# Patient Record
Sex: Male | Born: 1940 | Race: White | Hispanic: No | Marital: Married | Smoking: Never smoker
Health system: Southern US, Community
[De-identification: ages and names within clinical notes are randomized; demographics above are authoritative.]

## PROBLEM LIST (undated history)

## (undated) DIAGNOSIS — I2510 Atherosclerotic heart disease of native coronary artery without angina pectoris: Secondary | ICD-10-CM

## (undated) DIAGNOSIS — I447 Left bundle-branch block, unspecified: Secondary | ICD-10-CM

## (undated) DIAGNOSIS — I213 ST elevation (STEMI) myocardial infarction of unspecified site: Secondary | ICD-10-CM

## (undated) DIAGNOSIS — E785 Hyperlipidemia, unspecified: Secondary | ICD-10-CM

## (undated) DIAGNOSIS — E119 Type 2 diabetes mellitus without complications: Secondary | ICD-10-CM

## (undated) HISTORY — PX: NO PAST SURGERIES: SHX2092

## (undated) HISTORY — DX: Left bundle-branch block, unspecified: I44.7

---

## 1998-05-21 ENCOUNTER — Ambulatory Visit: Admission: RE | Admit: 1998-05-21 | Discharge: 1998-05-21 | Payer: Self-pay | Admitting: Internal Medicine

## 2011-12-29 DIAGNOSIS — L259 Unspecified contact dermatitis, unspecified cause: Secondary | ICD-10-CM | POA: Diagnosis not present

## 2012-01-14 DIAGNOSIS — J309 Allergic rhinitis, unspecified: Secondary | ICD-10-CM | POA: Diagnosis not present

## 2013-05-28 ENCOUNTER — Other Ambulatory Visit: Payer: Self-pay

## 2013-05-28 ENCOUNTER — Inpatient Hospital Stay (HOSPITAL_COMMUNITY)
Admission: EM | Admit: 2013-05-28 | Discharge: 2013-05-31 | DRG: 249 | Disposition: A | Discharge status: Home / Self Care | Payer: Medicare Other | Attending: Cardiovascular Disease | Admitting: Cardiovascular Disease

## 2013-05-28 ENCOUNTER — Encounter (HOSPITAL_COMMUNITY)
Admission: EM | Disposition: A | Discharge status: Home / Self Care | Payer: Self-pay | Source: Home / Self Care | Attending: Cardiovascular Disease

## 2013-05-28 ENCOUNTER — Encounter (HOSPITAL_COMMUNITY): Payer: Self-pay | Admitting: *Deleted

## 2013-05-28 ENCOUNTER — Ambulatory Visit (HOSPITAL_COMMUNITY): Admit: 2013-05-28 | Payer: Self-pay | Admitting: Cardiovascular Disease

## 2013-05-28 DIAGNOSIS — I739 Peripheral vascular disease, unspecified: Secondary | ICD-10-CM | POA: Diagnosis not present

## 2013-05-28 DIAGNOSIS — Z7982 Long term (current) use of aspirin: Secondary | ICD-10-CM | POA: Diagnosis not present

## 2013-05-28 DIAGNOSIS — Z23 Encounter for immunization: Secondary | ICD-10-CM

## 2013-05-28 DIAGNOSIS — E119 Type 2 diabetes mellitus without complications: Secondary | ICD-10-CM | POA: Diagnosis present

## 2013-05-28 DIAGNOSIS — Z7902 Long term (current) use of antithrombotics/antiplatelets: Secondary | ICD-10-CM | POA: Diagnosis not present

## 2013-05-28 DIAGNOSIS — I2510 Atherosclerotic heart disease of native coronary artery without angina pectoris: Secondary | ICD-10-CM | POA: Diagnosis not present

## 2013-05-28 DIAGNOSIS — I959 Hypotension, unspecified: Secondary | ICD-10-CM | POA: Diagnosis not present

## 2013-05-28 DIAGNOSIS — I447 Left bundle-branch block, unspecified: Secondary | ICD-10-CM | POA: Diagnosis not present

## 2013-05-28 DIAGNOSIS — I2119 ST elevation (STEMI) myocardial infarction involving other coronary artery of inferior wall: Principal | ICD-10-CM | POA: Diagnosis present

## 2013-05-28 DIAGNOSIS — I498 Other specified cardiac arrhythmias: Secondary | ICD-10-CM | POA: Diagnosis present

## 2013-05-28 DIAGNOSIS — Z79899 Other long term (current) drug therapy: Secondary | ICD-10-CM

## 2013-05-28 DIAGNOSIS — I219 Acute myocardial infarction, unspecified: Secondary | ICD-10-CM | POA: Diagnosis not present

## 2013-05-28 DIAGNOSIS — Z01810 Encounter for preprocedural cardiovascular examination: Secondary | ICD-10-CM | POA: Diagnosis not present

## 2013-05-28 DIAGNOSIS — R079 Chest pain, unspecified: Secondary | ICD-10-CM | POA: Diagnosis not present

## 2013-05-28 DIAGNOSIS — R739 Hyperglycemia, unspecified: Secondary | ICD-10-CM | POA: Diagnosis present

## 2013-05-28 DIAGNOSIS — E785 Hyperlipidemia, unspecified: Secondary | ICD-10-CM | POA: Diagnosis present

## 2013-05-28 DIAGNOSIS — I252 Old myocardial infarction: Secondary | ICD-10-CM | POA: Diagnosis present

## 2013-05-28 DIAGNOSIS — I213 ST elevation (STEMI) myocardial infarction of unspecified site: Secondary | ICD-10-CM

## 2013-05-28 DIAGNOSIS — Z951 Presence of aortocoronary bypass graft: Secondary | ICD-10-CM | POA: Diagnosis present

## 2013-05-28 HISTORY — DX: Type 2 diabetes mellitus without complications: E11.9

## 2013-05-28 HISTORY — PX: PERCUTANEOUS CORONARY STENT INTERVENTION (PCI-S): SHX5485

## 2013-05-28 HISTORY — DX: Hyperlipidemia, unspecified: E78.5

## 2013-05-28 HISTORY — PX: LEFT HEART CATH: SHX5478

## 2013-05-28 LAB — CBC WITH DIFFERENTIAL/PLATELET
Basophils Absolute: 0 10*3/uL (ref 0.0–0.1)
Eosinophils Absolute: 0 10*3/uL (ref 0.0–0.7)
Eosinophils Relative: 0 % (ref 0–5)
HCT: 42.2 % (ref 39.0–52.0)
Lymphocytes Relative: 11 % — ABNORMAL LOW (ref 12–46)
MCH: 31.6 pg (ref 26.0–34.0)
MCHC: 35.1 g/dL (ref 30.0–36.0)
MCV: 90.2 fL (ref 78.0–100.0)
Monocytes Absolute: 0.6 10*3/uL (ref 0.1–1.0)
RDW: 12.7 % (ref 11.5–15.5)
WBC: 15.8 10*3/uL — ABNORMAL HIGH (ref 4.0–10.5)

## 2013-05-28 LAB — BASIC METABOLIC PANEL
BUN: 11 mg/dL (ref 6–23)
CO2: 20 mEq/L (ref 19–32)
Glucose, Bld: 204 mg/dL — ABNORMAL HIGH (ref 70–99)
Potassium: 3.8 mEq/L (ref 3.5–5.1)
Sodium: 135 mEq/L (ref 135–145)

## 2013-05-28 LAB — POCT I-STAT, CHEM 8
BUN: 10 mg/dL (ref 6–23)
Calcium, Ion: 1.09 mmol/L — ABNORMAL LOW (ref 1.13–1.30)
Chloride: 101 mEq/L (ref 96–112)
Creatinine, Ser: 1.1 mg/dL (ref 0.50–1.35)
Glucose, Bld: 208 mg/dL — ABNORMAL HIGH (ref 70–99)
HCT: 51 % (ref 39.0–52.0)
Hemoglobin: 17.3 g/dL — ABNORMAL HIGH (ref 13.0–17.0)
Potassium: 3.7 mEq/L (ref 3.5–5.1)
Sodium: 140 mEq/L (ref 135–145)
TCO2: 21 mmol/L (ref 0–100)

## 2013-05-28 LAB — DIFFERENTIAL
Eosinophils Relative: 2 % (ref 0–5)
Lymphs Abs: 7.1 10*3/uL — ABNORMAL HIGH (ref 0.7–4.0)
Monocytes Absolute: 0.8 10*3/uL (ref 0.1–1.0)
Monocytes Relative: 6 % (ref 3–12)
Neutro Abs: 5.3 10*3/uL (ref 1.7–7.7)

## 2013-05-28 LAB — CBC
HCT: 48.1 % (ref 39.0–52.0)
MCH: 32.9 pg (ref 26.0–34.0)
MCV: 92 fL (ref 78.0–100.0)
RBC: 5.23 MIL/uL (ref 4.22–5.81)
RDW: 12.7 % (ref 11.5–15.5)
WBC: 13.5 10*3/uL — ABNORMAL HIGH (ref 4.0–10.5)

## 2013-05-28 LAB — APTT: aPTT: 27 seconds (ref 24–37)

## 2013-05-28 LAB — POCT I-STAT TROPONIN I: Troponin i, poc: 0 ng/mL (ref 0.00–0.08)

## 2013-05-28 SURGERY — LEFT HEART CATH
Anesthesia: LOCAL

## 2013-05-28 MED ORDER — TICAGRELOR 90 MG PO TABS: 90.0000 mg | ORAL_TABLET | Freq: Two times a day (BID) | ORAL | Status: DC

## 2013-05-28 MED ORDER — ONDANSETRON HCL 4 MG/2ML IJ SOLN: 4.0000 mg | Freq: Four times a day (QID) | INTRAMUSCULAR | Status: DC | PRN

## 2013-05-28 MED ORDER — TICAGRELOR 90 MG PO TABS
90.0000 mg | ORAL_TABLET | Freq: Two times a day (BID) | ORAL | Status: DC
  Filled 2013-05-28: qty 1

## 2013-05-28 MED ORDER — SODIUM CHLORIDE 0.9 % IV SOLN
INTRAVENOUS | Status: AC
  Administered 2013-05-28: 1000 mL via INTRAVENOUS

## 2013-05-28 MED ORDER — HEPARIN SODIUM (PORCINE) 5000 UNIT/ML IJ SOLN
5000.0000 [IU] | Freq: Three times a day (TID) | INTRAMUSCULAR | Status: DC
  Administered 2013-05-29 – 2013-05-31 (×8): 5000 [IU] via SUBCUTANEOUS
  Filled 2013-05-28 (×10): qty 1

## 2013-05-28 MED ORDER — METOPROLOL TARTRATE 12.5 MG HALF TABLET
12.5000 mg | ORAL_TABLET | Freq: Two times a day (BID) | ORAL | Status: DC
  Administered 2013-05-28 – 2013-05-31 (×6): 12.5 mg via ORAL
  Filled 2013-05-28 (×7): qty 1

## 2013-05-28 MED ORDER — ASPIRIN 81 MG PO CHEW: 81.0000 mg | CHEWABLE_TABLET | Freq: Every day | ORAL | Status: DC

## 2013-05-28 MED ORDER — ATORVASTATIN CALCIUM 80 MG PO TABS
80.0000 mg | ORAL_TABLET | Freq: Every day | ORAL | Status: DC
  Administered 2013-05-29 – 2013-05-30 (×2): 80 mg via ORAL
  Filled 2013-05-28 (×3): qty 1

## 2013-05-28 MED ORDER — NITROGLYCERIN IN D5W 200-5 MCG/ML-% IV SOLN: 2.0000 ug/min | INTRAVENOUS | Status: DC

## 2013-05-28 MED ORDER — HEPARIN SODIUM (PORCINE) 5000 UNIT/ML IJ SOLN
INTRAMUSCULAR | Status: AC
  Administered 2013-05-28: 4000 [IU]
  Filled 2013-05-28: qty 1

## 2013-05-28 MED ORDER — ATORVASTATIN CALCIUM 40 MG PO TABS: 40.0000 mg | ORAL_TABLET | Freq: Every day | ORAL | Status: DC

## 2013-05-28 MED ORDER — TICAGRELOR 90 MG PO TABS
ORAL_TABLET | ORAL | Status: AC
  Filled 2013-05-28: qty 2

## 2013-05-28 MED ORDER — ACETAMINOPHEN 325 MG PO TABS: 650.0000 mg | ORAL_TABLET | ORAL | Status: DC | PRN

## 2013-05-28 MED ORDER — ZOLPIDEM TARTRATE 5 MG PO TABS: 5.0000 mg | ORAL_TABLET | Freq: Every evening | ORAL | Status: DC | PRN

## 2013-05-28 MED ORDER — HEPARIN (PORCINE) IN NACL 2-0.9 UNIT/ML-% IJ SOLN
INTRAMUSCULAR | Status: AC
  Filled 2013-05-28: qty 1000

## 2013-05-28 MED ORDER — NITROGLYCERIN IN D5W 200-5 MCG/ML-% IV SOLN
INTRAVENOUS | Status: AC
  Filled 2013-05-28: qty 250

## 2013-05-28 MED ORDER — MORPHINE SULFATE 2 MG/ML IJ SOLN: 1.0000 mg | INTRAMUSCULAR | Status: DC | PRN

## 2013-05-28 MED ORDER — TICAGRELOR 90 MG PO TABS
90.0000 mg | ORAL_TABLET | Freq: Two times a day (BID) | ORAL | Status: DC
  Administered 2013-05-29 – 2013-05-31 (×5): 90 mg via ORAL
  Filled 2013-05-28 (×6): qty 1

## 2013-05-28 MED ORDER — SODIUM CHLORIDE 0.9 % IJ SOLN
3.0000 mL | Freq: Two times a day (BID) | INTRAMUSCULAR | Status: DC
  Administered 2013-05-28 – 2013-05-30 (×4): 3 mL via INTRAVENOUS
  Administered 2013-05-30: 09:00:00 via INTRAVENOUS

## 2013-05-28 MED ORDER — ZOLPIDEM TARTRATE 5 MG PO TABS
5.0000 mg | ORAL_TABLET | Freq: Every evening | ORAL | Status: DC | PRN
  Administered 2013-05-29 – 2013-05-30 (×2): 5 mg via ORAL
  Filled 2013-05-28 (×2): qty 1

## 2013-05-28 MED ORDER — LIDOCAINE HCL (PF) 1 % IJ SOLN
INTRAMUSCULAR | Status: AC
  Filled 2013-05-28: qty 30

## 2013-05-28 MED ORDER — NITROGLYCERIN 0.4 MG SL SUBL: 0.4000 mg | SUBLINGUAL_TABLET | SUBLINGUAL | Status: DC | PRN

## 2013-05-28 MED ORDER — SODIUM CHLORIDE 0.9 % IJ SOLN: 3.0000 mL | INTRAMUSCULAR | Status: DC | PRN

## 2013-05-28 MED ORDER — BIVALIRUDIN 250 MG IV SOLR
INTRAVENOUS | Status: AC
  Filled 2013-05-28: qty 250

## 2013-05-28 MED ORDER — ASPIRIN EC 81 MG PO TBEC
81.0000 mg | DELAYED_RELEASE_TABLET | Freq: Every day | ORAL | Status: DC
  Administered 2013-05-29 – 2013-05-31 (×3): 81 mg via ORAL
  Filled 2013-05-28 (×3): qty 1

## 2013-05-28 MED ORDER — SODIUM CHLORIDE 0.9 % IV SOLN: 250.0000 mL | INTRAVENOUS | Status: DC | PRN

## 2013-05-28 NOTE — CV Procedure (Addendum)
Steve Hickman is a 72 y.o. male    [id number] LOCATION:  FACILITY: [facility]  PHYSICIAN: Nanetta Batty, M.D. Jan 26, 1941   DATE OF PROCEDURE:  05/28/2013  DATE OF DISCHARGE:   CARDIAC CATHETERIZATION     History obtained from chart review. Steve Hickman is a 72 year old married Caucasian male with no prior cardiac history. He has no primary care physician nor is he on any medications. He developed substernal chest pain and hour prior to presentation to the hospital where he was found to have inferolateral ST segment elevation. A code STEMI was called and the patient was brought urgently to the cath lab for angiography and intervention.   PROCEDURE DESCRIPTION:    The patient was brought to the second floor [facility] lab in the postabsorptive state. He was not premedicated . His right groinwas prepped and shaved in usual sterile fashion. Xylocaine 1% was used for local anesthesia. A 6 French sheath was inserted into the right common femoral artery using standard Seldinger technique. 6 French right and left Judkins diagnostic catheters along with a 6 French pigtail catheter were used for selective coronary angiography, left ventriculography, subselective left internal mammary artery angiography and distal abdominal aortography. Visipaque dye was used for the entirety of the case. Retrograde aortic, left ventricular and pullback pressures were recorded.   HEMODYNAMICS:    AO SYSTOLIC/AO DIASTOLIC: 169/96   LV SYSTOLIC/LV DIASTOLIC: 166/24  ANGIOGRAPHIC RESULTS:   1. Left main; normal  2. LAD; tandem 95 stenoses in the proximal portion. This was a relatively small caliber vessel. 3. Left circumflex; codominant with a 99% thrombotic appearing stenosis in the proximal portion straddling the first moderate-sized obtuse marginal branch.  4. Right coronary artery; codominant with a 95% mid stenosis followed by a long 80% stenosis at the "crux". 5.LIMA TO LAD; subselectively visualized  and was widely patent. He was suitable for use during coronary artery bypass grafting if necessary 6. Left ventriculography; RAO left ventriculogram was performed using  25 mL of Visipaque dye at 12 mL/second. The overall LVEF estimated  60 %  Without wall motion abnormalities 7, distal abdominal aortography: Renal artery redrawing patent. 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 He did have TIMI 3 flow and was hemodynamically stable. He has high grade proximal segmental LAD disease as well as codominant RCA disease. I do not think his LAD extendable. I'm going to proceed with PCI and stenting of his proximal circumflex with bare-metal stent, aspirin, and Brilenta. Will perform aspiration thrombectomy first.  Procedure description: using a 6 French XB 3.5 cm guide catheter along with an 014/190 cm Asahi  pro-water guidewire and a 2.0 mm x 12 mm long balloon predilatation was performed. Following this a priority one aspiration thrombectomy device was used with multiple passes during which I aspirated the entirety of the thrombotic load. I then stented the lesion with a 3 mm x 15 mm long vision bare metal stent at 14 atmospheres (3.25 mm) resulting in reduction of a 95% stenosis to 0% residual with TIMI-3 flow. There was a "step up/step down" with preservation of the marginal branch which was encompassed within the stented segment. Because of this I decided not to post dilate.  Final impression: Successful PCI and stenting of proximal codominant circumflex with aspiration thrombectomy using bare-metal stent. The patient was treated with aspirin and Brilenta. Angiomax was reduced to 25 mg per kilogram  per 4 hours. The patient will ultimately need revascularization of the remaining 2 vessels optimally by bypass grafting.  Runell Gess MD,  [facility] 05/28/2013 9:48 PM

## 2013-05-28 NOTE — ED Notes (Signed)
Blood drawn and sent to lab.

## 2013-05-28 NOTE — ED Notes (Signed)
Wife at bedside. Pt ready for cath lab

## 2013-05-28 NOTE — ED Notes (Signed)
Procedure explained to pt by Dr. Allyson Sabal

## 2013-05-28 NOTE — ED Notes (Signed)
Vital signs stable. 

## 2013-05-28 NOTE — H&P (Addendum)
Admit date: 05/28/2013 Referring Physician EMS Primary Cardiologist NONE Chief complaint/reason for admission: Chest pain  HPI: This is a 72yo WM with no PMH, on no meds and has not seen any physician in years who presented to the ER with an inferior STEMI.  He started having chest pain after eating dinner and called EMS.  He was noted to have at least 4mm of ST elevation in the inferior leads.  In the ER he continued to complain of chest pain with persistent elevation in ST segments.  He has some transient bradycardia.    PMH:   No past medical history on file.  PSH:   No past surgical history on file.  ALLERGIES:   Review of patient's allergies indicates no known allergies.  Prior to Admit Meds:   No prescriptions prior to admission   Family HX:   No family history on file. Social HX:    History   Social History  . Marital Status: Single    Spouse Name: N/A    Number of Children: N/A  . Years of Education: N/A   Occupational History  . Not on file.   Social History Main Topics  . Smoking status: None  . Smokeless tobacco: Not on file  . Alcohol Use: Not on file  . Drug Use: Not on file  . Sexually Active: Not on file   Other Topics Concern  . Not on file   Social History Narrative  . No narrative on file     ROS:  All 11 ROS were addressed and are negative except what is stated in the HPI  PHYSICAL EXAM Filed Vitals:   05/28/13 2024  BP: 150/85  Pulse: 62  Temp:   Resp:    General: Well developed, well nourished, in no acute distress Head: Eyes PERRLA, No xanthomas.   Normal cephalic and atramatic  Lungs:   Clear bilaterally to auscultation and percussion. Heart:   HRRR S1 S2 Pulses are 2+ & equal.            No carotid bruit. No JVD.  No abdominal bruits. No femoral bruits. Abdomen: Bowel sounds are positive, abdomen soft and non-tender without masses Extremities:   No clubbing, cyanosis or edema.  DP +1 Neuro: Alert and oriented X 3. Psych:  Good affect,  responds appropriately   Labs:   Lab Results  Component Value Date   WBC 13.5* 05/28/2013   HGB 17.3* 05/28/2013   HCT 51.0 05/28/2013   MCV 92.0 05/28/2013   PLT 261 05/28/2013    Recent Labs Lab 05/28/13 2027  NA 140  K 3.7  CL 101  BUN 10  CREATININE 1.10  GLUCOSE 208*   No results found for this basename: CKTOTAL, CKMB, CKMBINDEX, TROPONINI   No results found for this basename: PTT   Lab Results  Component Value Date   INR 1.00 05/28/2013       Radiology: Pending  EKG:  NSR with 4 mm of ST elevation in the inferior leads  ASSESSMENT:  1.  Inferior STEMI  PLAN:   1.  Admit to CCU 2.  Emergent cath by Dr. Allyson Sabal 3.  ASA/ACE I/Statin 4.  Lopressor 12.5mg  BID 5.  2D echo in am 6.  Statin panel/HbgA1C  Quintella Reichert, MD  05/28/2013  9:04 PM

## 2013-05-28 NOTE — ED Notes (Signed)
Pt c/o worsening shob.  NBR in place

## 2013-05-28 NOTE — ED Notes (Signed)
Pt remains alert,  MD at bedside

## 2013-05-28 NOTE — ED Notes (Signed)
Pt states his pain is easing and that his shob is better at present time.  EKG repeated.

## 2013-05-28 NOTE — H&P (Signed)
    Pt was reexamined and existing H & P reviewed. No changes found.  Runell Gess, MD [facility] 05/28/2013 9:45 PM

## 2013-05-28 NOTE — ED Notes (Signed)
Pt alert and oriented on arrival to ED. Rates pain at 3/10. Has been given ASa 325mg  by EMS pta

## 2013-05-28 NOTE — ED Notes (Signed)
Pt to Cath lab via stretcher

## 2013-05-28 NOTE — ED Notes (Signed)
Wife to bedside. All pt belongings given to wife.

## 2013-05-28 NOTE — ED Notes (Signed)
Pt slight pale and diaphoretic. MD at bedside explaining Cardiac cath to pt. Pt in agreement for procedure

## 2013-05-28 NOTE — ED Notes (Signed)
Looking for pt family in waiting room

## 2013-05-28 NOTE — ED Notes (Signed)
Dr Berry to bedside.

## 2013-05-28 NOTE — ED Notes (Signed)
Belongings placed in bag Glasses and wallet in bag to give to pt

## 2013-05-28 NOTE — ED Notes (Signed)
Pt had synopal episode at home and c/o chest pressure.

## 2013-05-29 DIAGNOSIS — Z951 Presence of aortocoronary bypass graft: Secondary | ICD-10-CM

## 2013-05-29 DIAGNOSIS — I447 Left bundle-branch block, unspecified: Secondary | ICD-10-CM | POA: Diagnosis present

## 2013-05-29 DIAGNOSIS — R739 Hyperglycemia, unspecified: Secondary | ICD-10-CM | POA: Diagnosis present

## 2013-05-29 DIAGNOSIS — I2510 Atherosclerotic heart disease of native coronary artery without angina pectoris: Secondary | ICD-10-CM

## 2013-05-29 DIAGNOSIS — Z23 Encounter for immunization: Secondary | ICD-10-CM | POA: Diagnosis not present

## 2013-05-29 DIAGNOSIS — I2119 ST elevation (STEMI) myocardial infarction involving other coronary artery of inferior wall: Secondary | ICD-10-CM | POA: Diagnosis not present

## 2013-05-29 DIAGNOSIS — I252 Old myocardial infarction: Secondary | ICD-10-CM

## 2013-05-29 DIAGNOSIS — I959 Hypotension, unspecified: Secondary | ICD-10-CM | POA: Diagnosis not present

## 2013-05-29 DIAGNOSIS — I498 Other specified cardiac arrhythmias: Secondary | ICD-10-CM | POA: Diagnosis not present

## 2013-05-29 DIAGNOSIS — I219 Acute myocardial infarction, unspecified: Secondary | ICD-10-CM | POA: Diagnosis not present

## 2013-05-29 DIAGNOSIS — I213 ST elevation (STEMI) myocardial infarction of unspecified site: Secondary | ICD-10-CM

## 2013-05-29 HISTORY — DX: Old myocardial infarction: I25.2

## 2013-05-29 HISTORY — DX: Atherosclerotic heart disease of native coronary artery without angina pectoris: I25.10

## 2013-05-29 HISTORY — DX: ST elevation (STEMI) myocardial infarction of unspecified site: I21.3

## 2013-05-29 HISTORY — DX: Presence of aortocoronary bypass graft: Z95.1

## 2013-05-29 LAB — TROPONIN I
Troponin I: 2.56 ng/mL (ref <0.30)
Troponin I: 14.8 ng/mL (ref <0.30)
Troponin I: 9.94 ng/mL (ref <0.30)

## 2013-05-29 LAB — COMPREHENSIVE METABOLIC PANEL
Albumin: 3.7 g/dL (ref 3.5–5.2)
BUN: 11 mg/dL (ref 6–23)
CO2: 22 mEq/L (ref 19–32)
Calcium: 9.2 mg/dL (ref 8.4–10.5)
Chloride: 98 mEq/L (ref 96–112)
Creatinine, Ser: 0.82 mg/dL (ref 0.50–1.35)
GFR calc non Af Amer: 85 mL/min — ABNORMAL LOW (ref >90)
Total Bilirubin: 0.4 mg/dL (ref 0.3–1.2)

## 2013-05-29 LAB — BASIC METABOLIC PANEL
GFR calc Af Amer: >90 mL/min (ref >90)
GFR calc non Af Amer: 84 mL/min — ABNORMAL LOW (ref >90)
Potassium: 4.1 mEq/L (ref 3.5–5.1)
Sodium: 136 mEq/L (ref 135–145)

## 2013-05-29 LAB — HEMOGLOBIN A1C
Hgb A1c MFr Bld: 6.9 % — ABNORMAL HIGH (ref <5.7)
Mean Plasma Glucose: 151 mg/dL — ABNORMAL HIGH (ref <117)

## 2013-05-29 LAB — GLUCOSE, CAPILLARY
Glucose-Capillary: 146 mg/dL — ABNORMAL HIGH (ref 70–99)
Glucose-Capillary: 111 mg/dL — ABNORMAL HIGH (ref 70–99)
Glucose-Capillary: 157 mg/dL — ABNORMAL HIGH (ref 70–99)

## 2013-05-29 LAB — PROTIME-INR: Prothrombin Time: 20.8 seconds — ABNORMAL HIGH (ref 11.6–15.2)

## 2013-05-29 LAB — CBC
MCHC: 35 g/dL (ref 30.0–36.0)
RDW: 12.8 % (ref 11.5–15.5)

## 2013-05-29 LAB — MRSA PCR SCREENING: MRSA by PCR: NEGATIVE

## 2013-05-29 MED ORDER — TRAMADOL HCL 50 MG PO TABS: 50.0000 mg | ORAL_TABLET | Freq: Four times a day (QID) | ORAL | Status: DC | PRN

## 2013-05-29 MED ORDER — ISOSORBIDE MONONITRATE ER 30 MG PO TB24
30.0000 mg | ORAL_TABLET | Freq: Every day | ORAL | Status: DC
  Administered 2013-05-29 – 2013-05-31 (×3): 30 mg via ORAL
  Filled 2013-05-29 (×3): qty 1

## 2013-05-29 MED ORDER — INSULIN ASPART 100 UNIT/ML ~~LOC~~ SOLN
0.0000 [IU] | Freq: Three times a day (TID) | SUBCUTANEOUS | Status: DC
  Administered 2013-05-29: 1 [IU] via SUBCUTANEOUS
  Administered 2013-05-30: 2 [IU] via SUBCUTANEOUS
  Administered 2013-05-30 – 2013-05-31 (×4): 1 [IU] via SUBCUTANEOUS
  Administered 2013-05-31: 2 [IU] via SUBCUTANEOUS

## 2013-05-29 MED ORDER — ATROPINE SULFATE 1 MG/ML IJ SOLN
INTRAMUSCULAR | Status: AC
  Filled 2013-05-29: qty 1

## 2013-05-29 MED ORDER — INSULIN ASPART 100 UNIT/ML ~~LOC~~ SOLN: 0.0000 [IU] | Freq: Every day | SUBCUTANEOUS | Status: DC

## 2013-05-29 MED ORDER — PANTOPRAZOLE SODIUM 40 MG PO TBEC
40.0000 mg | DELAYED_RELEASE_TABLET | Freq: Every day | ORAL | Status: DC
  Administered 2013-05-29 – 2013-05-31 (×3): 40 mg via ORAL
  Filled 2013-05-29 (×3): qty 1

## 2013-05-29 NOTE — Progress Notes (Signed)
Pulled sheath from right groin at 0500. At 0508, patient moved causing groin to start bleeding heavily. BP dropped down to the 90's systolic. Hemostasis was achieved again and pressure was held for 30 more minutes. Right groin is soft and no hematoma is noted. Pressure dressing applied. No oozing noted at this time. Right groin is a level 0. Strict bedrest and post sheath removal instructions given to patient and he acknowledged understanding.

## 2013-05-29 NOTE — Progress Notes (Signed)
Subjective:  No chest pain  Objective:  Vital Signs in the last 24 hours: Temp:  [98 F (36.7 C)-98.8 F (37.1 C)] 98 F (36.7 C) (08/03 0730) Pulse Rate:  [62-88] 72 (08/03 0730) Resp:  [15-21] 18 (08/03 0730) BP: (113-162)/(67-98) 125/74 mmHg (08/03 0730) SpO2:  [96 %-100 %] 98 % (08/03 0730) Arterial Line BP: (124-178)/(68-95) 124/68 mmHg (08/03 0400) Weight:  [150 lb (68.04 kg)-154 lb 1.6 oz (69.9 kg)] 152 lb 8.9 oz (69.2 kg) (08/03 0600)  Intake/Output from previous day:  Intake/Output Summary (Last 24 hours) at 05/29/13 0921 Last data filed at 05/29/13 0700  Gross per 24 hour  Intake    652 ml  Output   1200 ml  Net   -548 ml    Physical Exam: General appearance: alert, cooperative and no distress Lungs: clear to auscultation bilaterally Heart: regular rate and rhythm Rt groin without hematoma   Rate: 72  Rhythm: normal sinus rhythm  Lab Results:  Recent Labs  05/28/13 2300 05/29/13 0400  WBC 15.8* 14.0*  HGB 14.8 14.1  PLT 248 251    Recent Labs  05/28/13 2300 05/29/13 0400  NA 132* 136  K 4.1 4.1  CL 98 103  CO2 22 23  GLUCOSE 223* 188*  BUN 11 11  CREATININE 0.82 0.85    Recent Labs  05/28/13 2300 05/29/13 0414  TROPONINI 2.56* 14.80*   Hepatic Function Panel  Recent Labs  05/28/13 2300  PROT 7.1  ALBUMIN 3.7  AST 33  ALT 30  ALKPHOS 77  BILITOT 0.4   No results found for this basename: CHOL,  in the last 72 hours  Recent Labs  05/28/13 2300  INR 1.85*    Imaging: No results found.  Cardiac Studies:  Assessment/Plan:   Principal Problem:   STEMI (ST elevation myocardial infarction)- CFX BMS Active Problems:   CAD (coronary artery disease)- residual CAD, needs CABG, (EF 60%)   Hyperglycemia   LBBB (left bundle branch block)    PLAN: Check Hgb A1C, sliding scale coverage, po Nitrates, check lipid panel in am. Check CXR tomorrow, notify CVTS tomorrow.   Corine Shelter PA-C [facility] [phone] 05/29/2013, 9:21  AM   Agree with note written by Corine Shelter [facility]  S/P STEMI, PCI/Stent LCX with BMS. Looks great!! No CP/SOB. Exam benign. Labs OK. Prob out to tele tomorrow. Will get TCTS to see in consult tomorrow. Staged elective CABG LAD/RCA after 4-6 weeks of DAPT.  Runell Gess 05/29/2013 3:44 PM

## 2013-05-30 ENCOUNTER — Inpatient Hospital Stay (HOSPITAL_COMMUNITY): Payer: Medicare Other

## 2013-05-30 ENCOUNTER — Other Ambulatory Visit: Payer: Self-pay | Admitting: *Deleted

## 2013-05-30 DIAGNOSIS — Z23 Encounter for immunization: Secondary | ICD-10-CM | POA: Diagnosis not present

## 2013-05-30 DIAGNOSIS — Z01810 Encounter for preprocedural cardiovascular examination: Secondary | ICD-10-CM

## 2013-05-30 DIAGNOSIS — I959 Hypotension, unspecified: Secondary | ICD-10-CM | POA: Diagnosis not present

## 2013-05-30 DIAGNOSIS — I2510 Atherosclerotic heart disease of native coronary artery without angina pectoris: Secondary | ICD-10-CM

## 2013-05-30 DIAGNOSIS — I498 Other specified cardiac arrhythmias: Secondary | ICD-10-CM | POA: Diagnosis not present

## 2013-05-30 DIAGNOSIS — I2119 ST elevation (STEMI) myocardial infarction involving other coronary artery of inferior wall: Secondary | ICD-10-CM | POA: Diagnosis not present

## 2013-05-30 DIAGNOSIS — I219 Acute myocardial infarction, unspecified: Secondary | ICD-10-CM | POA: Diagnosis not present

## 2013-05-30 DIAGNOSIS — I447 Left bundle-branch block, unspecified: Secondary | ICD-10-CM | POA: Diagnosis not present

## 2013-05-30 LAB — HEMOGLOBIN A1C
Hgb A1c MFr Bld: 7.1 % — ABNORMAL HIGH (ref <5.7)
Mean Plasma Glucose: 157 mg/dL — ABNORMAL HIGH (ref <117)

## 2013-05-30 LAB — GLUCOSE, CAPILLARY: Glucose-Capillary: 131 mg/dL — ABNORMAL HIGH (ref 70–99)

## 2013-05-30 LAB — BASIC METABOLIC PANEL
BUN: 12 mg/dL (ref 6–23)
CO2: 24 mEq/L (ref 19–32)
Calcium: 8.9 mg/dL (ref 8.4–10.5)
Chloride: 103 mEq/L (ref 96–112)
Creatinine, Ser: 0.98 mg/dL (ref 0.50–1.35)
GFR calc Af Amer: >90 mL/min (ref >90)
GFR calc non Af Amer: 79 mL/min — ABNORMAL LOW (ref >90)
Glucose, Bld: 139 mg/dL — ABNORMAL HIGH (ref 70–99)
Potassium: 3.8 mEq/L (ref 3.5–5.1)
Sodium: 137 mEq/L (ref 135–145)

## 2013-05-30 LAB — LIPID PANEL
Cholesterol: 138 mg/dL (ref 0–200)
HDL: 34 mg/dL — ABNORMAL LOW (ref >39)
LDL Cholesterol: 81 mg/dL (ref 0–99)
Total CHOL/HDL Ratio: 4.1 RATIO
Triglycerides: 116 mg/dL (ref <150)
VLDL: 23 mg/dL (ref 0–40)

## 2013-05-30 LAB — CBC
HCT: 37.6 % — ABNORMAL LOW (ref 39.0–52.0)
Hemoglobin: 13.1 g/dL (ref 13.0–17.0)
MCH: 32 pg (ref 26.0–34.0)
MCHC: 34.8 g/dL (ref 30.0–36.0)
MCV: 91.9 fL (ref 78.0–100.0)
Platelets: 224 10*3/uL (ref 150–400)
RBC: 4.09 MIL/uL — ABNORMAL LOW (ref 4.22–5.81)
RDW: 12.9 % (ref 11.5–15.5)
WBC: 11.6 10*3/uL — ABNORMAL HIGH (ref 4.0–10.5)

## 2013-05-30 LAB — PULMONARY FUNCTION TEST

## 2013-05-30 MED FILL — Sodium Chloride IV Soln 0.9%: INTRAVENOUS | Qty: 50 | Status: AC

## 2013-05-30 NOTE — Progress Notes (Signed)
CARDIAC REHAB PHASE I   PRE:  Rate/Rhythm: 81SR  BP:  Supine: 109/68  Sitting:   Standing:    SaO2:   MODE:  Ambulation: 350 ft   POST:  Rate/Rhythm: 91SR  BP:  Supine:   Sitting: 142/76  Standing:    SaO2:  1015-1115 Pt walked 350 ft on RA with steady gait. Denied CP. Tolerated well. To recliner after walk. Began MI education and gave pt diabetic and heart healthy diets. Pt stated he was told 30 years ago when he tried to get in the service that he was diabetic, but he never followed up.  Pt encouraged to watch videos about carb counting and I began ed. He could benefit from re enforcement. Gave brililnta and stent booklets. Will follow up tomorrow. Axle Parfait RNBSN   Luetta Nutting, RN BSN  05/30/2013 11:08 AM   101

## 2013-05-30 NOTE — Care Management Note (Addendum)
    Page 1 of 1   05/31/2013     10:07:07 AM   CARE MANAGEMENT NOTE 05/31/2013  Patient:  Steve Hickman, Steve Hickman   Account Number:  [id number]  Date Initiated:  05/30/2013  Documentation initiated by:  Junius Creamer  Subjective/Objective Assessment:   adm w mi     Action/Plan:   lives alone   Anticipated DC Date:     Anticipated DC Plan:        DC Planning Services  CM consult  Medication Assistance      Choice offered to / List presented to:             Status of service:   Medicare Important Message given?   (If response is "NO", the following Medicare IM given date fields will be blank) Date Medicare IM given:   Date Additional Medicare IM given:    Discharge Disposition:    Per UR Regulation:  Reviewed for med. necessity/level of care/duration of stay  If discussed at Long Length of Stay Meetings, dates discussed:    Comments:  8/5 1004 debbie Jazzelle Zhang rn,bsn spoke w pt. he [facility] [place]. he does not have ins for meds. he never signed up for medicare part d. left pt assist form for brilinta on chart to see if he can get thru drug company if needed.  8/4 1106a debbie Timber Marshman rn,bsn card rehab nse gave pt brilinta 30days free and copay assist card.

## 2013-05-30 NOTE — Consult Note (Signed)
Reason for Consult:3 vessel CAD, s/p STEMI, PTCA of circumflex Referring Physician: Dr. Nanetta Batty  Steve Hickman is an 72 y.o. male.  HPI: 72 yo presented with a cc/o "passing out."  72 yo male with no known significant PMH and no history of cardiac disease. He was in his usual state of health until Saturday. Shortly after eating dinner he developed a strange sensation in his chest and some discomfort in his arms. He says he felt dizzy and laid his head on the table then had a brief LOC. He was brought to the ED and found to have an inferior STEMI. 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 He had thrombus in the circumflex. Dr. Allyson Sabal performed angioplasty and placed a bare metal stent. He is currently on Brillinta.   He has not seen a doctor in many years. He says he was told in his 30s that his sugars were a little high but hasn't had any issues with until this admission. After promting from his wife, he says he has had a couple of episodes of chest discomfort while walking his dog. These resolved quickly with rest. He denies fatigue or decreased energy, but his wife says he has had both for about 9 months.  He has been pain free since cath/ PCI.  Past Medical History  Diagnosis Date  . Medical history non-contributory   Possible glucose intolerance  Past Surgical History  Procedure Laterality Date  . No past surgeries      History reviewed. No pertinent family history.  Social History:  reports that he has never smoked. He does not have any smokeless tobacco history on file. He reports that he drinks about 0.6 ounces of alcohol per week. His drug history is not on file.  Allergies: No Known Allergies  Medications:  Scheduled: . aspirin EC  81 mg Oral Daily  . atorvastatin  80 mg Oral q1800  . heparin  5,000 Units Subcutaneous Q8H  . insulin aspart  0-5 Units Subcutaneous QHS  . insulin aspart  0-9 Units  Subcutaneous TID WC  . isosorbide mononitrate  30 mg Oral Daily  . metoprolol tartrate  12.5 mg Oral BID  . pantoprazole  40 mg Oral Q0600  . sodium chloride  3 mL Intravenous Q12H  . Ticagrelor  90 mg Oral BID    Results for orders placed during the hospital encounter of 05/28/13 (from the past 48 hour(s))  CBC     Status: Abnormal   Collection Time    05/28/13  8:21 PM      Result Value Range   WBC 13.5 (*) 4.0 - 10.5 K/uL   RBC 5.23  4.22 - 5.81 MIL/uL   Hemoglobin 17.2 (*) 13.0 - 17.0 g/dL   HCT 16.1  09.6 - 04.5 %   MCV 92.0  78.0 - 100.0 fL   MCH 32.9  26.0 - 34.0 pg   MCHC 35.8  30.0 - 36.0 g/dL   RDW 40.9  81.1 - 91.4 %   Platelets 261  150 - 400 K/uL  DIFFERENTIAL     Status: Abnormal   Collection Time    05/28/13  8:21 PM      Result Value Range   Neutrophils Relative % 39 (*) 43 - 77 %   Lymphocytes Relative 53 (*) 12 - 46 %   Monocytes Relative 6  3 - 12 %  Eosinophils Relative 2  0 - 5 %   Basophils Relative 0  0 - 1 %   Neutro Abs 5.3  1.7 - 7.7 K/uL   Lymphs Abs 7.1 (*) 0.7 - 4.0 K/uL   Monocytes Absolute 0.8  0.1 - 1.0 K/uL   Eosinophils Absolute 0.3  0.0 - 0.7 K/uL   Basophils Absolute 0.0  0.0 - 0.1 K/uL  PROTIME-INR     Status: None   Collection Time    05/28/13  8:21 PM      Result Value Range   Prothrombin Time 13.0  11.6 - 15.2 seconds   INR 1.00  0.00 - 1.49  APTT     Status: None   Collection Time    05/28/13  8:21 PM      Result Value Range   aPTT 27  24 - 37 seconds  BASIC METABOLIC PANEL     Status: Abnormal   Collection Time    05/28/13  8:21 PM      Result Value Range   Sodium 135  135 - 145 mEq/L   Potassium 3.8  3.5 - 5.1 mEq/L   Chloride 96  96 - 112 mEq/L   CO2 20  19 - 32 mEq/L   Glucose, Bld 204 (*) 70 - 99 mg/dL   BUN 11  6 - 23 mg/dL   Creatinine, Ser 1.19  0.50 - 1.35 mg/dL   Calcium 14.7  8.4 - 82.9 mg/dL   GFR calc non Af Amer 69 (*) >90 mL/min   GFR calc Af Amer 81 (*) >90 mL/min   Comment:            The eGFR has  been calculated     using the CKD EPI equation.     This calculation has not been     validated in all clinical     situations.     eGFR's persistently     <90 mL/min signify     possible Chronic Kidney Disease.  POCT I-STAT TROPONIN I     Status: None   Collection Time    05/28/13  8:24 PM      Result Value Range   Troponin i, poc 0.00  0.00 - 0.08 ng/mL   Comment 3            Comment: Due to the release kinetics of cTnI,     a negative result within the first hours     of the onset of symptoms does not rule out     myocardial infarction with certainty.     If myocardial infarction is still suspected,     repeat the test at appropriate intervals.  POCT I-STAT, CHEM 8     Status: Abnormal   Collection Time    05/28/13  8:27 PM      Result Value Range   Sodium 140  135 - 145 mEq/L   Potassium 3.7  3.5 - 5.1 mEq/L   Chloride 101  96 - 112 mEq/L   BUN 10  6 - 23 mg/dL   Creatinine, Ser 5.62  0.50 - 1.35 mg/dL   Glucose, Bld 130 (*) 70 - 99 mg/dL   Calcium, Ion 8.65 (*) 1.13 - 1.30 mmol/L   TCO2 21  0 - 100 mmol/L   Hemoglobin 17.3 (*) 13.0 - 17.0 g/dL   HCT 78.4  69.6 - 29.5 %  MRSA PCR SCREENING     Status: None   Collection Time  05/28/13 10:30 PM      Result Value Range   MRSA by PCR NEGATIVE  NEGATIVE   Comment:            The GeneXpert MRSA Assay (FDA     approved for NASAL specimens     only), is one component of a     comprehensive MRSA colonization     surveillance program. It is not     intended to diagnose MRSA     infection nor to guide or     monitor treatment for     MRSA infections.  TROPONIN I     Status: Abnormal   Collection Time    05/28/13 11:00 PM      Result Value Range   Troponin I 2.56 (*) <0.30 ng/mL   Comment:            Due to the release kinetics of cTnI,     a negative result within the first hours     of the onset of symptoms does not rule out     myocardial infarction with certainty.     If myocardial infarction is still  suspected,     repeat the test at appropriate intervals.     CRITICAL RESULT CALLED TO, READ BACK BY AND VERIFIED WITH:     Berdine Addison ON [id number] AT [phone]  BY [facility]  PROTIME-INR     Status: Abnormal   Collection Time    05/28/13 11:00 PM      Result Value Range   Prothrombin Time 20.8 (*) 11.6 - 15.2 seconds   INR 1.85 (*) 0.00 - 1.49  APTT     Status: Abnormal   Collection Time    05/28/13 11:00 PM      Result Value Range   aPTT 100 (*) 24 - 37 seconds   Comment:            IF BASELINE aPTT IS ELEVATED,     SUGGEST PATIENT RISK ASSESSMENT     BE USED TO DETERMINE APPROPRIATE     ANTICOAGULANT THERAPY.  CBC WITH DIFFERENTIAL     Status: Abnormal   Collection Time    05/28/13 11:00 PM      Result Value Range   WBC 15.8 (*) 4.0 - 10.5 K/uL   RBC 4.68  4.22 - 5.81 MIL/uL   Hemoglobin 14.8  13.0 - 17.0 g/dL   HCT 91.4  78.2 - 95.6 %   MCV 90.2  78.0 - 100.0 fL   MCH 31.6  26.0 - 34.0 pg   MCHC 35.1  30.0 - 36.0 g/dL   RDW 21.3  08.6 - 57.8 %   Platelets 248  150 - 400 K/uL   Neutrophils Relative % 85 (*) 43 - 77 %   Neutro Abs 13.5 (*) 1.7 - 7.7 K/uL   Lymphocytes Relative 11 (*) 12 - 46 %   Lymphs Abs 1.7  0.7 - 4.0 K/uL   Monocytes Relative 4  3 - 12 %   Monocytes Absolute 0.6  0.1 - 1.0 K/uL   Eosinophils Relative 0  0 - 5 %   Eosinophils Absolute 0.0  0.0 - 0.7 K/uL   Basophils Relative 0  0 - 1 %   Basophils Absolute 0.0  0.0 - 0.1 K/uL  TSH     Status: None   Collection Time    05/28/13 11:00 PM      Result Value Range   TSH 1.900  0.350 - 4.500 uIU/mL  COMPREHENSIVE METABOLIC PANEL     Status: Abnormal   Collection Time    05/28/13 11:00 PM      Result Value Range   Sodium 132 (*) 135 - 145 mEq/L   Potassium 4.1  3.5 - 5.1 mEq/L   Chloride 98  96 - 112 mEq/L   CO2 22  19 - 32 mEq/L   Glucose, Bld 223 (*) 70 - 99 mg/dL   BUN 11  6 - 23 mg/dL   Creatinine, Ser 1.61  0.50 - 1.35 mg/dL   Calcium 9.2  8.4 - 09.6 mg/dL   Total Protein 7.1  6.0 - 8.3 g/dL    Albumin 3.7  3.5 - 5.2 g/dL   AST 33  0 - 37 U/L   ALT 30  0 - 53 U/L   Alkaline Phosphatase 77  39 - 117 U/L   Total Bilirubin 0.4  0.3 - 1.2 mg/dL   GFR calc non Af Amer 85 (*) >90 mL/min   GFR calc Af Amer >90  >90 mL/min   Comment:            The eGFR has been calculated     using the CKD EPI equation.     This calculation has not been     validated in all clinical     situations.     eGFR's persistently     <90 mL/min signify     possible Chronic Kidney Disease.  HEMOGLOBIN A1C     Status: Abnormal   Collection Time    05/28/13 11:00 PM      Result Value Range   Hemoglobin A1C 6.9 (*) <5.7 %   Comment: (NOTE)                                                                               According to the ADA Clinical Practice Recommendations for 2011, when     HbA1c is used as a screening test:      >=6.5%   Diagnostic of Diabetes Mellitus               (if abnormal result is confirmed)     5.7-6.4%   Increased risk of developing Diabetes Mellitus     References:Diagnosis and Classification of Diabetes Mellitus,Diabetes     Care,2011,34(Suppl 1):S62-S69 and Standards of Medical Care in             Diabetes - 2011,Diabetes Care,2011,34 (Suppl 1):S11-S61.   Mean Plasma Glucose 151 (*) <117 mg/dL  CBC     Status: Abnormal   Collection Time    05/29/13  4:00 AM      Result Value Range   WBC 14.0 (*) 4.0 - 10.5 K/uL   RBC 4.41  4.22 - 5.81 MIL/uL   Hemoglobin 14.1  13.0 - 17.0 g/dL   HCT 04.5  40.9 - 81.1 %   MCV 91.4  78.0 - 100.0 fL   MCH 32.0  26.0 - 34.0 pg   MCHC 35.0  30.0 - 36.0 g/dL   RDW 91.4  78.2 - 95.6 %   Platelets 251  150 - 400 K/uL  BASIC METABOLIC PANEL  Status: Abnormal   Collection Time    05/29/13  4:00 AM      Result Value Range   Sodium 136  135 - 145 mEq/L   Potassium 4.1  3.5 - 5.1 mEq/L   Chloride 103  96 - 112 mEq/L   CO2 23  19 - 32 mEq/L   Glucose, Bld 188 (*) 70 - 99 mg/dL   BUN 11  6 - 23 mg/dL   Creatinine, Ser 1.61  0.50 -  1.35 mg/dL   Calcium 9.0  8.4 - 09.6 mg/dL   GFR calc non Af Amer 84 (*) >90 mL/min   GFR calc Af Amer >90  >90 mL/min   Comment:            The eGFR has been calculated     using the CKD EPI equation.     This calculation has not been     validated in all clinical     situations.     eGFR's persistently     <90 mL/min signify     possible Chronic Kidney Disease.  TROPONIN I     Status: Abnormal   Collection Time    05/29/13  4:14 AM      Result Value Range   Troponin I 14.80 (*) <0.30 ng/mL   Comment:            Due to the release kinetics of cTnI,     a negative result within the first hours     of the onset of symptoms does not rule out     myocardial infarction with certainty.     If myocardial infarction is still suspected,     repeat the test at appropriate intervals.     CRITICAL VALUE NOTED.  VALUE IS CONSISTENT WITH PREVIOUSLY REPORTED AND CALLED VALUE.  TROPONIN I     Status: Abnormal   Collection Time    05/29/13 11:55 AM      Result Value Range   Troponin I 9.94 (*) <0.30 ng/mL   Comment:            Due to the release kinetics of cTnI,     a negative result within the first hours     of the onset of symptoms does not rule out     myocardial infarction with certainty.     If myocardial infarction is still suspected,     repeat the test at appropriate intervals.     CRITICAL VALUE NOTED.  VALUE IS CONSISTENT WITH PREVIOUSLY REPORTED AND CALLED VALUE.  GLUCOSE, CAPILLARY     Status: Abnormal   Collection Time    05/29/13 12:33 PM      Result Value Range   Glucose-Capillary 146 (*) 70 - 99 mg/dL  GLUCOSE, CAPILLARY     Status: Abnormal   Collection Time    05/29/13  4:59 PM      Result Value Range   Glucose-Capillary 111 (*) 70 - 99 mg/dL  GLUCOSE, CAPILLARY     Status: Abnormal   Collection Time    05/29/13  9:46 PM      Result Value Range   Glucose-Capillary 157 (*) 70 - 99 mg/dL  HEMOGLOBIN E4V     Status: Abnormal   Collection Time    05/30/13  4:50  AM      Result Value Range   Hemoglobin A1C 7.1 (*) <5.7 %   Comment: (NOTE)  According to the ADA Clinical Practice Recommendations for 2011, when     HbA1c is used as a screening test:      >=6.5%   Diagnostic of Diabetes Mellitus               (if abnormal result is confirmed)     5.7-6.4%   Increased risk of developing Diabetes Mellitus     References:Diagnosis and Classification of Diabetes Mellitus,Diabetes     Care,2011,34(Suppl 1):S62-S69 and Standards of Medical Care in             Diabetes - 2011,Diabetes Care,2011,34 (Suppl 1):S11-S61.   Mean Plasma Glucose 157 (*) <117 mg/dL  BASIC METABOLIC PANEL     Status: Abnormal   Collection Time    05/30/13  4:50 AM      Result Value Range   Sodium 137  135 - 145 mEq/L   Potassium 3.8  3.5 - 5.1 mEq/L   Chloride 103  96 - 112 mEq/L   CO2 24  19 - 32 mEq/L   Glucose, Bld 139 (*) 70 - 99 mg/dL   BUN 12  6 - 23 mg/dL   Creatinine, Ser 1.61  0.50 - 1.35 mg/dL   Calcium 8.9  8.4 - 09.6 mg/dL   GFR calc non Af Amer 79 (*) >90 mL/min   GFR calc Af Amer >90  >90 mL/min   Comment:            The eGFR has been calculated     using the CKD EPI equation.     This calculation has not been     validated in all clinical     situations.     eGFR's persistently     <90 mL/min signify     possible Chronic Kidney Disease.  CBC     Status: Abnormal   Collection Time    05/30/13  4:50 AM      Result Value Range   WBC 11.6 (*) 4.0 - 10.5 K/uL   RBC 4.09 (*) 4.22 - 5.81 MIL/uL   Hemoglobin 13.1  13.0 - 17.0 g/dL   HCT 04.5 (*) 40.9 - 81.1 %   MCV 91.9  78.0 - 100.0 fL   MCH 32.0  26.0 - 34.0 pg   MCHC 34.8  30.0 - 36.0 g/dL   RDW 91.4  78.2 - 95.6 %   Platelets 224  150 - 400 K/uL  LIPID PANEL     Status: Abnormal   Collection Time    05/30/13  4:50 AM      Result Value Range   Cholesterol 138  0 - 200 mg/dL   Triglycerides 213  <086 mg/dL   HDL 34 (*) >57  mg/dL   Total CHOL/HDL Ratio 4.1     VLDL 23  0 - 40 mg/dL   LDL Cholesterol 81  0 - 99 mg/dL   Comment:            Total Cholesterol/HDL:CHD Risk     Coronary Heart Disease Risk Table                         Men   Women      1/2 Average Risk   3.4   3.3      Average Risk       5.0   4.4      2 X Average Risk   9.6   7.1  3 X Average Risk  23.4   11.0                Use the calculated Patient Ratio     above and the CHD Risk Table     to determine the patient's CHD Risk.                ATP III CLASSIFICATION (LDL):      <100     mg/dL   Optimal      960-454  mg/dL   Near or Above                        Optimal      130-159  mg/dL   Borderline      098-119  mg/dL   High      >147     mg/dL   Very High  GLUCOSE, CAPILLARY     Status: Abnormal   Collection Time    05/30/13  7:41 AM      Result Value Range   Glucose-Capillary 131 (*) 70 - 99 mg/dL   Comment 1 Notify RN    GLUCOSE, CAPILLARY     Status: Abnormal   Collection Time    05/30/13 11:49 AM      Result Value Range   Glucose-Capillary 170 (*) 70 - 99 mg/dL   Comment 1 Notify RN      Dg Chest 2 View  05/30/2013   *RADIOLOGY REPORT*  Clinical Data: Myocardial infarction  CHEST - 2 VIEW  Comparison:  None.  Findings:  The heart size and mediastinal contours are within normal limits.  Both lungs are clear.  The visualized skeletal structures are unremarkable.  IMPRESSION: No active cardiopulmonary disease.   Original Report Authenticated By: Christiana Pellant, M.D.    Review of Systems  Constitutional: Positive for malaise/fatigue. Negative for fever and chills.  Respiratory: Positive for shortness of breath. Negative for cough.   Cardiovascular: Positive for chest pain. Negative for orthopnea and claudication.  Gastrointestinal: Negative for heartburn, nausea and vomiting.  Neurological: Negative.  Negative for weakness.  Endo/Heme/Allergies: Negative.   All other systems reviewed and are negative.   Blood  pressure 102/57, pulse 89, temperature 97.9 F (36.6 C), temperature source Oral, resp. rate 18, height 5\' 7"  (1.702 m), weight 149 lb 14.6 oz (68 kg), SpO2 94.00%. Physical Exam  Vitals reviewed. Constitutional: He appears well-developed and well-nourished. No distress.  HENT:  Head: Normocephalic and atraumatic.  Poor dentition  Eyes: EOM are normal. Pupils are equal, round, and reactive to light.  Neck: Neck supple. No thyromegaly present.  Cardiovascular: Normal rate, regular rhythm, normal heart sounds and intact distal pulses.  Exam reveals no gallop and no friction rub.   No murmur heard. Respiratory: Effort normal and breath sounds normal. He has no wheezes. He has no rales.  GI: Soft. There is no tenderness.  Musculoskeletal: He exhibits no edema.  Neurological: No cranial nerve deficit.  Skin: Skin is warm and dry.    CARDIAC CATHETERIZATION HEMODYNAMICS:  AO SYSTOLIC/AO DIASTOLIC: 169/96  LV SYSTOLIC/LV DIASTOLIC: 166/24  ANGIOGRAPHIC RESULTS:  1. Left main; normal  2. LAD; tandem 95 stenoses in the proximal portion. This was a relatively small caliber vessel.  3. Left circumflex; codominant with a 99% thrombotic appearing stenosis in the proximal portion straddling the first moderate-sized obtuse marginal branch.  4. Right coronary artery; codominant with a 95% mid stenosis followed by a long 80%  stenosis at the "crux".  5.LIMA TO LAD; subselectively visualized and was widely patent. He was suitable for use during coronary artery bypass grafting if necessary  6. Left ventriculography; RAO left ventriculogram was performed using  25 mL of Visipaque dye at 12 mL/second. The overall LVEF estimated  60 % Without wall motion abnormalities  7, distal abdominal aortography: Renal artery redrawing patent. The infrarenal abdominal aorta and iliac bifurcation appear free of significant atherosclerotic changes  IMPRESSION:Mr. Diaz has a high-grade subtotal thrombotic proximal  codominant circumflex. He did have TIMI 3 flow and was hemodynamically stable. He has high grade proximal segmental LAD disease as well as codominant RCA disease. I do not think his LAD extendable. I'm going to proceed with PCI and stenting of his proximal circumflex with bare-metal stent, aspirin, and Brilenta. Will perform aspiration thrombectomy first.     Assessment/Plan: 72 yo WM with no prior cardiac history presented on 8/2 with a STEMI. He had a thrombus in the circumflex which was treated with a bare metal stent. He still has preserved LV function. He has residual hemodynamically significant disease in the LAD and the co-dominate RCA. I agree with Dr. Allyson Sabal that these vessels would best be treated with CABG once it is safe to temporarily hold the Brillinta.  I have discussed with the patient and his wife the general nature of the procedure, the need for general anesthesia, and the incisions to be used. We discussed the expected hospital stay, overall recovery and short and long term outcomes. They understand the risks include, but are not limited to, death, stroke, MI, DVT/PE, bleeding, possible need for transfusion, infections, cardiac arrhythmias, and other organ system dysfunction including respiratory, renal, or GI complications. He accepts these risks and agrees to proceed with CABG after interval recovery from his MI.  I will arrange followup in my office in 3-4 weeks to meet with them again in case they have any questions.  Amera Banos C 05/30/2013, 2:39 PM

## 2013-05-30 NOTE — Progress Notes (Signed)
Subjective: No further CP.  No SOB.  Objective: Vital signs in last 24 hours: Temp:  [97.3 F (36.3 C)-98.8 F (37.1 C)] 97.3 F (36.3 C) (08/04 0738) Pulse Rate:  [69-79] 74 (08/04 0410) Resp:  [18-19] 18 (08/04 0738) BP: (97-141)/(57-82) 102/57 mmHg (08/04 0410) SpO2:  [92 %-98 %] 94 % (08/04 0738) Weight:  [149 lb 14.6 oz (68 kg)] 149 lb 14.6 oz (68 kg) (08/04 0500) Last BM Date: 05/29/13  Intake/Output from previous day: 08/03 0701 - 08/04 0700 In: 240 [P.O.:240] Out: -  Intake/Output this shift:    Medications Current Facility-Administered Medications  Medication Dose Route Frequency Provider Last Rate Last Dose  . 0.9 %  sodium chloride infusion  250 mL Intravenous PRN Quintella Reichert, MD      . acetaminophen (TYLENOL) tablet 650 mg  650 mg Oral Q4H PRN Quintella Reichert, MD      . acetaminophen (TYLENOL) tablet 650 mg  650 mg Oral Q4H PRN Runell Gess, MD      . aspirin EC tablet 81 mg  81 mg Oral Daily Quintella Reichert, MD   81 mg at 05/29/13 1001  . atorvastatin (LIPITOR) tablet 80 mg  80 mg Oral q1800 Runell Gess, MD   80 mg at 05/29/13 1722  . heparin injection 5,000 Units  5,000 Units Subcutaneous Q8H Quintella Reichert, MD   5,000 Units at 05/30/13 [phone]  . insulin aspart (novoLOG) injection 0-5 Units  0-5 Units Subcutaneous QHS Luke K Kilroy, PA-C      . insulin aspart (novoLOG) injection 0-9 Units  0-9 Units Subcutaneous TID WC Abelino Derrick, PA-C   1 Units at 05/29/13 1243  . isosorbide mononitrate (IMDUR) 24 hr tablet 30 mg  30 mg Oral Daily Abelino Derrick, PA-C   30 mg at 05/29/13 1243  . metoprolol tartrate (LOPRESSOR) tablet 12.5 mg  12.5 mg Oral BID Quintella Reichert, MD   12.5 mg at 05/29/13 2139  . morphine 2 MG/ML injection 1 mg  1 mg Intravenous Q1H PRN Runell Gess, MD      . nitroGLYCERIN (NITROSTAT) SL tablet 0.4 mg  0.4 mg Sublingual Q5 Min x 3 PRN Quintella Reichert, MD      . ondansetron (ZOFRAN) injection 4 mg  4 mg Intravenous Q6H PRN Quintella Reichert, MD      . ondansetron (ZOFRAN) injection 4 mg  4 mg Intravenous Q6H PRN Runell Gess, MD      . pantoprazole (PROTONIX) EC tablet 40 mg  40 mg Oral Q0600 Abelino Derrick, PA-C   40 mg at 05/30/13 9604  . sodium chloride 0.9 % injection 3 mL  3 mL Intravenous Q12H Quintella Reichert, MD   3 mL at 05/29/13 2140  . sodium chloride 0.9 % injection 3 mL  3 mL Intravenous PRN Quintella Reichert, MD      . Ticagrelor (BRILINTA) tablet 90 mg  90 mg Oral BID Runell Gess, MD   90 mg at 05/29/13 2136  . traMADol (ULTRAM) tablet 50 mg  50 mg Oral Q6H PRN Abelino Derrick, PA-C      . zolpidem (AMBIEN) tablet 5 mg  5 mg Oral QHS PRN Runell Gess, MD   5 mg at 05/29/13 2143    PE: General appearance: alert, cooperative and no distress Lungs: clear to auscultation bilaterally Heart: regular rate and rhythm, S1, S2 normal, no murmur, click, rub  or gallop Extremities: No LEE Pulses: 2+ and symmetric Skin: Right goir:  Nontender, small hematoma, small area of ecchymosis. Neurologic: Grossly normal  Lab Results:   Recent Labs  05/28/13 2300 05/29/13 0400 05/30/13 0450  WBC 15.8* 14.0* 11.6*  HGB 14.8 14.1 13.1  HCT 42.2 40.3 37.6*  PLT 248 251 224   BMET  Recent Labs  05/28/13 2300 05/29/13 0400 05/30/13 0450  NA 132* 136 137  K 4.1 4.1 3.8  CL 98 103 103  CO2 22 23 24   GLUCOSE 223* 188* 139*  BUN 11 11 12   CREATININE 0.82 0.85 0.98  CALCIUM 9.2 9.0 8.9   PT/INR  Recent Labs  05/28/13 2021 05/28/13 2300  LABPROT 13.0 20.8*  INR 1.00 1.85*   Cholesterol  Recent Labs  05/30/13 0450  CHOL 138   Lipid Panel     Component Value Date/Time   CHOL 138 05/30/2013 0450   TRIG 116 05/30/2013 0450   HDL 34* 05/30/2013 0450   CHOLHDL 4.1 05/30/2013 0450   VLDL 23 05/30/2013 0450   LDLCALC 81 05/30/2013 0450    Cardiac Panel (last 3 results)  Recent Labs  05/28/13 2300 05/29/13 0414 05/29/13 1155  TROPONINI 2.56* 14.80* 9.94*     Assessment/Plan  Principal Problem:    STEMI (ST elevation myocardial infarction)- CFX BMS Active Problems:   CAD (coronary artery disease)- residual CAD, needs CABG, (EF 60%)   LBBB (left bundle branch block)   Hyperglycemia  Plan:  SP STEMI with BMS to the CFX.  TCTS to consult for staged elective CABG LAD/RCA after 4-6 weeks of DAPT.  BP and HR stable.  Troponin trending down.  SCr WNL.  He appears to have an paroxysmal LBBB.     LOS: 2 days    HAGER, BRYAN 05/30/2013 8:10 AM  I have seen and examined the patient along with Wilburt Finlay, PA.  I have reviewed the chart, notes and new data.  I agree with PA's note.  Key new complaints: no angina or dyspnea Key examination changes: no clinical signs of CHF, no arrhythmia   PLAN: He is discussing CABG option with Dr. Dorris Fetch right now. This will be delayed for 30 days while bare metal stent endothelializes. May be ready for discharge tomorrow. Carotid Dopplers have been ordered. If not ready by AM, will reschedule as an outpatient.  Thurmon Fair, MD, [facility] [phone] 05/30/2013, 2:02 PM

## 2013-05-30 NOTE — Progress Notes (Addendum)
VASCULAR LAB PRELIMINARY  PRELIMINARY  PRELIMINARY  PRELIMINARY  Pre-op Cardiac Surgery  Carotid Findings:  Bilateral:  1-39% ICA stenosis.  Vertebral artery flow is antegrade.    Thereasa Parkin, RVT 05/30/2013 4:52 PM      Upper Extremity Right Left  Brachial Pressures 148  Triphasic  130  Triphasic   Radial Waveforms Triphasic  Triphasic   Ulnar Waveforms Triphasic  Triphasic   Palmar Arch (Allen's Test) Within normal limits  Within normal limits     Lower  Extremity Right Left  Dorsalis Pedis Triphasic  Monophasic   Anterior Tibial    Posterior Tibial Triphasic  Triphasic   Ankle/Brachial Indices 1.19 1.2     Thereasa Parkin, RVT 05/31/2013 9:17 AM

## 2013-05-31 ENCOUNTER — Encounter (HOSPITAL_COMMUNITY): Payer: Self-pay | Admitting: Cardiology

## 2013-05-31 DIAGNOSIS — I2510 Atherosclerotic heart disease of native coronary artery without angina pectoris: Secondary | ICD-10-CM

## 2013-05-31 DIAGNOSIS — I447 Left bundle-branch block, unspecified: Secondary | ICD-10-CM

## 2013-05-31 DIAGNOSIS — E119 Type 2 diabetes mellitus without complications: Secondary | ICD-10-CM

## 2013-05-31 DIAGNOSIS — E785 Hyperlipidemia, unspecified: Secondary | ICD-10-CM

## 2013-05-31 DIAGNOSIS — I219 Acute myocardial infarction, unspecified: Secondary | ICD-10-CM | POA: Diagnosis not present

## 2013-05-31 DIAGNOSIS — Z01810 Encounter for preprocedural cardiovascular examination: Secondary | ICD-10-CM | POA: Diagnosis not present

## 2013-05-31 HISTORY — DX: Type 2 diabetes mellitus without complications: E11.9

## 2013-05-31 HISTORY — DX: Hyperlipidemia, unspecified: E78.5

## 2013-05-31 LAB — GLUCOSE, CAPILLARY: Glucose-Capillary: 195 mg/dL — ABNORMAL HIGH (ref 70–99)

## 2013-05-31 MED ORDER — LIVING WELL WITH DIABETES BOOK
Freq: Once | Status: AC
  Administered 2013-05-31: 10:00:00
  Filled 2013-05-31: qty 1

## 2013-05-31 MED ORDER — ACETAMINOPHEN 325 MG PO TABS: 650.0000 mg | ORAL_TABLET | ORAL | Status: DC | PRN

## 2013-05-31 MED ORDER — PANTOPRAZOLE SODIUM 40 MG PO TBEC: 40.0000 mg | DELAYED_RELEASE_TABLET | Freq: Every day | ORAL | Status: DC

## 2013-05-31 MED ORDER — ISOSORBIDE MONONITRATE ER 30 MG PO TB24: 30.0000 mg | ORAL_TABLET | Freq: Every day | ORAL | Status: DC

## 2013-05-31 MED ORDER — NITROGLYCERIN 0.4 MG SL SUBL: 0.4000 mg | SUBLINGUAL_TABLET | SUBLINGUAL | Status: DC | PRN

## 2013-05-31 MED ORDER — TICAGRELOR 90 MG PO TABS: 90.0000 mg | ORAL_TABLET | Freq: Two times a day (BID) | ORAL | Status: DC

## 2013-05-31 MED ORDER — METOPROLOL TARTRATE 25 MG PO TABS: 12.5000 mg | ORAL_TABLET | Freq: Two times a day (BID) | ORAL | Status: DC

## 2013-05-31 MED ORDER — ATORVASTATIN CALCIUM 80 MG PO TABS: 80.0000 mg | ORAL_TABLET | Freq: Every day | ORAL | Status: DC

## 2013-05-31 MED ORDER — ASPIRIN 81 MG PO TBEC: 81.0000 mg | DELAYED_RELEASE_TABLET | Freq: Every day | ORAL | Status: DC

## 2013-05-31 NOTE — Progress Notes (Signed)
Feels well this AM  BP 108/60  Pulse 71  Temp(Src) 98.6 F (37 C) (Oral)  Resp 20  Ht 5\' 7"  (1.702 m)  Wt 149 lb 14.6 oz (68 kg)  BMI 23.47 kg/m2  SpO2 98%   Intake/Output Summary (Last 24 hours) at 05/31/13 1610 Last data filed at 05/30/13 2200  Gross per 24 hour  Intake    740 ml  Output      0 ml  Net    740 ml    Exam unchanged  Carotids OK  For 2D echo this AM  Diabetes coordinator to see  Will have my office arrange a follow up in 3-4 weeks to schedule surgery

## 2013-05-31 NOTE — Progress Notes (Signed)
Chaplain responded to a code stemi page and reported to cath lab. Pt was brought into Cath lab 3 and family at the waiting area. Chaplain provided ministry of presence, empathic listening, served as Print production planner between [employer] and family members, and provided ministry of presence to family until pt. was admitted at 2927 after procedure. Family thanked chaplain for his presence and support. Kelle Darting [phone]   05/28/13 2255  Clinical Encounter Type  Visited With Patient and family together  Visit Type Initial;Spiritual support;Pre-op;Post-op;Code;Critical Care  Referral From Nurse  Spiritual Encounters  Spiritual Needs Emotional  Stress Factors  Patient Stress Factors Health changes  Family Stress Factors Major life changes

## 2013-05-31 NOTE — Progress Notes (Signed)
Subjective: Occ brief episodes of SOB, CXR stable.  Objective: Vital signs in last 24 hours: Temp:  [97.9 F (36.6 C)-98.7 F (37.1 C)] 98.6 F (37 C) (08/05 0717) Pulse Rate:  [71-89] 71 (08/05 0717) Resp:  [18-20] 20 (08/05 0717) BP: (106-121)/(51-77) 108/60 mmHg (08/05 0717) SpO2:  [94 %-98 %] 98 % (08/05 0717) Weight change:  Last BM Date: 05/30/13 Intake/Output from previous day: +740 08/04 0701 - 08/05 0700 In: 740 [P.O.:740] Out: -  Intake/Output this shift:    PE: General:Pleasant affect, NAD Skin:Warm and dry, brisk capillary refill HEENT:normocephalic, sclera clear, mucus membranes moist Neck:supple, no JVD, no bruits  Heart:S1S2 RRR without murmur, gallup, rub or click Lungs:clear without rales, rhonchi, or wheezes, though somewhat diminished ZOX:WRUE, non tender, + BS, do not palpate liver spleen or masses Ext:no lower ext edema, 2+ pedal pulses, 2+ radial pulses Neuro:alert and oriented, MAE, follows commands, + facial symmetry   Lab Results:  Recent Labs  05/29/13 0400 05/30/13 0450  WBC 14.0* 11.6*  HGB 14.1 13.1  HCT 40.3 37.6*  PLT 251 224   BMET  Recent Labs  05/29/13 0400 05/30/13 0450  NA 136 137  K 4.1 3.8  CL 103 103  CO2 23 24  GLUCOSE 188* 139*  BUN 11 12  CREATININE 0.85 0.98  CALCIUM 9.0 8.9    Recent Labs  05/29/13 0414 05/29/13 1155  TROPONINI 14.80* 9.94*    Lab Results  Component Value Date   CHOL 138 05/30/2013   HDL 34* 05/30/2013   LDLCALC 81 05/30/2013   TRIG 116 05/30/2013   CHOLHDL 4.1 05/30/2013   Lab Results  Component Value Date   HGBA1C 7.1* 05/30/2013     Lab Results  Component Value Date   TSH 1.900 05/28/2013    Hepatic Function Panel  Recent Labs  05/28/13 2300  PROT 7.1  ALBUMIN 3.7  AST 33  ALT 30  ALKPHOS 77  BILITOT 0.4    Recent Labs  05/30/13 0450  CHOL 138   No results found for this basename: PROTIME,  in the last 72 hours     Studies/Results: Dg Chest 2  View  05/30/2013   *RADIOLOGY REPORT*  Clinical Data: Myocardial infarction  CHEST - 2 VIEW  Comparison:  None.  Findings:  The heart size and mediastinal contours are within normal limits.  Both lungs are clear.  The visualized skeletal structures are unremarkable.  IMPRESSION: No active cardiopulmonary disease.   Original Report Authenticated By: Christiana Pellant, M.D.    Medications: I have reviewed the patient's current medications. Scheduled Meds: . aspirin EC  81 mg Oral Daily  . atorvastatin  80 mg Oral q1800  . heparin  5,000 Units Subcutaneous Q8H  . insulin aspart  0-5 Units Subcutaneous QHS  . insulin aspart  0-9 Units Subcutaneous TID WC  . isosorbide mononitrate  30 mg Oral Daily  . metoprolol tartrate  12.5 mg Oral BID  . pantoprazole  40 mg Oral Q0600  . sodium chloride  3 mL Intravenous Q12H  . Ticagrelor  90 mg Oral BID   Continuous Infusions:  PRN Meds:.sodium chloride, acetaminophen, acetaminophen, morphine injection, nitroGLYCERIN, ondansetron (ZOFRAN) IV, ondansetron (ZOFRAN) IV, sodium chloride, traMADol, zolpidem  Assessment/Plan: Principal Problem:   STEMI (ST elevation myocardial infarction)- CFX BMS Active Problems:   CAD (coronary artery disease)- residual CAD, needs CABG, (EF 60%)   LBBB (left bundle branch block)   Hyperglycemia  PLAN: 72 yo presented with  STEMI,  thrombus in the circumflex which was treated with a bare metal stent. He still has preserved LV function. He has residual hemodynamically significant disease in the LAD and the co-dominate RCA. I agree with Dr. Allyson Sabal that these vessels would best be treated with CABG once it is safe to temporarily hold the Brillinta.  Dr. Dorris Fetch evaluated yesterday and plan for CABG.  He will see pt in the office in 3-4 weeks.    He has had paroxysmal LBBB.  Pk troponin 14.8 hgb A1c is 6.8 new diagnosis. Glucose in the 111 to 170 currently on SSI ? Diet alone will ask diabetes coordinator to see.   LOS: 3 days    Time spent with pt. :20 minutes. [facility] R  Nurse Practitioner Certified Pager [phone] 05/31/2013, 7:54 AM   Agree with note written by Nada Boozer RNP  Day # 3 inferolateral STEMI Rx with BMS with residual LAD/RCA disease that will require staged CABG after 4-6 weeks of DAPT. No CP/SOB. Exam benign. Labs OK. Can be D/Cd home today. ROV with me 2-3 weeks.  Runell Gess 05/31/2013 11:54 AM

## 2013-05-31 NOTE — Discharge Summary (Signed)
Physician Discharge Summary      Patient ID: Steve Hickman MRN: [id number] DOB/AGE: 1941-06-19 72 y.o.  Admit date: 05/28/2013 Discharge date: 05/31/2013  Discharge Diagnoses:  Principal Problem:   STEMI (ST elevation myocardial infarction)- CFX BMS Active Problems:   CAD (coronary artery disease)- residual CAD, needs CABG, (EF 60%)   LBBB (left bundle branch block)   Diabetes mellitus   Dyslipidemia, goal LDL below 70   Discharged Condition: good  Procedures: Emergent cardiac cath 05/28/13 by Dr. Allyson Sabal  PTCA and BMS to proximal codominant LCX. By Dr. Allyson Sabal.  Hospital Course: 72yo WM with no PMH, on no meds and has not seen any physician in years who presented to the ER with an inferior STEMI 05/28/13. He started having chest pain after eating dinner and called EMS. He was noted to have at least 4mm of ST elevation in the inferior leads. In the ER he continued to complain of chest pain with persistent elevation in ST segments. He had some transient bradycardia. He was seen and evaluated and taken emergently to the cath lab.  He underwent PCI with BMS to proximal codominant LCX.  He continues with residual disease:   1. Left main; normal  2. LAD; tandem 95 stenoses in the proximal portion. This was a relatively small caliber vessel.  3. Left circumflex; codominant with a 99% thrombotic appearing stenosis in the proximal portion straddling the first moderate-sized obtuse marginal branch.  4. Right coronary artery; codominant with a 95% mid stenosis followed by a long 80% stenosis at the "crux".  5.LIMA TO LAD; subselectively visualized and was widely patent. He was suitable for use during coronary artery bypass grafting if necessary  6. Left ventriculography; RAO left ventriculogram was performed using  25 mL of Visipaque dye at 12 mL/second. The overall LVEF estimated  60 % Without wall motion abnormalities  7, distal abdominal aortography: Renal artery redrawing patent. The infrarenal  abdominal aorta and iliac bifurcation appear free of significant atherosclerotic changes    Plan is to recover from MI and then proceed with CABG in 6 weeks or so.  He has a follow up with Dr. Dorris Fetch.  He has been diagnosed with diabetes.  Diet controlled with HgBA1C of 6.9.  He has been seen by dietician and diabetic coordinator.  He will follow up with a primary care MD as outpt.    He did well post procedure with pk of troponin of 14.80, trending down at discharge.  Unable to begin ACE secondary to hypotension.  He is tolerating low dose BB.    He was seen by Dr. Allyson Sabal day of discharge and found to be stable.  He will follow up as instructed and call if any problems before the appt.   ECHO Done results pending   Consults: vascular surgery Dr. Dorris Fetch  Significant Diagnostic Studies: Pk troponin 14 BMET    Component Value Date/Time   NA 137 05/30/2013 0450   K 3.8 05/30/2013 0450   CL 103 05/30/2013 0450   CO2 24 05/30/2013 0450   GLUCOSE 139* 05/30/2013 0450   BUN 12 05/30/2013 0450   CREATININE 0.98 05/30/2013 0450   CALCIUM 8.9 05/30/2013 0450   GFRNONAA 79* 05/30/2013 0450   GFRAA >90 05/30/2013 0450    CBC    Component Value Date/Time   WBC 11.6* 05/30/2013 0450   RBC 4.09* 05/30/2013 0450   HGB 13.1 05/30/2013 0450   HCT 37.6* 05/30/2013 0450   PLT 224 05/30/2013 0450  MCV 91.9 05/30/2013 0450   MCH 32.0 05/30/2013 0450   MCHC 34.8 05/30/2013 0450   RDW 12.9 05/30/2013 0450   LYMPHSABS 1.7 05/28/2013 2300   MONOABS 0.6 05/28/2013 2300   EOSABS 0.0 05/28/2013 2300   BASOSABS 0.0 05/28/2013 2300    Tchol 138, TG 116, HDL 34, LDL 81.   TSH 1.9  Discharge Exam: Blood pressure 109/66, pulse 72, temperature 97.9 F (36.6 C), temperature source Oral, resp. rate 20, height 5\' 7"  (1.702 m), weight 149 lb 14.6 oz (68 kg), SpO2 95.00%.   AM exam:  PE: General:Pleasant affect, NAD  Skin:Warm and dry, brisk capillary refill  HEENT:normocephalic, sclera clear, mucus membranes moist  Neck:supple, no  JVD, no bruits  Heart:S1S2 RRR without murmur, gallup, rub or click  Lungs:clear without rales, rhonchi, or wheezes, though somewhat diminished  ZOX:WRUE, non tender, + BS, do not palpate liver spleen or masses  Ext:no lower ext edema, 2+ pedal pulses, 2+ radial pulses  Neuro:alert and oriented, MAE, follows commands, + facial symmetry  Disposition: Final discharge disposition not confirmed  Discharge Orders   Future Appointments Provider Department Dept Phone   06/21/2013 11:45 AM Loreli Slot, MD [facility] [place] [phone]   06/22/2013 10:00 AM Runell Gess, MD [facility] [place] [phone]   Future Orders Complete By Expires     Ambulatory referral to Nutrition and Diabetic Education  As directed     Comments:      New onset diabetes.  Has not seen physician in years prior to admission.  To have CABG in approx. 4 weeks.        Medication List         acetaminophen 325 MG tablet  Commonly known as:  TYLENOL  Take 2 tablets (650 mg total) by mouth every 4 (four) hours as needed.     aspirin 81 MG EC tablet  Take 1 tablet (81 mg total) by mouth daily.     atorvastatin 80 MG tablet  Commonly known as:  LIPITOR  Take 1 tablet (80 mg total) by mouth daily at 6 PM.     isosorbide mononitrate 30 MG 24 hr tablet  Commonly known as:  IMDUR  Take 1 tablet (30 mg total) by mouth daily.     metoprolol tartrate 25 MG tablet  Commonly known as:  LOPRESSOR  Take 0.5 tablets (12.5 mg total) by mouth 2 (two) times daily.     nitroGLYCERIN 0.4 MG SL tablet  Commonly known as:  NITROSTAT  Place 1 tablet (0.4 mg total) under the tongue every 5 (five) minutes x 3 doses as needed for chest pain.     pantoprazole 40 MG tablet  Commonly known as:  PROTONIX  Take 1 tablet (40 mg total) by mouth daily at 6 (six) AM.     Ticagrelor 90 MG Tabs tablet  Commonly known as:  BRILINTA  Take 1 tablet (90 mg total)  by mouth 2 (two) times daily.           Follow-up Information   Follow up with Runell Gess, MD On 06/22/2013. (at 10:00 am)    Contact information:   [address] [place] [address] [phone]       Follow up with Loreli Slot, MD. (his office will call with date and time)    Contact information:   [address] [place] [address] [phone]     Discharge Instructions:  No strenuous activity.  No driving for 2 weeks.  Heart Healthy Diabetic diet.   Call [facility] if any bleeding, swelling or drainage at cath site.  May shower, no tub baths for 48 hours for groin sticks.   Take 1 NTG, under your tongue, while sitting.  If no relief of pain may repeat NTG, one tab every 5 minutes up to 3 tablets total over 15 minutes.  If no relief CALL 911.  If you have dizziness/lightheadness  while taking NTG, stop taking and call 911.        Dr. Sunday Corn office will call you for appt.   Do not stop Brilinta.  Check your glucose before meals and at bedtime and keep a log.  You will need a primary care physician to follow your diabetes.   Signed: Leone Brand Nurse Practitioner-Certified [facility] 05/31/2013, 2:00 PM  Time spent on discharge :45 minutes.

## 2013-05-31 NOTE — Progress Notes (Signed)
CARDIAC REHAB PHASE I   PRE:  Rate/Rhythm: 75 SR    BP: sitting 138/65    SaO2:   MODE:  Ambulation: 700 ft   POST:  Rate/Rhythm: 102 ST    BP: sitting 164/85     SaO2:   Tolerated well, denied angina or problems. Gave light ex gl and reminders for NTG. Set up pre-OHS video. Sts he has book. Reminders for IS pre-op.  1020-1040   Elissa Lovett [place] CES, ACSM 05/31/2013 10:46 AM

## 2013-05-31 NOTE — Progress Notes (Signed)
Inpatient Diabetes Program Recommendations  AACE/ADA: New Consensus Statement on Inpatient Glycemic Control (2013)  Target Ranges:  Prepandial:   less than 140 mg/dL      Peak postprandial:   less than 180 mg/dL (1-2 hours)      Critically ill patients:  140 - 180 mg/dL   Reason for Visit: Consult for new onset diabetes diagnosis  Note:  Patient has not seen a physician in many years.  States he was rejected to re-sign for [employer] reserves due to sugar in his urine-- but never followed-up about it or paid attention to it.  Has plans to return for CABG in about 4 weeks.  Encourage patient to seek a PCP soon to follow-up regarding diabetes.  Cardiology service may follow DM for a while, but really needs a PCP.  Glycemic control seems adequate for now with controlled environment.  Needs continued assessment regarding need for possibly medication such as metformin-- or MD may choose to start metformin now since it takes 6 to 8 weeks for full effect to be seen.  (Does not have Part D Medicare, but metformin available as $4 Rx)  Patient agreeable to Outpatient Diabetes Education follow-up with [facility].  Placed order.  Hopefully he can receive most of instruction prior to CABG.  Also gave patient a DVD regarding basic diabetes to view on his computer.  Instructed patient in how to access the Patient Education Network to view DM education videos.   Will need a meter at discharge.  Gave him info regarding a generic meter from [employer] as he will have a $100 deductible from Medicare.  Advised him to check CBG's twice daily- every morning before breakfast and rotate second check of day-- and contact MD prn highs.  Dietitian to see.  Thank you for the consult.  Zanyah Lentsch S. Elsie Lincoln, RN, CNS, CDE Inpatient Diabetes Program, team pager [phone]

## 2013-05-31 NOTE — Plan of Care (Signed)
Problem: Food- and Nutrition-Related Knowledge Deficit (NB-1.1) Goal: Nutrition education Formal process to instruct or train a patient/client in a skill or to impart knowledge to help patients/clients voluntarily manage or modify food choices and eating behavior to maintain or improve health.  Outcome: Completed/Met Date Met:  05/31/13  RD consulted for nutrition education regarding diabetes.     Lab Results  Component Value Date    HGBA1C 7.1* 05/30/2013    RD provided "Carbohydrate Counting for People with Diabetes" handout from the Academy of Nutrition and Dietetics. Discussed different food groups and their effects on blood sugar, emphasizing carbohydrate-containing foods. Provided list of carbohydrates and recommended serving sizes of common foods.  Discussed importance of controlled and consistent carbohydrate intake throughout the day. Provided examples of ways to balance meals/snacks and encouraged intake of high-fiber, whole grain complex carbohydrates. Teach back method used.  Pt states his wife also has diabetes.  He suspects he may have had diabetes for some time with good management at home due to wife's needs.  Discussed continued healthy habits and noted areas of improvement.   Expect good compliance.  Body mass index is 23.47 kg/(m^2). Pt meets criteria for wnl based on current BMI.  Current diet order is CHO Mod Medium, patient is consuming approximately 50% of meals at this time. Pt states he has a good appetite and would likely be eating more at home. Labs and medications reviewed. No further nutrition interventions warranted at this time. RD contact information provided. If additional nutrition issues arise, please re-consult RD.  Loyce Dys, MS RD LDN Clinical Inpatient Dietitian Pager: [phone] Weekend/After hours pager: [phone]

## 2013-06-01 LAB — GLUCOSE, CAPILLARY: Glucose-Capillary: 140 mg/dL — ABNORMAL HIGH (ref 70–99)

## 2013-06-02 NOTE — ED Provider Notes (Signed)
CSN: [id number]     Arrival date & time 05/28/13  2005 History     First MD Initiated Contact with Patient 05/28/13 2015     Chief Complaint  Patient presents with  . Code STEMI   (Consider location/radiation/quality/duration/timing/severity/associated sxs/prior Treatment) HPI  This is a 72yo WM with no PMH, on no meds and has not seen any physician in years who presented to the ER with an inferior STEMI. He started having chest pain after eating dinner and called EMS. He was noted to have at least 4mm of ST elevation in the inferior leads. In the ER he continued to complain of chest pain with persistent elevation in ST segments. He has some transient bradycardia.   Past Medical History  Diagnosis Date  . Medical history non-contributory   . Diabetes mellitus 05/31/2013  . Dyslipidemia, goal LDL below 70 05/31/2013   Past Surgical History  Procedure Laterality Date  . No past surgeries     History reviewed. No pertinent family history. History  Substance Use Topics  . Smoking status: Never Smoker   . Smokeless tobacco: Not on file  . Alcohol Use: 0.6 oz/week    1 Shots of liquor per week    Review of Systems  All systems reviewed and negative, other than as noted in HPI.   Allergies  Review of patient's allergies indicates no known allergies.  Home Medications   Current Outpatient Rx  Name  Route  Sig  Dispense  Refill  . acetaminophen (TYLENOL) 325 MG tablet   Oral   Take 2 tablets (650 mg total) by mouth every 4 (four) hours as needed.         Marland Kitchen aspirin EC 81 MG EC tablet   Oral   Take 1 tablet (81 mg total) by mouth daily.         Marland Kitchen atorvastatin (LIPITOR) 80 MG tablet   Oral   Take 1 tablet (80 mg total) by mouth daily at 6 PM.   30 tablet   6   . isosorbide mononitrate (IMDUR) 30 MG 24 hr tablet   Oral   Take 1 tablet (30 mg total) by mouth daily.   30 tablet   1   . metoprolol tartrate (LOPRESSOR) 25 MG tablet   Oral   Take 0.5 tablets (12.5 mg  total) by mouth 2 (two) times daily.   30 tablet   6   . nitroGLYCERIN (NITROSTAT) 0.4 MG SL tablet   Sublingual   Place 1 tablet (0.4 mg total) under the tongue every 5 (five) minutes x 3 doses as needed for chest pain.   25 tablet   3   . pantoprazole (PROTONIX) 40 MG tablet   Oral   Take 1 tablet (40 mg total) by mouth daily at 6 (six) AM.   30 tablet   2   . Ticagrelor (BRILINTA) 90 MG TABS tablet   Oral   Take 1 tablet (90 mg total) by mouth 2 (two) times daily.   60 tablet   1    BP 109/66  Pulse 72  Temp(Src) 98.1 F (36.7 C) (Oral)  Resp 20  Ht 5\' 7"  (1.702 m)  Wt 149 lb 14.6 oz (68 kg)  BMI 23.47 kg/m2  SpO2 95% Physical Exam  Nursing note and vitals reviewed. Constitutional: No distress.  Laying in bed. Mildly uncomfortable appearing, but not toxic.   HENT:  Head: Normocephalic and atraumatic.  Eyes: Conjunctivae are normal. Right eye exhibits no  discharge. Left eye exhibits no discharge.  Neck: Neck supple.  Cardiovascular: Normal rate, regular rhythm and normal heart sounds.  Exam reveals no gallop and no friction rub.   No murmur heard. Pulmonary/Chest: Effort normal and breath sounds normal. No respiratory distress.  Abdominal: Soft. He exhibits no distension. There is no tenderness.  Musculoskeletal: He exhibits no edema and no tenderness.  Neurological: He is alert.  Skin: Skin is warm and dry.  Psychiatric: He has a normal mood and affect. His behavior is normal. Thought content normal.    ED Course   Procedures (including critical care time)  Labs Reviewed  CBC - Abnormal; Notable for the following:    WBC 13.5 (*)    Hemoglobin 17.2 (*)    All other components within normal limits  DIFFERENTIAL - Abnormal; Notable for the following:    Neutrophils Relative % 39 (*)    Lymphocytes Relative 53 (*)    Lymphs Abs 7.1 (*)    All other components within normal limits  BASIC METABOLIC PANEL - Abnormal; Notable for the following:    Glucose,  Bld 204 (*)    GFR calc non Af Amer 69 (*)    GFR calc Af Amer 81 (*)    All other components within normal limits  TROPONIN I - Abnormal; Notable for the following:    Troponin I 2.56 (*)    All other components within normal limits  TROPONIN I - Abnormal; Notable for the following:    Troponin I 14.80 (*)    All other components within normal limits  TROPONIN I - Abnormal; Notable for the following:    Troponin I 9.94 (*)    All other components within normal limits  PROTIME-INR - Abnormal; Notable for the following:    Prothrombin Time 20.8 (*)    INR 1.85 (*)    All other components within normal limits  APTT - Abnormal; Notable for the following:    aPTT 100 (*)    All other components within normal limits  CBC WITH DIFFERENTIAL - Abnormal; Notable for the following:    WBC 15.8 (*)    Neutrophils Relative % 85 (*)    Neutro Abs 13.5 (*)    Lymphocytes Relative 11 (*)    All other components within normal limits  COMPREHENSIVE METABOLIC PANEL - Abnormal; Notable for the following:    Sodium 132 (*)    Glucose, Bld 223 (*)    GFR calc non Af Amer 85 (*)    All other components within normal limits  HEMOGLOBIN A1C - Abnormal; Notable for the following:    Hemoglobin A1C 6.9 (*)    Mean Plasma Glucose 151 (*)    All other components within normal limits  CBC - Abnormal; Notable for the following:    WBC 14.0 (*)    All other components within normal limits  BASIC METABOLIC PANEL - Abnormal; Notable for the following:    Glucose, Bld 188 (*)    GFR calc non Af Amer 84 (*)    All other components within normal limits  GLUCOSE, CAPILLARY - Abnormal; Notable for the following:    Glucose-Capillary 146 (*)    All other components within normal limits  HEMOGLOBIN A1C - Abnormal; Notable for the following:    Hemoglobin A1C 7.1 (*)    Mean Plasma Glucose 157 (*)    All other components within normal limits  BASIC METABOLIC PANEL - Abnormal; Notable for the following:  Glucose, Bld 139 (*)    GFR calc non Af Amer 79 (*)    All other components within normal limits  CBC - Abnormal; Notable for the following:    WBC 11.6 (*)    RBC 4.09 (*)    HCT 37.6 (*)    All other components within normal limits  GLUCOSE, CAPILLARY - Abnormal; Notable for the following:    Glucose-Capillary 111 (*)    All other components within normal limits  GLUCOSE, CAPILLARY - Abnormal; Notable for the following:    Glucose-Capillary 157 (*)    All other components within normal limits  LIPID PANEL - Abnormal; Notable for the following:    HDL 34 (*)    All other components within normal limits  GLUCOSE, CAPILLARY - Abnormal; Notable for the following:    Glucose-Capillary 131 (*)    All other components within normal limits  GLUCOSE, CAPILLARY - Abnormal; Notable for the following:    Glucose-Capillary 170 (*)    All other components within normal limits  GLUCOSE, CAPILLARY - Abnormal; Notable for the following:    Glucose-Capillary 142 (*)    All other components within normal limits  GLUCOSE, CAPILLARY - Abnormal; Notable for the following:    Glucose-Capillary 111 (*)    All other components within normal limits  GLUCOSE, CAPILLARY - Abnormal; Notable for the following:    Glucose-Capillary 132 (*)    All other components within normal limits  GLUCOSE, CAPILLARY - Abnormal; Notable for the following:    Glucose-Capillary 195 (*)    All other components within normal limits  GLUCOSE, CAPILLARY - Abnormal; Notable for the following:    Glucose-Capillary 140 (*)    All other components within normal limits  POCT I-STAT, CHEM 8 - Abnormal; Notable for the following:    Glucose, Bld 208 (*)    Calcium, Ion 1.09 (*)    Hemoglobin 17.3 (*)    All other components within normal limits  MRSA PCR SCREENING  PROTIME-INR  APTT  TSH  POCT I-STAT TROPONIN I  POCT ACTIVATED CLOTTING TIME   EKG:  Rhythm: normal sinus Vent. rate 70 BPM PR interval 164 ms QRS duration  86 ms QT/QTc 383/414 ms ST segments: ST elevation consistent with acute MI w/reciporical changes   No results found. 1. STEMI (ST elevation myocardial infarction)   2. CAD (coronary artery disease)   3. Coronary atherosclerosis of native coronary artery   4. LBBB (left bundle branch block)   5. STEMI (ST elevation myocardial infarction)- CFX BMS   6. CAD (coronary artery disease)- residual CAD, needs CABG, (EF 60%)   7. Hyperglycemia     MDM  Code STEMI. Likely multi vessel disease with diffuse ST changes. ASA prehospital. Nitro deferred with BP being soft. Heparin. Taken to cath lab.   Raeford Razor, MD 06/02/13 1200

## 2013-06-21 ENCOUNTER — Other Ambulatory Visit: Payer: Self-pay | Admitting: *Deleted

## 2013-06-21 ENCOUNTER — Ambulatory Visit (INDEPENDENT_AMBULATORY_CARE_PROVIDER_SITE_OTHER): Payer: Medicare Other | Admitting: Thoracic Surgery (Cardiothoracic Vascular Surgery)

## 2013-06-21 ENCOUNTER — Encounter: Payer: Self-pay | Admitting: Thoracic Surgery (Cardiothoracic Vascular Surgery)

## 2013-06-21 VITALS — BP 125/82 | HR 64 | Resp 20 | Ht 67.0 in | Wt 150.0 lb

## 2013-06-21 DIAGNOSIS — I2510 Atherosclerotic heart disease of native coronary artery without angina pectoris: Secondary | ICD-10-CM

## 2013-06-21 NOTE — Progress Notes (Signed)
HPI:  Steve Hickman is a 72 year old gentleman who returns for further discussion of coronary bypass grafting.  72 yo male with no known significant PMH and no history of cardiac disease. He was in his usual state of health until Saturday. Shortly after eating dinner he developed a strange sensation in his chest and some discomfort in his arms. He says he felt dizzy and laid his head on the table then had a brief LOC. He was brought to the ED and found to have an inferior STEMI. 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 He had thrombus in the circumflex. Dr. Allyson Sabal performed angioplasty and placed a bare metal stent in the circumflex. He was started on Brilinta with a plan for interval CABG to revascularize the LAD and right coronary.  Since discharge he's been doing well. He's not had any chest pain, pressure or tightness. He's not had any shortness of breath or dizziness. He has an appointment see Dr. Allyson Sabal tomorrow.   Past Medical History  Diagnosis Date  . Medical history non-contributory   . Diabetes mellitus 05/31/2013  . Dyslipidemia, goal LDL below 70 05/31/2013   Past Surgical History  Procedure Laterality Date  . No past surgeries      No family history on file.  History   Social History  . Marital Status: Single    Spouse Name: N/A    Number of Children: N/A  . Years of Education: N/A   Occupational History  . Not on file.   Social History Main Topics  . Smoking status: Never Smoker   . Smokeless tobacco: Not on file  . Alcohol Use: 0.6 oz/week    1 Shots of liquor per week  . Drug Use: Not on file  . Sexual Activity: Not on file   Other Topics Concern  . Not on file   Social History Narrative  . No narrative on file     Current Outpatient Prescriptions  Medication Sig Dispense Refill  . acetaminophen (TYLENOL) 325 MG tablet Take 2 tablets (650 mg total) by mouth every 4 (four) hours as needed.       Marland Kitchen aspirin EC 81 MG EC tablet Take 1 tablet (81 mg total) by mouth daily.      Marland Kitchen atorvastatin (LIPITOR) 80 MG tablet Take 1 tablet (80 mg total) by mouth daily at 6 PM.  30 tablet  6  . isosorbide mononitrate (IMDUR) 30 MG 24 hr tablet Take 1 tablet (30 mg total) by mouth daily.  30 tablet  1  . metoprolol tartrate (LOPRESSOR) 25 MG tablet Take 0.5 tablets (12.5 mg total) by mouth 2 (two) times daily.  30 tablet  6  . nitroGLYCERIN (NITROSTAT) 0.4 MG SL tablet Place 1 tablet (0.4 mg total) under the tongue every 5 (five) minutes x 3 doses as needed for chest pain.  25 tablet  3  . pantoprazole (PROTONIX) 40 MG tablet Take 1 tablet (40 mg total) by mouth daily at 6 (six) AM.  30 tablet  2  . Ticagrelor (BRILINTA) 90 MG TABS tablet Take 1 tablet (90 mg total) by mouth 2 (two) times daily.  60 tablet  1   No current facility-administered medications for this visit.    Physical Exam BP 125/82  Pulse 64  Resp 20  Ht 5\' 7"  (1.702 m)  Wt 150 lb (68.04 kg)  BMI 23.49 kg/m2  SpO11 56% 72 year old  gentleman in no acute distress Well-developed well-nourished No carotid bruits Cardiac regular rate and rhythm normal S1 and S2 no murmur Lungs clear with equal breath sounds bilaterally No peripheral edema   Diagnostic Tests: CARDIAC CATHETERIZATION  HEMODYNAMICS:  AO SYSTOLIC/AO DIASTOLIC: 169/96  LV SYSTOLIC/LV DIASTOLIC: 166/24  ANGIOGRAPHIC RESULTS:  1. Left main; normal  2. LAD; tandem 95 stenoses in the proximal portion. This was a relatively small caliber vessel.  3. Left circumflex; codominant with a 99% thrombotic appearing stenosis in the proximal portion straddling the first moderate-sized obtuse marginal branch.  4. Right coronary artery; codominant with a 95% mid stenosis followed by a long 80% stenosis at the "crux".  5.LIMA TO LAD; subselectively visualized and was widely patent. He was suitable for use during coronary artery bypass grafting if necessary  6. Left ventriculography;  RAO left ventriculogram was performed using  25 mL of Visipaque dye at 12 mL/second. The overall LVEF estimated  60 % Without wall motion abnormalities  7, distal abdominal aortography: Renal artery redrawing patent. 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 He did have TIMI 3 flow and was hemodynamically stable. He has high grade proximal segmental LAD disease as well as codominant RCA disease. I do not think his LAD extendable. I'm going to proceed with PCI and stenting of his proximal circumflex with bare-metal stent, aspirin, and Brilenta. Will perform aspiration thrombectomy first.   Impression: 72 year old male who presented in early August with a ST elevation MI. He had emergency angioplasty for a thrombotic lesion in the left circumflex. He has residual significant two-vessel disease involving the LAD and right coronary which is not amenable to percutaneous intervention. He needs coronary bypass grafting for revascularization.  I have discussed with the patient and his wife the general nature of the procedure, the need for general anesthesia, and the incisions to be used. We discussed the expected hospital stay, overall recovery and short and long term outcomes. They understand the risks include, but are not limited to, death, stroke, MI, DVT/PE, bleeding, possible need for transfusion, infections, cardiac arrhythmias, and other organ system dysfunction including respiratory, renal, or GI complications. He accepts these risks and agrees to proceed.  He will need to be off Brilinta for 5 days prior to surgery. We would like to wait until it has been a month from the time of his stent before stopping Brilinta. We have set a date for surgery for Thursday, 07/07/2013. He will stop Brilinta on Sunday, 07/03/2013.   Plan:  CABG x 2 on Thursday,  07/07/2013

## 2013-06-22 ENCOUNTER — Ambulatory Visit (INDEPENDENT_AMBULATORY_CARE_PROVIDER_SITE_OTHER): Payer: Medicare Other | Admitting: Cardiovascular Disease

## 2013-06-22 ENCOUNTER — Encounter: Payer: Self-pay | Admitting: Cardiovascular Disease

## 2013-06-22 ENCOUNTER — Ambulatory Visit: Payer: Medicare Other | Admitting: Cardiovascular Disease

## 2013-06-22 VITALS — BP 126/68 | HR 56 | Ht 67.0 in | Wt 149.5 lb

## 2013-06-22 DIAGNOSIS — I219 Acute myocardial infarction, unspecified: Secondary | ICD-10-CM | POA: Diagnosis not present

## 2013-06-22 DIAGNOSIS — I2510 Atherosclerotic heart disease of native coronary artery without angina pectoris: Secondary | ICD-10-CM

## 2013-06-22 DIAGNOSIS — Z01818 Encounter for other preprocedural examination: Secondary | ICD-10-CM

## 2013-06-22 DIAGNOSIS — I213 ST elevation (STEMI) myocardial infarction of unspecified site: Secondary | ICD-10-CM

## 2013-06-22 NOTE — Assessment & Plan Note (Signed)
Patient had a STEMI on 82 the Cath Lab revealing three-vessel disease with a normal EF. Culprit vessel was a circumflex which was subtotally occluded with thrombotic-appearing stenosis and underwent aspiration thrombectomy and stenting with a bare-metal stent. He did have high-grade disease in his LAD and RCA. He was discharged home with intent to bring him back electively for coronary artery bypass grafting. He had no recurrent chest pain. He is on dual antiplatelet therapy including Brilenta which he'll need to be on for a total of a one-month and discontinued 7 days prior to his anticipated revascularization procedure on 07/07/13 by Dr. Andrey Spearman.

## 2013-06-22 NOTE — Progress Notes (Signed)
06/22/2013 Edd [name]   06/13/1941  [id number]  Primary Physician No primary provider on file. Primary Cardiologist: Runell Gess MD Roseanne Reno   HPI:  Mr. Heidelberger is a 72 year old married Caucasian male father of one child was accompanied by his wife today. He has  no prior cardiac history. He has no primary care physician nor is he on any medications. He developed substernal chest pain and hour prior to presentation to the hospital where he was found to have inferolateral ST segment elevation. A code STEMI was called and the patient was brought urgently to the cath lab for angiography and intervention. This revealed three-vessel disease with normal LV function. His culprit vessel was the circumflex which underwent aspiration thrombectomy and stenting using a bare-metal stent. He is on dual antibiotic therapy including aspirin and brought to. Plan for him was to undergo staged elective coronary artery bypass grafting by Dr. Jamesetta Orleans after a month of dual antiplatelet therapy    Current Outpatient Prescriptions  Medication Sig Dispense Refill  . acetaminophen (TYLENOL) 325 MG tablet Take 2 tablets (650 mg total) by mouth every 4 (four) hours as needed.      Marland Kitchen aspirin EC 81 MG EC tablet Take 1 tablet (81 mg total) by mouth daily.      Marland Kitchen atorvastatin (LIPITOR) 80 MG tablet Take 1 tablet (80 mg total) by mouth daily at 6 PM.  30 tablet  6  . isosorbide mononitrate (IMDUR) 30 MG 24 hr tablet Take 1 tablet (30 mg total) by mouth daily.  30 tablet  1  . metoprolol tartrate (LOPRESSOR) 25 MG tablet Take 0.5 tablets (12.5 mg total) by mouth 2 (two) times daily.  30 tablet  6  . nitroGLYCERIN (NITROSTAT) 0.4 MG SL tablet Place 1 tablet (0.4 mg total) under the tongue every 5 (five) minutes x 3 doses as needed for chest pain.  25 tablet  3  . pantoprazole (PROTONIX) 40 MG tablet Take 1 tablet (40 mg total) by mouth daily at 6 (six) AM.  30 tablet  2  . Ticagrelor (BRILINTA) 90  MG TABS tablet Take 1 tablet (90 mg total) by mouth 2 (two) times daily.  60 tablet  1   No current facility-administered medications for this visit.    No Known Allergies  History   Social History  . Marital Status: Single    Spouse Name: N/A    Number of Children: N/A  . Years of Education: N/A   Occupational History  . Not on file.   Social History Main Topics  . Smoking status: Never Smoker   . Smokeless tobacco: Never Used  . Alcohol Use: 0.6 oz/week    1 Shots of liquor per week  . Drug Use: Not on file  . Sexual Activity: Not on file   Other Topics Concern  . Not on file   Social History Narrative  . No narrative on file     Review of Systems: General: negative for chills, fever, night sweats or weight changes.  Cardiovascular: negative for chest pain, dyspnea on exertion, edema, orthopnea, palpitations, paroxysmal nocturnal dyspnea or shortness of breath Dermatological: negative for rash Respiratory: negative for cough or wheezing Urologic: negative for hematuria Abdominal: negative for nausea, vomiting, diarrhea, bright red blood per rectum, melena, or hematemesis Neurologic: negative for visual changes, syncope, or dizziness All other systems reviewed and are otherwise negative except as noted above.    Blood pressure 126/68, pulse 56, height 5\' 7"  (  1.702 m), weight 149 lb 8 oz (67.813 kg).  General appearance: alert and no distress Neck: no adenopathy, no carotid bruit, no JVD, supple, symmetrical, trachea midline and thyroid not enlarged, symmetric, no tenderness/mass/nodules Lungs: clear to auscultation bilaterally Heart: regular rate and rhythm, S1, S2 normal, no murmur, click, rub or gallop Extremities: extremities normal, atraumatic, no cyanosis or edema  EKG sinus bradycardia at 56 without ST or T wave changes  ASSESSMENT AND PLAN:   STEMI (ST elevation myocardial infarction)- CFX BMS Patient had a STEMI on 82 the Cath Lab revealing  three-vessel disease with a normal EF. Culprit vessel was a circumflex which was subtotally occluded with thrombotic-appearing stenosis and underwent aspiration thrombectomy and stenting with a bare-metal stent. He did have high-grade disease in his LAD and RCA. He was discharged home with intent to bring him back electively for coronary artery bypass grafting. He had no recurrent chest pain. He is on dual antiplatelet therapy including Brilenta which he'll need to be on for a total of a one-month and discontinued 7 days prior to his anticipated revascularization procedure on 07/07/13 by Dr. Andrey Spearman.      Runell Gess MD FACP,FACC,FAHA, [facility] 06/22/2013 11:05 AM

## 2013-06-22 NOTE — Patient Instructions (Signed)
  We will see you back in follow up in October after your open heart surgery.  Dr Allyson Sabal has ordered a carotid doppler to be done prior to Sept 11, 2014

## 2013-06-29 ENCOUNTER — Ambulatory Visit (HOSPITAL_COMMUNITY)
Admission: RE | Admit: 2013-06-29 | Discharge: 2013-06-29 | Disposition: A | Discharge status: Home / Self Care | Payer: Medicare Other | Source: Ambulatory Visit | Attending: Cardiovascular Disease | Admitting: Cardiovascular Disease

## 2013-06-29 DIAGNOSIS — Z01818 Encounter for other preprocedural examination: Secondary | ICD-10-CM | POA: Insufficient documentation

## 2013-06-29 DIAGNOSIS — I2510 Atherosclerotic heart disease of native coronary artery without angina pectoris: Secondary | ICD-10-CM

## 2013-06-29 DIAGNOSIS — Z01810 Encounter for preprocedural cardiovascular examination: Secondary | ICD-10-CM

## 2013-06-29 NOTE — Progress Notes (Signed)
Carotid Duplex Completed. °Brianna L Mazza,RVT °

## 2013-07-04 ENCOUNTER — Encounter: Payer: Self-pay | Admitting: *Deleted

## 2013-07-04 ENCOUNTER — Encounter (HOSPITAL_COMMUNITY): Payer: Self-pay | Admitting: Pharmacist

## 2013-07-05 ENCOUNTER — Encounter (HOSPITAL_COMMUNITY): Payer: Self-pay

## 2013-07-05 ENCOUNTER — Encounter (HOSPITAL_COMMUNITY)
Admission: RE | Admit: 2013-07-05 | Discharge: 2013-07-05 | Disposition: A | Discharge status: Home / Self Care | Payer: Medicare Other | Source: Ambulatory Visit | Attending: Thoracic Surgery (Cardiothoracic Vascular Surgery) | Admitting: Thoracic Surgery (Cardiothoracic Vascular Surgery)

## 2013-07-05 ENCOUNTER — Ambulatory Visit (HOSPITAL_COMMUNITY)
Admission: RE | Admit: 2013-07-05 | Discharge: 2013-07-05 | Disposition: A | Discharge status: Home / Self Care | Payer: Medicare Other | Source: Ambulatory Visit | Attending: Thoracic Surgery (Cardiothoracic Vascular Surgery) | Admitting: Thoracic Surgery (Cardiothoracic Vascular Surgery)

## 2013-07-05 VITALS — BP 126/79 | HR 57 | Temp 97.3°F | Resp 18 | Ht 67.0 in | Wt 149.4 lb

## 2013-07-05 DIAGNOSIS — I2119 ST elevation (STEMI) myocardial infarction involving other coronary artery of inferior wall: Secondary | ICD-10-CM | POA: Diagnosis present

## 2013-07-05 DIAGNOSIS — J9819 Other pulmonary collapse: Secondary | ICD-10-CM | POA: Diagnosis not present

## 2013-07-05 DIAGNOSIS — E785 Hyperlipidemia, unspecified: Secondary | ICD-10-CM | POA: Diagnosis present

## 2013-07-05 DIAGNOSIS — D62 Acute posthemorrhagic anemia: Secondary | ICD-10-CM | POA: Diagnosis not present

## 2013-07-05 DIAGNOSIS — I959 Hypotension, unspecified: Secondary | ICD-10-CM | POA: Diagnosis not present

## 2013-07-05 DIAGNOSIS — Z9861 Coronary angioplasty status: Secondary | ICD-10-CM

## 2013-07-05 DIAGNOSIS — I517 Cardiomegaly: Secondary | ICD-10-CM | POA: Diagnosis not present

## 2013-07-05 DIAGNOSIS — R0789 Other chest pain: Secondary | ICD-10-CM | POA: Diagnosis not present

## 2013-07-05 DIAGNOSIS — R011 Cardiac murmur, unspecified: Secondary | ICD-10-CM | POA: Diagnosis not present

## 2013-07-05 DIAGNOSIS — I2510 Atherosclerotic heart disease of native coronary artery without angina pectoris: Secondary | ICD-10-CM | POA: Diagnosis not present

## 2013-07-05 DIAGNOSIS — R0609 Other forms of dyspnea: Secondary | ICD-10-CM | POA: Diagnosis not present

## 2013-07-05 DIAGNOSIS — I498 Other specified cardiac arrhythmias: Secondary | ICD-10-CM | POA: Insufficient documentation

## 2013-07-05 DIAGNOSIS — E119 Type 2 diabetes mellitus without complications: Secondary | ICD-10-CM | POA: Diagnosis not present

## 2013-07-05 DIAGNOSIS — E8779 Other fluid overload: Secondary | ICD-10-CM | POA: Diagnosis not present

## 2013-07-05 DIAGNOSIS — J90 Pleural effusion, not elsewhere classified: Secondary | ICD-10-CM | POA: Diagnosis not present

## 2013-07-05 DIAGNOSIS — Z01810 Encounter for preprocedural cardiovascular examination: Secondary | ICD-10-CM | POA: Insufficient documentation

## 2013-07-05 DIAGNOSIS — I447 Left bundle-branch block, unspecified: Secondary | ICD-10-CM | POA: Insufficient documentation

## 2013-07-05 DIAGNOSIS — I4891 Unspecified atrial fibrillation: Secondary | ICD-10-CM | POA: Diagnosis not present

## 2013-07-05 DIAGNOSIS — Z01812 Encounter for preprocedural laboratory examination: Secondary | ICD-10-CM | POA: Insufficient documentation

## 2013-07-05 DIAGNOSIS — I219 Acute myocardial infarction, unspecified: Secondary | ICD-10-CM | POA: Diagnosis not present

## 2013-07-05 DIAGNOSIS — R0602 Shortness of breath: Secondary | ICD-10-CM | POA: Diagnosis not present

## 2013-07-05 DIAGNOSIS — Z01818 Encounter for other preprocedural examination: Secondary | ICD-10-CM | POA: Diagnosis not present

## 2013-07-05 LAB — ABO/RH: ABO/RH(D): O POS

## 2013-07-05 LAB — CBC
HCT: 40.6 % (ref 39.0–52.0)
MCH: 32.1 pg (ref 26.0–34.0)
MCHC: 35.5 g/dL (ref 30.0–36.0)
MCV: 90.4 fL (ref 78.0–100.0)
Platelets: 242 10*3/uL (ref 150–400)
RDW: 13 % (ref 11.5–15.5)

## 2013-07-05 LAB — COMPREHENSIVE METABOLIC PANEL
AST: 31 U/L (ref 0–37)
Albumin: 4 g/dL (ref 3.5–5.2)
BUN: 9 mg/dL (ref 6–23)
Calcium: 9.4 mg/dL (ref 8.4–10.5)
Chloride: 104 mEq/L (ref 96–112)
Creatinine, Ser: 0.8 mg/dL (ref 0.50–1.35)
Total Bilirubin: 0.7 mg/dL (ref 0.3–1.2)

## 2013-07-05 LAB — HEMOGLOBIN A1C
Hgb A1c MFr Bld: 7.1 % — ABNORMAL HIGH (ref <5.7)
Mean Plasma Glucose: 157 mg/dL — ABNORMAL HIGH (ref <117)

## 2013-07-05 LAB — BLOOD GAS, ARTERIAL
Acid-base deficit: 1.4 mmol/L (ref 0.0–2.0)
O2 Saturation: 98.3 %
Patient temperature: 98.6
TCO2: 23.1 mmol/L (ref 0–100)
pCO2 arterial: 32.7 mmHg — ABNORMAL LOW (ref 35.0–45.0)

## 2013-07-05 LAB — URINALYSIS, ROUTINE W REFLEX MICROSCOPIC
Glucose, UA: 250 mg/dL — AB
Leukocytes, UA: NEGATIVE
pH: 5.5 (ref 5.0–8.0)

## 2013-07-05 LAB — URINE MICROSCOPIC-ADD ON

## 2013-07-05 LAB — PROTIME-INR: Prothrombin Time: 13.2 seconds (ref 11.6–15.2)

## 2013-07-05 LAB — APTT: aPTT: 29 seconds (ref 24–37)

## 2013-07-05 MED ORDER — CHLORHEXIDINE GLUCONATE 4 % EX LIQD: 30.0000 mL | CUTANEOUS | Status: DC

## 2013-07-05 NOTE — Pre-Procedure Instructions (Signed)
Claud Gowan  07/05/2013   Your procedure is scheduled on:  Thursday July 07, 2013  Report to [facility] at 0530 AM.  Call this number if you have problems the morning of surgery: [phone]   Remember:   Do not eat food or drink liquids after midnight.   Take these medicines the morning of surgery with A SIP OF WATER: Isosorbide Mononitrate( Imdur), Metoprolol, Protonix  And NTG if needed.  Brilinta to be stopped 07/03/2013 for surgery. Stop all NSAIDS and herbal medications.   Do not wear jewelry.  Do not wear lotions,or powders. You may wear deodorant.  Do not shave 48 hours prior to surgery. Men may shave face and neck.  Do not bring valuables to the hospital.  [facility] is not responsible  for any belongings or valuables.  Contacts, dentures or bridgework may not be worn into surgery.  Leave suitcase in the car. After surgery it may be brought to your room.  For patients admitted to the hospital, checkout time is 11:00 AM the day of discharge.   Patients discharged the day of surgery will not be allowed to drive home.    Special Instructions: Incentive Spirometry - Practice and bring it with you on the day of surgery. Shower using CHG 2 nights before surgery and the night before surgery.  If you shower the day of surgery use CHG.  Use special wash - you have one bottle of CHG for all showers.  You should use approximately 1/3 of the bottle for each shower.   Please read over the following fact sheets that you were given: Pain Booklet, Coughing and Deep Breathing, Blood Transfusion Information, MRSA Information and Surgical Site Infection Prevention

## 2013-07-06 MED ORDER — DOPAMINE-DEXTROSE 3.2-5 MG/ML-% IV SOLN
2.0000 ug/kg/min | INTRAVENOUS | Status: DC
  Filled 2013-07-06: qty 250

## 2013-07-06 MED ORDER — SODIUM CHLORIDE 0.9 % IV SOLN
INTRAVENOUS | Status: AC
  Administered 2013-07-07: 140 mL/h via INTRAVENOUS
  Filled 2013-07-06: qty 40

## 2013-07-06 MED ORDER — DEXTROSE 5 % IV SOLN
1.5000 g | INTRAVENOUS | Status: AC
  Administered 2013-07-07: 1.5 g via INTRAVENOUS
  Administered 2013-07-07: .75 g via INTRAVENOUS
  Filled 2013-07-06 (×2): qty 1.5

## 2013-07-06 MED ORDER — VANCOMYCIN HCL 10 G IV SOLR
1250.0000 mg | INTRAVENOUS | Status: AC
  Administered 2013-07-07: 1250 mg via INTRAVENOUS
  Filled 2013-07-06 (×2): qty 1250

## 2013-07-06 MED ORDER — POTASSIUM CHLORIDE 2 MEQ/ML IV SOLN
80.0000 meq | INTRAVENOUS | Status: DC
  Filled 2013-07-06: qty 40

## 2013-07-06 MED ORDER — DEXTROSE 5 % IV SOLN
750.0000 mg | INTRAVENOUS | Status: DC
  Filled 2013-07-06: qty 750

## 2013-07-06 MED ORDER — EPINEPHRINE HCL 1 MG/ML IJ SOLN
0.5000 ug/min | INTRAVENOUS | Status: DC
  Filled 2013-07-06: qty 4

## 2013-07-06 MED ORDER — DEXMEDETOMIDINE HCL IN NACL 400 MCG/100ML IV SOLN
0.1000 ug/kg/h | INTRAVENOUS | Status: AC
  Administered 2013-07-07: 0.2 ug/kg/h via INTRAVENOUS
  Filled 2013-07-06: qty 100

## 2013-07-06 MED ORDER — SODIUM CHLORIDE 0.9 % IV SOLN
INTRAVENOUS | Status: DC
  Filled 2013-07-06: qty 30

## 2013-07-06 MED ORDER — PLASMA-LYTE 148 IV SOLN
INTRAVENOUS | Status: AC
  Administered 2013-07-07: 08:00:00
  Filled 2013-07-06: qty 2.5

## 2013-07-06 MED ORDER — MAGNESIUM SULFATE 50 % IJ SOLN
40.0000 meq | INTRAMUSCULAR | Status: DC
  Filled 2013-07-06: qty 10

## 2013-07-06 MED ORDER — SODIUM CHLORIDE 0.9 % IV SOLN
INTRAVENOUS | Status: AC
  Administered 2013-07-07: 1 [IU]/h via INTRAVENOUS
  Filled 2013-07-06: qty 1

## 2013-07-06 MED ORDER — PHENYLEPHRINE HCL 10 MG/ML IJ SOLN
30.0000 ug/min | INTRAVENOUS | Status: AC
  Administered 2013-07-07: 25 ug/min via INTRAVENOUS
  Filled 2013-07-06: qty 2

## 2013-07-06 MED ORDER — NITROGLYCERIN IN D5W 200-5 MCG/ML-% IV SOLN
2.0000 ug/min | INTRAVENOUS | Status: AC
  Administered 2013-07-07: 3 ug/min via INTRAVENOUS
  Filled 2013-07-06: qty 250

## 2013-07-07 ENCOUNTER — Encounter (HOSPITAL_COMMUNITY): Payer: Self-pay | Admitting: *Deleted

## 2013-07-07 ENCOUNTER — Ambulatory Visit: Payer: Medicare Other | Admitting: Cardiovascular Disease

## 2013-07-07 ENCOUNTER — Inpatient Hospital Stay (HOSPITAL_COMMUNITY): Payer: Medicare Other

## 2013-07-07 ENCOUNTER — Encounter (HOSPITAL_COMMUNITY): Payer: Self-pay | Admitting: Anesthesiology

## 2013-07-07 ENCOUNTER — Inpatient Hospital Stay (HOSPITAL_COMMUNITY): Payer: Medicare Other | Admitting: Anesthesiology

## 2013-07-07 ENCOUNTER — Encounter (HOSPITAL_COMMUNITY)
Admission: RE | Disposition: A | Discharge status: Home / Self Care | Payer: Medicare Other | Source: Ambulatory Visit | Attending: Thoracic Surgery (Cardiothoracic Vascular Surgery)

## 2013-07-07 ENCOUNTER — Inpatient Hospital Stay (HOSPITAL_COMMUNITY)
Admission: RE | Admit: 2013-07-07 | Discharge: 2013-07-13 | DRG: 236 | Disposition: A | Discharge status: Home / Self Care | Payer: Medicare Other | Source: Ambulatory Visit | Attending: Thoracic Surgery (Cardiothoracic Vascular Surgery) | Admitting: Thoracic Surgery (Cardiothoracic Vascular Surgery)

## 2013-07-07 DIAGNOSIS — E119 Type 2 diabetes mellitus without complications: Secondary | ICD-10-CM

## 2013-07-07 DIAGNOSIS — I252 Old myocardial infarction: Secondary | ICD-10-CM | POA: Diagnosis present

## 2013-07-07 DIAGNOSIS — E785 Hyperlipidemia, unspecified: Secondary | ICD-10-CM | POA: Diagnosis present

## 2013-07-07 DIAGNOSIS — I447 Left bundle-branch block, unspecified: Secondary | ICD-10-CM

## 2013-07-07 DIAGNOSIS — I2510 Atherosclerotic heart disease of native coronary artery without angina pectoris: Principal | ICD-10-CM

## 2013-07-07 DIAGNOSIS — I213 ST elevation (STEMI) myocardial infarction of unspecified site: Secondary | ICD-10-CM

## 2013-07-07 DIAGNOSIS — R0600 Dyspnea, unspecified: Secondary | ICD-10-CM

## 2013-07-07 DIAGNOSIS — Z951 Presence of aortocoronary bypass graft: Secondary | ICD-10-CM | POA: Diagnosis present

## 2013-07-07 DIAGNOSIS — I480 Paroxysmal atrial fibrillation: Secondary | ICD-10-CM

## 2013-07-07 DIAGNOSIS — R011 Cardiac murmur, unspecified: Secondary | ICD-10-CM

## 2013-07-07 HISTORY — PX: CORONARY ARTERY BYPASS GRAFT: SHX141

## 2013-07-07 HISTORY — DX: Atherosclerotic heart disease of native coronary artery without angina pectoris: I25.10

## 2013-07-07 HISTORY — DX: ST elevation (STEMI) myocardial infarction of unspecified site: I21.3

## 2013-07-07 LAB — POCT I-STAT 3, ART BLOOD GAS (G3+)
Acid-base deficit: 4 mmol/L — ABNORMAL HIGH (ref 0.0–2.0)
Bicarbonate: 22.7 mEq/L (ref 20.0–24.0)
O2 Saturation: 100 %
O2 Saturation: 99 %
O2 Saturation: 96 %
O2 Saturation: 97 %
Patient temperature: 35.8
Patient temperature: 36.4
Patient temperature: 36.7
TCO2: 25 mmol/L (ref 0–100)
TCO2: 24 mmol/L (ref 0–100)
pCO2 arterial: 35.5 mmHg (ref 35.0–45.0)
pCO2 arterial: 36.9 mmHg (ref 35.0–45.0)
pH, Arterial: 7.419 (ref 7.350–7.450)
pH, Arterial: 7.336 — ABNORMAL LOW (ref 7.350–7.450)
pO2, Arterial: 319 mmHg — ABNORMAL HIGH (ref 80.0–100.0)

## 2013-07-07 LAB — CBC
HCT: 27.3 % — ABNORMAL LOW (ref 39.0–52.0)
Hemoglobin: 9.3 g/dL — ABNORMAL LOW (ref 13.0–17.0)
MCHC: 35.1 g/dL (ref 30.0–36.0)
MCV: 91 fL (ref 78.0–100.0)
RBC: 2.91 MIL/uL — ABNORMAL LOW (ref 4.22–5.81)
RBC: 3 MIL/uL — ABNORMAL LOW (ref 4.22–5.81)
RDW: 13 % (ref 11.5–15.5)
WBC: 14.5 10*3/uL — ABNORMAL HIGH (ref 4.0–10.5)
WBC: 15.3 10*3/uL — ABNORMAL HIGH (ref 4.0–10.5)

## 2013-07-07 LAB — POCT I-STAT 4, (NA,K, GLUC, HGB,HCT)
Glucose, Bld: 135 mg/dL — ABNORMAL HIGH (ref 70–99)
Glucose, Bld: 139 mg/dL — ABNORMAL HIGH (ref 70–99)
Glucose, Bld: 142 mg/dL — ABNORMAL HIGH (ref 70–99)
HCT: 33 % — ABNORMAL LOW (ref 39.0–52.0)
HCT: 23 % — ABNORMAL LOW (ref 39.0–52.0)
HCT: 26 % — ABNORMAL LOW (ref 39.0–52.0)
Hemoglobin: 10.9 g/dL — ABNORMAL LOW (ref 13.0–17.0)
Hemoglobin: 7.8 g/dL — ABNORMAL LOW (ref 13.0–17.0)
Potassium: 3.7 mEq/L (ref 3.5–5.1)
Potassium: 3.6 mEq/L (ref 3.5–5.1)
Potassium: 3.9 mEq/L (ref 3.5–5.1)
Potassium: 3.4 mEq/L — ABNORMAL LOW (ref 3.5–5.1)
Sodium: 135 mEq/L (ref 135–145)
Sodium: 141 mEq/L (ref 135–145)

## 2013-07-07 LAB — HEMOGLOBIN AND HEMATOCRIT, BLOOD: HCT: 24.2 % — ABNORMAL LOW (ref 39.0–52.0)

## 2013-07-07 LAB — PREPARE RBC (CROSSMATCH)

## 2013-07-07 LAB — POCT I-STAT, CHEM 8
Calcium, Ion: 1.14 mmol/L (ref 1.13–1.30)
Chloride: 106 mEq/L (ref 96–112)
HCT: 27 % — ABNORMAL LOW (ref 39.0–52.0)
Potassium: 4.5 mEq/L (ref 3.5–5.1)

## 2013-07-07 LAB — PROTIME-INR
INR: 1.4 (ref 0.00–1.49)
Prothrombin Time: 16.8 seconds — ABNORMAL HIGH (ref 11.6–15.2)

## 2013-07-07 LAB — GLUCOSE, CAPILLARY
Glucose-Capillary: 96 mg/dL (ref 70–99)
Glucose-Capillary: 117 mg/dL — ABNORMAL HIGH (ref 70–99)
Glucose-Capillary: 155 mg/dL — ABNORMAL HIGH (ref 70–99)
Glucose-Capillary: 128 mg/dL — ABNORMAL HIGH (ref 70–99)

## 2013-07-07 LAB — CREATININE, SERUM
GFR calc Af Amer: >90 mL/min (ref >90)
GFR calc non Af Amer: >90 mL/min (ref >90)

## 2013-07-07 LAB — APTT: aPTT: 37 seconds (ref 24–37)

## 2013-07-07 SURGERY — CORONARY ARTERY BYPASS GRAFTING (CABG)
Anesthesia: General | Site: Chest | Wound class: Clean

## 2013-07-07 MED ORDER — ASPIRIN EC 325 MG PO TBEC
325.0000 mg | DELAYED_RELEASE_TABLET | Freq: Every day | ORAL | Status: DC
  Administered 2013-07-08: 325 mg via ORAL
  Filled 2013-07-07: qty 1

## 2013-07-07 MED ORDER — MORPHINE SULFATE 2 MG/ML IJ SOLN
1.0000 mg | INTRAMUSCULAR | Status: AC | PRN
  Administered 2013-07-07: 2 mg via INTRAVENOUS
  Filled 2013-07-07: qty 1

## 2013-07-07 MED ORDER — POTASSIUM CHLORIDE 10 MEQ/50ML IV SOLN
10.0000 meq | INTRAVENOUS | Status: AC
  Administered 2013-07-07 (×3): 10 meq via INTRAVENOUS

## 2013-07-07 MED ORDER — FENTANYL CITRATE 0.05 MG/ML IJ SOLN
INTRAMUSCULAR | Status: DC | PRN
  Administered 2013-07-07 (×2): 250 ug via INTRAVENOUS
  Administered 2013-07-07: 100 ug via INTRAVENOUS
  Administered 2013-07-07 (×2): 250 ug via INTRAVENOUS

## 2013-07-07 MED ORDER — POTASSIUM CHLORIDE 10 MEQ/50ML IV SOLN
10.0000 meq | Freq: Once | INTRAVENOUS | Status: AC
  Administered 2013-07-07: 10 meq via INTRAVENOUS

## 2013-07-07 MED ORDER — MIDAZOLAM HCL 5 MG/5ML IJ SOLN
INTRAMUSCULAR | Status: DC | PRN
  Administered 2013-07-07: 1 mg via INTRAVENOUS
  Administered 2013-07-07 (×2): 2 mg via INTRAVENOUS
  Administered 2013-07-07: 1 mg via INTRAVENOUS

## 2013-07-07 MED ORDER — ACETAMINOPHEN 160 MG/5ML PO SOLN: 650.0000 mg | Freq: Once | ORAL | Status: AC

## 2013-07-07 MED ORDER — PHENYLEPHRINE HCL 10 MG/ML IJ SOLN
0.0000 ug/min | INTRAVENOUS | Status: DC
  Administered 2013-07-07: 5 ug/min via INTRAVENOUS
  Filled 2013-07-07 (×3): qty 2

## 2013-07-07 MED ORDER — MAGNESIUM SULFATE 40 MG/ML IJ SOLN
4.0000 g | Freq: Once | INTRAMUSCULAR | Status: AC
  Administered 2013-07-07: 4 g via INTRAVENOUS
  Filled 2013-07-07: qty 100

## 2013-07-07 MED ORDER — SODIUM CHLORIDE 0.9 % IV SOLN
INTRAVENOUS | Status: DC
  Administered 2013-07-07: 2 [IU]/h via INTRAVENOUS
  Filled 2013-07-07 (×2): qty 1

## 2013-07-07 MED ORDER — MIDAZOLAM HCL 2 MG/2ML IJ SOLN: 2.0000 mg | INTRAMUSCULAR | Status: DC | PRN

## 2013-07-07 MED ORDER — METOPROLOL TARTRATE 12.5 MG HALF TABLET
12.5000 mg | ORAL_TABLET | Freq: Two times a day (BID) | ORAL | Status: DC
  Filled 2013-07-07 (×5): qty 1

## 2013-07-07 MED ORDER — LACTATED RINGERS IV SOLN
INTRAVENOUS | Status: DC | PRN
  Administered 2013-07-07 (×2): via INTRAVENOUS

## 2013-07-07 MED ORDER — ACETAMINOPHEN 650 MG RE SUPP
650.0000 mg | Freq: Once | RECTAL | Status: AC
  Administered 2013-07-07: 650 mg via RECTAL

## 2013-07-07 MED ORDER — PANTOPRAZOLE SODIUM 40 MG PO TBEC: 40.0000 mg | DELAYED_RELEASE_TABLET | Freq: Every day | ORAL | Status: DC

## 2013-07-07 MED ORDER — SODIUM CHLORIDE 0.9 % IJ SOLN
OROMUCOSAL | Status: DC | PRN
  Administered 2013-07-07 (×3): via TOPICAL

## 2013-07-07 MED ORDER — FENTANYL CITRATE 0.05 MG/ML IJ SOLN
INTRAMUSCULAR | Status: AC
  Filled 2013-07-07: qty 2

## 2013-07-07 MED ORDER — PROPOFOL 10 MG/ML IV BOLUS
INTRAVENOUS | Status: DC | PRN
  Administered 2013-07-07: 70 mg via INTRAVENOUS

## 2013-07-07 MED ORDER — ASPIRIN 81 MG PO CHEW: 324.0000 mg | CHEWABLE_TABLET | Freq: Every day | ORAL | Status: DC

## 2013-07-07 MED ORDER — LACTATED RINGERS IV SOLN
INTRAVENOUS | Status: DC | PRN
  Administered 2013-07-07 (×3): via INTRAVENOUS

## 2013-07-07 MED ORDER — HEPARIN SODIUM (PORCINE) 1000 UNIT/ML IJ SOLN
INTRAMUSCULAR | Status: DC | PRN
  Administered 2013-07-07: 24000 [IU] via INTRAVENOUS
  Administered 2013-07-07: 2000 [IU] via INTRAVENOUS

## 2013-07-07 MED ORDER — ONDANSETRON HCL 4 MG/2ML IJ SOLN
4.0000 mg | Freq: Four times a day (QID) | INTRAMUSCULAR | Status: DC | PRN
  Administered 2013-07-08: 4 mg via INTRAVENOUS
  Filled 2013-07-07: qty 2

## 2013-07-07 MED ORDER — MIDAZOLAM HCL 2 MG/2ML IJ SOLN
INTRAMUSCULAR | Status: AC
  Filled 2013-07-07: qty 2

## 2013-07-07 MED ORDER — SODIUM CHLORIDE 0.9 % IJ SOLN
3.0000 mL | Freq: Two times a day (BID) | INTRAMUSCULAR | Status: DC
  Administered 2013-07-08 (×2): 3 mL via INTRAVENOUS

## 2013-07-07 MED ORDER — DEXMEDETOMIDINE HCL IN NACL 200 MCG/50ML IV SOLN: 0.1000 ug/kg/h | INTRAVENOUS | Status: DC

## 2013-07-07 MED ORDER — ACETAMINOPHEN 160 MG/5ML PO SOLN: 1000.0000 mg | Freq: Four times a day (QID) | ORAL | Status: DC

## 2013-07-07 MED ORDER — VECURONIUM BROMIDE 10 MG IV SOLR
INTRAVENOUS | Status: DC | PRN
  Administered 2013-07-07: 3 mg via INTRAVENOUS
  Administered 2013-07-07: 4 mg via INTRAVENOUS

## 2013-07-07 MED ORDER — BISACODYL 10 MG RE SUPP: 10.0000 mg | Freq: Every day | RECTAL | Status: DC

## 2013-07-07 MED ORDER — DEXTROSE 5 % IV SOLN
1.5000 g | Freq: Two times a day (BID) | INTRAVENOUS | Status: AC
  Administered 2013-07-07 – 2013-07-09 (×4): 1.5 g via INTRAVENOUS
  Filled 2013-07-07 (×4): qty 1.5

## 2013-07-07 MED ORDER — PROTAMINE SULFATE 10 MG/ML IV SOLN
INTRAVENOUS | Status: DC | PRN
  Administered 2013-07-07: 200 mg via INTRAVENOUS

## 2013-07-07 MED ORDER — ACETAMINOPHEN 500 MG PO TABS
1000.0000 mg | ORAL_TABLET | Freq: Four times a day (QID) | ORAL | Status: DC
  Administered 2013-07-07 – 2013-07-09 (×6): 1000 mg via ORAL
  Filled 2013-07-07 (×10): qty 2

## 2013-07-07 MED ORDER — FAMOTIDINE IN NACL 20-0.9 MG/50ML-% IV SOLN
20.0000 mg | Freq: Two times a day (BID) | INTRAVENOUS | Status: AC
  Administered 2013-07-07: 20 mg via INTRAVENOUS

## 2013-07-07 MED ORDER — ATORVASTATIN CALCIUM 80 MG PO TABS
80.0000 mg | ORAL_TABLET | Freq: Every day | ORAL | Status: DC
  Administered 2013-07-08 – 2013-07-12 (×5): 80 mg via ORAL
  Filled 2013-07-07 (×6): qty 1

## 2013-07-07 MED ORDER — METOPROLOL TARTRATE 1 MG/ML IV SOLN: 2.5000 mg | INTRAVENOUS | Status: DC | PRN

## 2013-07-07 MED ORDER — ALBUMIN HUMAN 5 % IV SOLN
250.0000 mL | INTRAVENOUS | Status: AC | PRN
  Administered 2013-07-07 – 2013-07-08 (×3): 250 mL via INTRAVENOUS
  Filled 2013-07-07: qty 250

## 2013-07-07 MED ORDER — SODIUM CHLORIDE 0.9 % IV SOLN
INTRAVENOUS | Status: DC
  Administered 2013-07-07: 14:00:00 via INTRAVENOUS

## 2013-07-07 MED ORDER — 0.9 % SODIUM CHLORIDE (POUR BTL) OPTIME
TOPICAL | Status: DC | PRN
  Administered 2013-07-07: 6000 mL

## 2013-07-07 MED ORDER — LACTATED RINGERS IV SOLN: 500.0000 mL | Freq: Once | INTRAVENOUS | Status: AC | PRN

## 2013-07-07 MED ORDER — METOPROLOL TARTRATE 25 MG/10 ML ORAL SUSPENSION
12.5000 mg | Freq: Two times a day (BID) | ORAL | Status: DC
  Filled 2013-07-07 (×5): qty 5

## 2013-07-07 MED ORDER — METOPROLOL TARTRATE 12.5 MG HALF TABLET: 12.5000 mg | ORAL_TABLET | Freq: Once | ORAL | Status: DC

## 2013-07-07 MED ORDER — MORPHINE SULFATE 2 MG/ML IJ SOLN
2.0000 mg | INTRAMUSCULAR | Status: DC | PRN
  Administered 2013-07-07 – 2013-07-08 (×2): 2 mg via INTRAVENOUS
  Filled 2013-07-07 (×3): qty 1

## 2013-07-07 MED ORDER — OXYCODONE HCL 5 MG PO TABS
5.0000 mg | ORAL_TABLET | ORAL | Status: DC | PRN
  Administered 2013-07-07: 5 mg via ORAL
  Administered 2013-07-08: 10 mg via ORAL
  Filled 2013-07-07 (×2): qty 2
  Filled 2013-07-07: qty 1

## 2013-07-07 MED ORDER — DOCUSATE SODIUM 100 MG PO CAPS
200.0000 mg | ORAL_CAPSULE | Freq: Every day | ORAL | Status: DC
  Administered 2013-07-08: 200 mg via ORAL
  Filled 2013-07-07: qty 2

## 2013-07-07 MED ORDER — HEMOSTATIC AGENTS (NO CHARGE) OPTIME
TOPICAL | Status: DC | PRN
  Administered 2013-07-07: 1 via TOPICAL

## 2013-07-07 MED ORDER — INSULIN REGULAR BOLUS VIA INFUSION
0.0000 [IU] | Freq: Three times a day (TID) | INTRAVENOUS | Status: DC
  Administered 2013-07-09: 2 [IU] via INTRAVENOUS
  Filled 2013-07-07: qty 10

## 2013-07-07 MED ORDER — SODIUM CHLORIDE 0.9 % IJ SOLN: 3.0000 mL | INTRAMUSCULAR | Status: DC | PRN

## 2013-07-07 MED ORDER — SODIUM CHLORIDE 0.45 % IV SOLN
INTRAVENOUS | Status: DC
  Administered 2013-07-07 – 2013-07-09 (×2): via INTRAVENOUS

## 2013-07-07 MED ORDER — BISACODYL 5 MG PO TBEC
10.0000 mg | DELAYED_RELEASE_TABLET | Freq: Every day | ORAL | Status: DC
  Administered 2013-07-08 – 2013-07-13 (×4): 10 mg via ORAL
  Filled 2013-07-07 (×4): qty 2

## 2013-07-07 MED ORDER — NITROGLYCERIN IN D5W 200-5 MCG/ML-% IV SOLN: 0.0000 ug/min | INTRAVENOUS | Status: DC

## 2013-07-07 MED ORDER — SODIUM CHLORIDE 0.9 % IV SOLN: 250.0000 mL | INTRAVENOUS | Status: DC

## 2013-07-07 MED ORDER — VANCOMYCIN HCL IN DEXTROSE 1-5 GM/200ML-% IV SOLN
1000.0000 mg | Freq: Once | INTRAVENOUS | Status: AC
  Administered 2013-07-07: 1000 mg via INTRAVENOUS
  Filled 2013-07-07: qty 200

## 2013-07-07 MED ORDER — ROCURONIUM BROMIDE 100 MG/10ML IV SOLN
INTRAVENOUS | Status: DC | PRN
  Administered 2013-07-07: 50 mg via INTRAVENOUS

## 2013-07-07 MED ORDER — ALBUMIN HUMAN 5 % IV SOLN
INTRAVENOUS | Status: DC | PRN
  Administered 2013-07-07 (×2): via INTRAVENOUS

## 2013-07-07 MED ORDER — LACTATED RINGERS IV SOLN
INTRAVENOUS | Status: DC
  Administered 2013-07-07 (×2): via INTRAVENOUS

## 2013-07-07 SURGICAL SUPPLY — 90 items
ATTRACTOMAT 16X20 MAGNETIC DRP (DRAPES) ×2 IMPLANT
BAG DECANTER FOR FLEXI CONT (MISCELLANEOUS) ×2 IMPLANT
BANDAGE ELASTIC 4 VELCRO ST LF (GAUZE/BANDAGES/DRESSINGS) ×2 IMPLANT
BANDAGE ELASTIC 6 VELCRO ST LF (GAUZE/BANDAGES/DRESSINGS) ×2 IMPLANT
BANDAGE GAUZE ELAST BULKY 4 IN (GAUZE/BANDAGES/DRESSINGS) ×2 IMPLANT
BASKET HEART (ORDER IN 25'S) (MISCELLANEOUS) ×1
BASKET HEART (ORDER IN 25S) (MISCELLANEOUS) ×1 IMPLANT
BLADE STERNUM SYSTEM 6 (BLADE) ×2 IMPLANT
CANISTER SUCTION 2500CC (MISCELLANEOUS) ×2 IMPLANT
CANNULA EZ GLIDE AORTIC 21FR (CANNULA) ×2 IMPLANT
CANNULA VENOUS LOW PROF 34X46 (CANNULA) IMPLANT
CATH CPB KIT HENDRICKSON (MISCELLANEOUS) ×2 IMPLANT
CATH ROBINSON RED A/P 18FR (CATHETERS) ×2 IMPLANT
CATH THORACIC 36FR (CATHETERS) ×2 IMPLANT
CATH THORACIC 36FR RT ANG (CATHETERS) ×2 IMPLANT
CLIP TI MEDIUM 24 (CLIP) IMPLANT
CLIP TI WIDE RED SMALL 24 (CLIP) ×4 IMPLANT
CLOTH BEACON ORANGE TIMEOUT ST (SAFETY) ×2 IMPLANT
COVER SURGICAL LIGHT HANDLE (MISCELLANEOUS) ×2 IMPLANT
CRADLE DONUT ADULT HEAD (MISCELLANEOUS) ×2 IMPLANT
DRAPE CARDIOVASCULAR INCISE (DRAPES) ×1
DRAPE SLUSH/WARMER DISC (DRAPES) ×2 IMPLANT
DRAPE SRG 135X102X78XABS (DRAPES) ×1 IMPLANT
DRSG AQUACEL AG ADV 3.5X14 (GAUZE/BANDAGES/DRESSINGS) ×2 IMPLANT
DRSG COVADERM 4X14 (GAUZE/BANDAGES/DRESSINGS) IMPLANT
ELECT REM PT RETURN 9FT ADLT (ELECTROSURGICAL) ×4
ELECTRODE REM PT RTRN 9FT ADLT (ELECTROSURGICAL) ×2 IMPLANT
GLOVE BIO SURGEON STRL SZ 6 (GLOVE) ×4 IMPLANT
GLOVE BIO SURGEON STRL SZ 6.5 (GLOVE) ×6 IMPLANT
GLOVE BIO SURGEON STRL SZ7.5 (GLOVE) ×10 IMPLANT
GLOVE BIO SURGEON STRL SZ8 (GLOVE) ×2 IMPLANT
GLOVE BIOGEL PI IND STRL 7.0 (GLOVE) ×1 IMPLANT
GLOVE BIOGEL PI INDICATOR 7.0 (GLOVE) ×1
GLOVE EUDERMIC 7 POWDERFREE (GLOVE) ×6 IMPLANT
GOWN PREVENTION PLUS XLARGE (GOWN DISPOSABLE) ×8 IMPLANT
GOWN STRL NON-REIN LRG LVL3 (GOWN DISPOSABLE) ×8 IMPLANT
HEMOSTAT POWDER SURGIFOAM 1G (HEMOSTASIS) ×6 IMPLANT
HEMOSTAT SURGICEL 2X14 (HEMOSTASIS) ×2 IMPLANT
INSERT FOGARTY XLG (MISCELLANEOUS) IMPLANT
KIT BASIN OR (CUSTOM PROCEDURE TRAY) ×2 IMPLANT
KIT ROOM TURNOVER OR (KITS) ×2 IMPLANT
KIT SUCTION CATH 14FR (SUCTIONS) ×4 IMPLANT
KIT VASOVIEW W/TROCAR VH 2000 (KITS) ×2 IMPLANT
MARKER GRAFT CORONARY BYPASS (MISCELLANEOUS) ×6 IMPLANT
NS IRRIG 1000ML POUR BTL (IV SOLUTION) ×10 IMPLANT
PACK OPEN HEART (CUSTOM PROCEDURE TRAY) ×2 IMPLANT
PAD ARMBOARD 7.5X6 YLW CONV (MISCELLANEOUS) ×4 IMPLANT
PAD ELECT DEFIB RADIOL ZOLL (MISCELLANEOUS) ×2 IMPLANT
PENCIL BUTTON HOLSTER BLD 10FT (ELECTRODE) ×2 IMPLANT
PUNCH AORTIC ROTATE 4.0MM (MISCELLANEOUS) IMPLANT
PUNCH AORTIC ROTATE 4.5MM 8IN (MISCELLANEOUS) ×2 IMPLANT
PUNCH AORTIC ROTATE 5MM 8IN (MISCELLANEOUS) IMPLANT
SET CARDIOPLEGIA MPS 5001102 (MISCELLANEOUS) ×2 IMPLANT
SPONGE GAUZE 4X4 12PLY (GAUZE/BANDAGES/DRESSINGS) ×8 IMPLANT
SUT BONE WAX W31G (SUTURE) ×2 IMPLANT
SUT ETHIBOND NAB MH 2-0 36IN (SUTURE) ×2 IMPLANT
SUT MNCRL AB 4-0 PS2 18 (SUTURE) IMPLANT
SUT PROLENE 3 0 SH DA (SUTURE) ×2 IMPLANT
SUT PROLENE 4 0 RB 1 (SUTURE)
SUT PROLENE 4 0 SH DA (SUTURE) ×2 IMPLANT
SUT PROLENE 4-0 RB1 .5 CRCL 36 (SUTURE) IMPLANT
SUT PROLENE 6 0 C 1 30 (SUTURE) ×4 IMPLANT
SUT PROLENE 7 0 BV 1 (SUTURE) ×2 IMPLANT
SUT PROLENE 7 0 BV1 MDA (SUTURE) ×2 IMPLANT
SUT PROLENE 8 0 BV175 6 (SUTURE) ×2 IMPLANT
SUT SILK  1 MH (SUTURE)
SUT SILK 1 MH (SUTURE) IMPLANT
SUT STEEL 6MS V (SUTURE) ×2 IMPLANT
SUT STEEL STERNAL CCS#1 18IN (SUTURE) IMPLANT
SUT STEEL SZ 6 DBL 3X14 BALL (SUTURE) ×2 IMPLANT
SUT VIC AB 1 CTX 36 (SUTURE) ×3
SUT VIC AB 1 CTX36XBRD ANBCTR (SUTURE) ×3 IMPLANT
SUT VIC AB 2-0 CT1 27 (SUTURE) ×1
SUT VIC AB 2-0 CT1 TAPERPNT 27 (SUTURE) ×1 IMPLANT
SUT VIC AB 2-0 CTX 27 (SUTURE) IMPLANT
SUT VIC AB 3-0 SH 27 (SUTURE)
SUT VIC AB 3-0 SH 27X BRD (SUTURE) IMPLANT
SUT VIC AB 3-0 X1 27 (SUTURE) ×2 IMPLANT
SUT VICRYL 4-0 PS2 18IN ABS (SUTURE) IMPLANT
SUTURE E-PAK OPEN HEART (SUTURE) ×2 IMPLANT
SYSTEM SAHARA CHEST DRAIN ATS (WOUND CARE) ×2 IMPLANT
TAPE CLOTH SURG 4X10 WHT LF (GAUZE/BANDAGES/DRESSINGS) ×6 IMPLANT
TOWEL OR 17X24 6PK STRL BLUE (TOWEL DISPOSABLE) ×4 IMPLANT
TOWEL OR 17X26 10 PK STRL BLUE (TOWEL DISPOSABLE) ×4 IMPLANT
TRAY FOLEY IC TEMP SENS 14FR (CATHETERS) ×2 IMPLANT
TUBE FEEDING 8FR 16IN STR KANG (MISCELLANEOUS) ×2 IMPLANT
TUBE SUCT INTRACARD DLP 20F (MISCELLANEOUS) ×2 IMPLANT
TUBING INSUFFLATION 10FT LAP (TUBING) ×2 IMPLANT
UNDERPAD 30X30 INCONTINENT (UNDERPADS AND DIAPERS) ×2 IMPLANT
WATER STERILE IRR 1000ML POUR (IV SOLUTION) ×4 IMPLANT

## 2013-07-07 NOTE — Anesthesia Procedure Notes (Signed)
Procedure Name: Intubation Date/Time: 07/07/2013 8:15 AM Performed by: Whitman Hero Pre-anesthesia Checklist: Patient identified, Emergency Drugs available, Suction available, Patient being monitored and Timeout performed Patient Re-evaluated:Patient Re-evaluated prior to inductionOxygen Delivery Method: Circle system utilized Preoxygenation: Pre-oxygenation with 100% oxygen Intubation Type: IV induction Ventilation: Mask ventilation without difficulty Laryngoscope Size: Mac and 3 Grade View: Grade I Tube type: Oral Tube size: 8.0 mm Number of attempts: 1 Airway Equipment and Method: Stylet Placement Confirmation: ETT inserted through vocal cords under direct vision,  positive ETCO2 and breath sounds checked- equal and bilateral Secured at: 22 cm Tube secured with: Tape Dental Injury: Teeth and Oropharynx as per pre-operative assessment

## 2013-07-07 NOTE — Preoperative (Signed)
Beta Blockers   Reason not to administer Beta Blockers:Not Applicable 

## 2013-07-07 NOTE — Brief Op Note (Addendum)
      [address]       [place] [address]             [phone]     07/07/2013  11:23 AM  PATIENT:  Steve Hickman  72 y.o. male  PRE-OPERATIVE DIAGNOSIS:  CAD  POST-OPERATIVE DIAGNOSIS:  CAD  PROCEDURE:  Procedure(s): CORONARY ARTERY BYPASS GRAFTING (CABG)X2 LIMA-LAD; SVG-PD EVH LEFT LEG  SURGEON:  Surgeon(s): Loreli Slot, MD  PHYSICIAN ASSISTANT: WAYNE GOLD PA-C  ANESTHESIA:   general  PATIENT CONDITION:  ICU - intubated and hemodynamically stable.  PRE-OPERATIVE WEIGHT: 67kg  COMPLICATIONS: NO KNOWN  XC= 50 min CPB= 77 min  Good conduits, Both LAD and PDA small diffusely diseased vessels

## 2013-07-07 NOTE — Anesthesia Postprocedure Evaluation (Signed)
  Anesthesia Post-op Note  Patient: Steve Hickman  Procedure(s) Performed: Procedure(s) with comments: CORONARY ARTERY BYPASS GRAFTING (CABG) (N/A) - CABG X 2  Patient Location: PACU  Anesthesia Type:General  Level of Consciousness: sedated and Patient remains intubated per anesthesia plan  Airway and Oxygen Therapy: Patient remains intubated per anesthesia plan  Post-op Pain: none  Post-op Assessment: Post-op Vital signs reviewed  Post-op Vital Signs: stable  Complications: No apparent anesthesia complications

## 2013-07-07 NOTE — Procedures (Signed)
Extubation Procedure Note  Patient Details:   Name: Steve Hickman DOB: [birth year] MRN: [id number]   Airway Documentation:    NIF -36, VC 1.0 L, + air leak around cuff noted.    Evaluation  O2 sats: stable throughout Complications: No apparent complications Patient did tolerate procedure well. Bilateral Breath Sounds: Clear Suctioning: Airway Yes, pt able to speak.  No stridor noted, [facility] clear.  No distress noted.  Pt denies SOB   Jennette Kettle 07/07/2013, 5:33 PM

## 2013-07-07 NOTE — Interval H&P Note (Signed)
History and Physical Interval Note:  07/07/2013 7:56 AM  Steve Hickman  has presented today for surgery, with the diagnosis of CAD  The various methods of treatment have been discussed with the patient and family. After consideration of risks, benefits and other options for treatment, the patient has consented to  Procedure(s) with comments: CORONARY ARTERY BYPASS GRAFTING (CABG) (N/A) - CABG X 2 as a surgical intervention .  The patient's history has been reviewed, patient examined, no change in status, stable for surgery.  I have reviewed the patient's chart and labs.  Questions were answered to the patient's satisfaction.     HENDRICKSON,STEVEN C

## 2013-07-07 NOTE — Progress Notes (Signed)
[facility] BRIEF SICU PROGRESS NOTE  Day of Surgery  S/P Procedure(s) (LRB): CORONARY ARTERY BYPASS GRAFTING (CABG) (N/A)   Extubated uneventfully AAI paced rhythm w/ stable hemodynamics Chest tube output low UOP adequate Labs okay  Plan: Continue routine early postop  Steve Hickman,Steve Hickman 07/07/2013 5:33 PM

## 2013-07-07 NOTE — Transfer of Care (Signed)
Immediate Anesthesia Transfer of Care Note  Patient: Steve Hickman  Procedure(s) Performed: Procedure(s) with comments: CORONARY ARTERY BYPASS GRAFTING (CABG) (N/A) - CABG X 2  Patient Location: SICU  Anesthesia Type:General  Level of Consciousness: sedated, unresponsive and Patient remains intubated per anesthesia plan  Airway & Oxygen Therapy: Patient remains intubated per anesthesia plan and Patient placed on Ventilator (see vital sign flow sheet for setting)  Post-op Assessment: Report given to PACU RN and Post -op Vital signs reviewed and stable  Post vital signs: Reviewed and stable  Complications: No apparent anesthesia complications

## 2013-07-07 NOTE — H&P (View-Only) (Signed)
HPI:  [name] is a 72-year-old gentleman who returns for further discussion of coronary bypass grafting.  72 yo male with no known significant PMH and no history of cardiac disease. He was in his usual state of health until Saturday. Shortly after eating dinner he developed a strange sensation in his chest and some discomfort in his arms. He says he felt dizzy and laid his head on the table then had a brief LOC. He was brought to the ED and found to have an inferior STEMI. 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 He had thrombus in the circumflex. Dr. Berry performed angioplasty and placed a bare metal stent in the circumflex. He was started on Brilinta with a plan for interval CABG to revascularize the LAD and right coronary.  Since discharge he's been doing well. He's not had any chest pain, pressure or tightness. He's not had any shortness of breath or dizziness. He has an appointment see Dr. Berry tomorrow.   Past Medical History  Diagnosis Date  . Medical history non-contributory   . Diabetes mellitus 05/31/2013  . Dyslipidemia, goal LDL below 70 05/31/2013   Past Surgical History  Procedure Laterality Date  . No past surgeries      No family history on file.  History   Social History  . Marital Status: Single    Spouse Name: N/A    Number of Children: N/A  . Years of Education: N/A   Occupational History  . Not on file.   Social History Main Topics  . Smoking status: Never Smoker   . Smokeless tobacco: Not on file  . Alcohol Use: 0.6 oz/week    1 Shots of liquor per week  . Drug Use: Not on file  . Sexual Activity: Not on file   Other Topics Concern  . Not on file   Social History Narrative  . No narrative on file     Current Outpatient Prescriptions  Medication Sig Dispense Refill  . acetaminophen (TYLENOL) 325 MG tablet Take 2 tablets (650 mg total) by mouth every 4 (four) hours as needed.       . aspirin EC 81 MG EC tablet Take 1 tablet (81 mg total) by mouth daily.      . atorvastatin (LIPITOR) 80 MG tablet Take 1 tablet (80 mg total) by mouth daily at 6 PM.  30 tablet  6  . isosorbide mononitrate (IMDUR) 30 MG 24 hr tablet Take 1 tablet (30 mg total) by mouth daily.  30 tablet  1  . metoprolol tartrate (LOPRESSOR) 25 MG tablet Take 0.5 tablets (12.5 mg total) by mouth 2 (two) times daily.  30 tablet  6  . nitroGLYCERIN (NITROSTAT) 0.4 MG SL tablet Place 1 tablet (0.4 mg total) under the tongue every 5 (five) minutes x 3 doses as needed for chest pain.  25 tablet  3  . pantoprazole (PROTONIX) 40 MG tablet Take 1 tablet (40 mg total) by mouth daily at 6 (six) AM.  30 tablet  2  . Ticagrelor (BRILINTA) 90 MG TABS tablet Take 1 tablet (90 mg total) by mouth 2 (two) times daily.  60 tablet  1   No current facility-administered medications for this visit.    Physical Exam BP 125/82  Pulse 64  Resp 20  Ht 5' 7" (1.702 m)  Wt 150 lb (68.04 kg)  BMI 23.49 kg/m2  SpO2 97% 72-year-old   gentleman in no acute distress Well-developed well-nourished No carotid bruits Cardiac regular rate and rhythm normal S1 and S2 no murmur Lungs clear with equal breath sounds bilaterally No peripheral edema   Diagnostic Tests: CARDIAC CATHETERIZATION  HEMODYNAMICS:  AO SYSTOLIC/AO DIASTOLIC: 169/96  LV SYSTOLIC/LV DIASTOLIC: 166/24  ANGIOGRAPHIC RESULTS:  1. Left main; normal  2. LAD; tandem 95 stenoses in the proximal portion. This was a relatively small caliber vessel.  3. Left circumflex; codominant with a 99% thrombotic appearing stenosis in the proximal portion straddling the first moderate-sized obtuse marginal branch.  4. Right coronary artery; codominant with a 95% mid stenosis followed by a long 80% stenosis at the "crux".  5.LIMA TO LAD; subselectively visualized and was widely patent. He was suitable for use during coronary artery bypass grafting if necessary  6. Left ventriculography;  RAO left ventriculogram was performed using  25 mL of Visipaque dye at 12 mL/second. The overall LVEF estimated  60 % Without wall motion abnormalities  7, distal abdominal aortography: Renal artery redrawing patent. The infrarenal abdominal aorta and iliac bifurcation appear free of significant atherosclerotic changes  IMPRESSION:[name] has a high-grade subtotal thrombotic proximal codominant circumflex. He did have TIMI 3 flow and was hemodynamically stable. He has high grade proximal segmental LAD disease as well as codominant RCA disease. I do not think his LAD extendable. I'm going to proceed with PCI and stenting of his proximal circumflex with bare-metal stent, aspirin, and Brilenta. Will perform aspiration thrombectomy first.   Impression: 72-year-old male who presented in early August with a ST elevation MI. He had emergency angioplasty for a thrombotic lesion in the left circumflex. He has residual significant two-vessel disease involving the LAD and right coronary which is not amenable to percutaneous intervention. He needs coronary bypass grafting for revascularization.  I have discussed with the patient and his wife the general nature of the procedure, the need for general anesthesia, and the incisions to be used. We discussed the expected hospital stay, overall recovery and short and long term outcomes. They understand the risks include, but are not limited to, death, stroke, MI, DVT/PE, bleeding, possible need for transfusion, infections, cardiac arrhythmias, and other organ system dysfunction including respiratory, renal, or GI complications. He accepts these risks and agrees to proceed.  He will need to be off Brilinta for 5 days prior to surgery. We would like to wait until it has been a month from the time of his stent before stopping Brilinta. We have set a date for surgery for Thursday, 07/07/2013. He will stop Brilinta on Sunday, 07/03/2013.   Plan:  CABG x 2 on Thursday,  07/07/2013  

## 2013-07-07 NOTE — Anesthesia Preprocedure Evaluation (Signed)
Anesthesia Evaluation  Patient identified by MRN, date of birth, ID band Patient awake    Airway   Neck ROM: Full    Dental   Pulmonary shortness of breath,  breath sounds clear to auscultation        Cardiovascular + CAD and + Past MI + dysrhythmias Rhythm:Regular Rate:Normal     Neuro/Psych    GI/Hepatic   Endo/Other  diabetes  Renal/GU      Musculoskeletal   Abdominal   Peds  Hematology   Anesthesia Other Findings   Reproductive/Obstetrics                           Anesthesia Physical Anesthesia Plan  ASA: III  Anesthesia Plan: General   Post-op Pain Management:    Induction: Intravenous  Airway Management Planned: Oral ETT  Additional Equipment: Arterial line and PA Cath  Intra-op Plan:   Post-operative Plan: Extubation in OR  Informed Consent:   Dental advisory given  Plan Discussed with: CRNA and Surgeon  Anesthesia Plan Comments:         Anesthesia Quick Evaluation

## 2013-07-07 NOTE — Progress Notes (Signed)
The patient underwent CABG this AM. Dr. Allyson Sabal has instructed to restart him on Brilinta. I spoke with Dr. Dorris Fetch, CT surgeon, in regards to this. He wishes to restart him on Brilinta in the AM.  Will continue to follow.   Allayne Butcher, PA-C [facility]

## 2013-07-07 NOTE — Progress Notes (Signed)
Started SICU wean protocol.  

## 2013-07-07 NOTE — Progress Notes (Signed)
Weaned to ps/cpap per SICU protocol.

## 2013-07-08 ENCOUNTER — Inpatient Hospital Stay (HOSPITAL_COMMUNITY): Payer: Medicare Other

## 2013-07-08 ENCOUNTER — Encounter (HOSPITAL_COMMUNITY): Payer: Self-pay | Admitting: Thoracic Surgery (Cardiothoracic Vascular Surgery)

## 2013-07-08 DIAGNOSIS — R011 Cardiac murmur, unspecified: Secondary | ICD-10-CM

## 2013-07-08 DIAGNOSIS — I2510 Atherosclerotic heart disease of native coronary artery without angina pectoris: Principal | ICD-10-CM

## 2013-07-08 DIAGNOSIS — I219 Acute myocardial infarction, unspecified: Secondary | ICD-10-CM

## 2013-07-08 DIAGNOSIS — I447 Left bundle-branch block, unspecified: Secondary | ICD-10-CM

## 2013-07-08 LAB — GLUCOSE, CAPILLARY
Glucose-Capillary: 108 mg/dL — ABNORMAL HIGH (ref 70–99)
Glucose-Capillary: 116 mg/dL — ABNORMAL HIGH (ref 70–99)
Glucose-Capillary: 99 mg/dL (ref 70–99)
Glucose-Capillary: 112 mg/dL — ABNORMAL HIGH (ref 70–99)

## 2013-07-08 LAB — POCT I-STAT, CHEM 8
BUN: 8 mg/dL (ref 6–23)
Calcium, Ion: 1.16 mmol/L (ref 1.13–1.30)
Chloride: 99 mEq/L (ref 96–112)
Glucose, Bld: 102 mg/dL — ABNORMAL HIGH (ref 70–99)
TCO2: 24 mmol/L (ref 0–100)

## 2013-07-08 LAB — POCT I-STAT 4, (NA,K, GLUC, HGB,HCT)
Glucose, Bld: 111 mg/dL — ABNORMAL HIGH (ref 70–99)
HCT: 25 % — ABNORMAL LOW (ref 39.0–52.0)
Potassium: 4.3 mEq/L (ref 3.5–5.1)

## 2013-07-08 LAB — CBC
HCT: 26.8 % — ABNORMAL LOW (ref 39.0–52.0)
MCH: 31.6 pg (ref 26.0–34.0)
MCV: 91 fL (ref 78.0–100.0)
MCV: 91.5 fL (ref 78.0–100.0)
Platelets: 140 10*3/uL — ABNORMAL LOW (ref 150–400)
RDW: 12.9 % (ref 11.5–15.5)
RDW: 13.3 % (ref 11.5–15.5)
WBC: 13.3 10*3/uL — ABNORMAL HIGH (ref 4.0–10.5)
WBC: 12.1 10*3/uL — ABNORMAL HIGH (ref 4.0–10.5)

## 2013-07-08 LAB — BASIC METABOLIC PANEL
Calcium: 7.7 mg/dL — ABNORMAL LOW (ref 8.4–10.5)
Creatinine, Ser: 0.7 mg/dL (ref 0.50–1.35)
GFR calc Af Amer: >90 mL/min (ref >90)

## 2013-07-08 LAB — POCT I-STAT 3, VENOUS BLOOD GAS (G3P V)
Bicarbonate: 24.4 mEq/L — ABNORMAL HIGH (ref 20.0–24.0)
O2 Saturation: 68 %
TCO2: 26 mmol/L (ref 0–100)
pCO2, Ven: 40 mmHg — ABNORMAL LOW (ref 45.0–50.0)
pH, Ven: 7.395 — ABNORMAL HIGH (ref 7.250–7.300)

## 2013-07-08 LAB — MAGNESIUM: Magnesium: 2.3 mg/dL (ref 1.5–2.5)

## 2013-07-08 LAB — CREATININE, SERUM: GFR calc Af Amer: >90 mL/min (ref >90)

## 2013-07-08 MED ORDER — ALBUMIN HUMAN 5 % IV SOLN
12.5000 g | Freq: Once | INTRAVENOUS | Status: AC
  Administered 2013-07-08: 12.5 g via INTRAVENOUS
  Filled 2013-07-08: qty 250

## 2013-07-08 MED ORDER — ENOXAPARIN SODIUM 40 MG/0.4ML ~~LOC~~ SOLN
40.0000 mg | Freq: Every day | SUBCUTANEOUS | Status: DC
  Administered 2013-07-08: 40 mg via SUBCUTANEOUS
  Filled 2013-07-08 (×2): qty 0.4

## 2013-07-08 MED ORDER — INSULIN DETEMIR 100 UNIT/ML ~~LOC~~ SOLN
15.0000 [IU] | Freq: Every day | SUBCUTANEOUS | Status: DC
  Administered 2013-07-08 – 2013-07-09 (×2): 15 [IU] via SUBCUTANEOUS
  Filled 2013-07-08 (×3): qty 0.15

## 2013-07-08 MED ORDER — INSULIN DETEMIR 100 UNIT/ML ~~LOC~~ SOLN
15.0000 [IU] | Freq: Every day | SUBCUTANEOUS | Status: DC
  Filled 2013-07-08: qty 0.15

## 2013-07-08 MED ORDER — ASPIRIN EC 81 MG PO TBEC
81.0000 mg | DELAYED_RELEASE_TABLET | Freq: Every day | ORAL | Status: DC
  Administered 2013-07-09 – 2013-07-13 (×5): 81 mg via ORAL
  Filled 2013-07-08 (×5): qty 1

## 2013-07-08 MED ORDER — INSULIN ASPART 100 UNIT/ML ~~LOC~~ SOLN
0.0000 [IU] | SUBCUTANEOUS | Status: DC
  Administered 2013-07-08 – 2013-07-09 (×5): 2 [IU] via SUBCUTANEOUS

## 2013-07-08 MED ORDER — FUROSEMIDE 10 MG/ML IJ SOLN
40.0000 mg | Freq: Once | INTRAMUSCULAR | Status: AC
  Administered 2013-07-08: 40 mg via INTRAVENOUS

## 2013-07-08 MED ORDER — POTASSIUM CHLORIDE 10 MEQ/50ML IV SOLN
10.0000 meq | INTRAVENOUS | Status: AC
  Administered 2013-07-08 (×2): 10 meq via INTRAVENOUS
  Filled 2013-07-08 (×2): qty 50

## 2013-07-08 MED ORDER — TICAGRELOR 90 MG PO TABS
90.0000 mg | ORAL_TABLET | Freq: Two times a day (BID) | ORAL | Status: DC
  Administered 2013-07-08 – 2013-07-13 (×11): 90 mg via ORAL
  Filled 2013-07-08 (×12): qty 1

## 2013-07-08 MED FILL — Potassium Chloride Inj 2 mEq/ML: INTRAVENOUS | Qty: 40 | Status: AC

## 2013-07-08 MED FILL — Heparin Sodium (Porcine) Inj 1000 Unit/ML: INTRAMUSCULAR | Qty: 30 | Status: AC

## 2013-07-08 MED FILL — Sodium Chloride IV Soln 0.9%: INTRAVENOUS | Qty: 1000 | Status: AC

## 2013-07-08 MED FILL — Magnesium Sulfate Inj 50%: INTRAMUSCULAR | Qty: 10 | Status: AC

## 2013-07-08 NOTE — Progress Notes (Signed)
[facility]  Subjective: No complaints.   Objective: Vital signs in last 24 hours: Temp:  [96.3 F (35.7 C)-99.3 F (37.4 C)] 99.3 F (37.4 C) (09/12 0700) Pulse Rate:  [80-92] 90 (09/12 0943) Resp:  [0-25] 19 (09/12 0700) BP: (79-115)/(56-81) 95/56 mmHg (09/12 0943) SpO2:  [96 %-100 %] 97 % (09/12 0700) Arterial Line BP: (80-137)/(45-79) 99/53 mmHg (09/12 0700) FiO2 (%):  [40 %-50 %] 40 % (09/11 1645) Weight:  [149 lb 5.8 oz (67.75 kg)-164 lb 14.5 oz (74.8 kg)] 164 lb 14.5 oz (74.8 kg) (09/12 0315)    Intake/Output from previous day: 09/11 0701 - 09/12 0700 In: 6205 [I.V.:4077; Blood:278; IV Piggyback:1850] Out: 4330 [Urine:2685; Blood:750; Chest Tube:895] Intake/Output this shift: Total I/O In: 367.8 [I.V.:217.8; IV Piggyback:150] Out: 1120 [Urine:930; Chest Tube:190]  Medications Current Facility-Administered Medications  Medication Dose Route Frequency Provider Last Rate Last Dose  . 0.45 % sodium chloride infusion   Intravenous Continuous Rowe Clack, PA-C 20 mL/hr at 07/07/13 1406    . 0.9 %  sodium chloride infusion   Intravenous Continuous Rowe Clack, PA-C 20 mL/hr at 07/07/13 1405    . 0.9 %  sodium chloride infusion  250 mL Intravenous Continuous Wayne E Gold, PA-C      . acetaminophen (TYLENOL) tablet 1,000 mg  1,000 mg Oral Q6H Wayne E Gold, PA-C   1,000 mg at 07/08/13 4098   Or  . acetaminophen (TYLENOL) solution 1,000 mg  1,000 mg Per Tube Q6H Wayne E Gold, PA-C      . albumin human 5 % solution 250 mL  250 mL Intravenous Q15 min PRN Wayne E Gold, PA-C   250 mL at 07/08/13 0022  . aspirin EC tablet 325 mg  325 mg Oral Daily Rowe Clack, PA-C   325 mg at 07/08/13 1191   Or  . aspirin chewable tablet 324 mg  324 mg Per Tube Daily Wayne E Gold, PA-C      . atorvastatin (LIPITOR) tablet 80 mg  80 mg Oral q1800 Wayne E Gold, PA-C      . bisacodyl (DULCOLAX) EC tablet 10 mg  10 mg Oral Daily Rowe Clack, PA-C   10 mg at 07/08/13  4782   Or  . bisacodyl (DULCOLAX) suppository 10 mg  10 mg Rectal Daily Wayne E Gold, PA-C      . cefUROXime (ZINACEF) 1.5 g in dextrose 5 % 50 mL IVPB  1.5 g Intravenous Q12H Wayne E Gold, PA-C   1.5 g at 07/08/13 0900  . dexmedetomidine (PRECEDEX) 200 MCG/50ML infusion  0.1-0.7 mcg/kg/hr Intravenous Continuous Wayne E Gold, PA-C   0.1 mcg/kg/hr at 07/07/13 1620  . docusate sodium (COLACE) capsule 200 mg  200 mg Oral Daily Wayne E Gold, PA-C   200 mg at 07/08/13 0941  . enoxaparin (LOVENOX) injection 40 mg  40 mg Subcutaneous QHS Loreli Slot, MD      . insulin aspart (novoLOG) injection 0-24 Units  0-24 Units Subcutaneous Q4H Loreli Slot, MD      . insulin detemir (LEVEMIR) injection 15 Units  15 Units Subcutaneous Q1200 Loreli Slot, MD      . insulin regular (NOVOLIN R,HUMULIN R) 1 Units/mL in sodium chloride 0.9 % 100 mL infusion   Intravenous Continuous Wayne E Gold, PA-C 1.6 mL/hr at 07/08/13 1000    . insulin regular bolus via infusion 0-10 Units  0-10 Units Intravenous TID WC Wayne E Gold, PA-C      .  lactated ringers infusion   Intravenous Continuous Rowe Clack, PA-C 20 mL/hr at 07/07/13 1441    . metoprolol (LOPRESSOR) injection 2.5-5 mg  2.5-5 mg Intravenous Q2H PRN Wayne E Gold, PA-C      . metoprolol tartrate (LOPRESSOR) tablet 12.5 mg  12.5 mg Oral BID Wayne E Gold, PA-C       Or  . metoprolol tartrate (LOPRESSOR) 25 mg/10 mL oral suspension 12.5 mg  12.5 mg Per Tube BID Wayne E Gold, PA-C      . midazolam (VERSED) injection 2 mg  2 mg Intravenous Q1H PRN Wayne E Gold, PA-C      . morphine 2 MG/ML injection 2-5 mg  2-5 mg Intravenous Q1H PRN Wayne E Gold, PA-C   2 mg at 07/08/13 0142  . nitroGLYCERIN 0.2 mg/mL in dextrose 5 % infusion  0-100 mcg/min Intravenous Continuous Wayne E Gold, PA-C   10 mcg/min at 07/07/13 1330  . ondansetron (ZOFRAN) injection 4 mg  4 mg Intravenous Q6H PRN Rowe Clack, PA-C   4 mg at 07/08/13 0311  . oxyCODONE (Oxy  IR/ROXICODONE) immediate release tablet 5-10 mg  5-10 mg Oral Q3H PRN Rowe Clack, PA-C   10 mg at 07/08/13 0831  . [START ON 07/09/2013] pantoprazole (PROTONIX) EC tablet 40 mg  40 mg Oral Daily Wayne E Gold, PA-C      . phenylephrine (NEO-SYNEPHRINE) 20,000 mcg in dextrose 5 % 250 mL infusion  0-100 mcg/min Intravenous Continuous Wayne E Gold, PA-C 7.5 mL/hr at 07/08/13 1000 10 mcg/min at 07/08/13 1000  . sodium chloride 0.9 % injection 3 mL  3 mL Intravenous Q12H Wayne E Gold, PA-C   3 mL at 07/08/13 0944  . sodium chloride 0.9 % injection 3 mL  3 mL Intravenous PRN Rowe Clack, PA-C      . Ticagrelor (BRILINTA) tablet 90 mg  90 mg Oral BID Loreli Slot, MD   90 mg at 07/08/13 0941    PE: General appearance: alert, cooperative and no distress Lungs: clear to auscultation bilaterally Heart: regular rate and rhythm and 3/6 Murmur best heard at the RUSB; ? Friction rub at apex vs murmur Extremities: no LEE Pulses: 2+ and symmetric Skin: warm and dry Neurologic: Grossly normal  Lab Results:   Recent Labs  07/07/13 1200  07/07/13 1850 07/07/13 1854 07/08/13 0100 07/08/13 0342  WBC 14.5*  --  15.3*  --   --  13.3*  HGB 9.3*  < > 9.6* 9.2* 8.5* 9.1*  HCT 26.5*  < > 27.3* 27.0* 25.0* 26.2*  PLT 124*  --  141*  --   --  140*  < > = values in this interval not displayed. BMET  Recent Labs  07/05/13 1110  07/07/13 1850 07/07/13 1854 07/08/13 0100 07/08/13 0342  NA 138  < >  --  139 138 133*  K 3.9  < >  --  4.5 4.3 4.1  CL 104  --   --  106  --  102  CO2 20  --   --   --   --  22  GLUCOSE 135*  < >  --  126* 111* 123*  BUN 9  --   --  5*  --  6  CREATININE 0.80  --  0.71 0.80  --  0.70  CALCIUM 9.4  --   --   --   --  7.7*  < > = values in this interval not displayed.  PT/INR  Recent Labs  07/05/13 1110 07/07/13 1200  LABPROT 13.2 16.8*  INR 1.02 1.40    Assessment/Plan  Principal Problem:   CAD s/p CABG x 2 (LIMA-LAD, SV-PDA) 07/07/13 (EF 60%)  Active  Problems:   STEMI (ST elevation myocardial infarction)- CFX BMS   Diabetes mellitus   Dyslipidemia, goal LDL below 70  Plan: Day 1 s/p CABG x 2 (LIMA-LAD, SV-PDA). BP is borderline hypotensive. He is on phenylephrine. No post-operative arrhythmias on telemetry. Brilinta was restarted today. He has a loud murmur, best heard at the RUSB. Recommend a 2D echo to evaluate. Continue with current plan of care per [facility]. We will continue to follow and will adjust cardiac meds as patient progresses and as BP allows. MD to follow.     LOS: 1 day    Nyjae Hodge M. Sharol Harness, PA-C 07/08/2013 10:35 AM

## 2013-07-08 NOTE — Op Note (Signed)
NAMEJAXIN, Steve Hickman                  ACCOUNT NO.:  [id number]  MEDICAL RECORD NO.:  [id number]  LOCATION:  2S05C                        FACILITY:  [facility]  PHYSICIAN:  Salvatore Decent. Dorris Fetch, M.D.DATE OF BIRTH:  July 20, 1941  DATE OF PROCEDURE:  07/07/2013 DATE OF DISCHARGE:                              OPERATIVE REPORT   PREOPERATIVE DIAGNOSIS:  Severe two-vessel coronary artery disease with recent myocardial infarction.  POSTOPERATIVE DIAGNOSIS:  Severe two-vessel coronary artery disease with recent myocardial infarction.  PROCEDURE:  Median sternotomy, extracorporeal circulation, coronary artery bypass grafting x2 (left internal mammary artery to left anterior descending, saphenous vein graft to posterior descending), endoscopic vein harvest left thigh.  SURGEON:  Salvatore Decent. Dorris Fetch, M.D.  ASSISTANT:  Rowe Clack, PA-C  ANESTHESIA:  General.  FINDINGS:  Vein in right leg small, vein from left leg good quality, mammary good quality. LAD and posterior descending both relatively small, diffusely diseased vessels, but both did accept a 1.5 mm probe.  CLINICAL NOTE:  Steve Hickman is a 72 year old gentleman who in early August experienced an acute MI.  He had emergent catheterization and angioplasty and stenting of circumflex, but had residual significant disease in the LAD and right coronary.  They were not amenable to percutaneous intervention.  He was referred for interval coronary artery bypass grafting.  The indications, risks, benefits, and alternatives were discussed in detail with the patient.  He understood and accepted the risks and agreed to proceed.  OPERATIVE NOTE:  Steve Hickman was brought to the preoperative holding area on July 07, 2013.  There Anesthesia placed a Swan-Ganz catheter and arterial blood pressure monitoring line.  Intravenous antibiotics were administered.  He was taken to the operating room, anesthetized, and intubated.  A Foley catheter was placed.   The chest, abdomen, and legs were prepped and draped in the usual sterile fashion.  An incision was made in the medial aspect of the right leg just above the knee.  A vein was identified at that site in the general vicinity of a greater saphenous vein.  However, the vein was small and did not appear to be suitable for use as a bypass graft.  No vein was harvested from the right leg. An incision was made in the medial aspect of the left leg above the knee.  The vein at that site was of better quality.  It was harvested endoscopically from lower thigh to groin and was a good quality conduit.  Simultaneously with the vein harvest, a median sternotomy was performed and the left internal mammary artery was harvested using standard technique under direct vision.  Heparin, 2000 Units, was administered during the vessel harvest.  The remainder of the full heparin dose was given prior to opening the pericardium.  After harvesting the conduits, the pericardium was opened.  The ascending aorta was inspected.  There was no palpable atherosclerotic disease.  The aorta was cannulated via concentric 2-0 Ethibond pledgeted pursestring sutures.  A dual-stage venous cannula was placed via a pursestring suture in the right atrial appendage.  After confirming adequate anticoagulation with ACT measurement, cardiopulmonary bypass was instituted and the patient was cooled to 32 degrees Celsius.  The  coronary arteries were inspected and anastomotic sites were chosen.  The conduits were inspected and cut to length.  A foam pad was placed in the pericardium to insulate the heart and protect the left phrenic nerve.  A temperature probe was placed in the myocardial septum and a cardioplegia cannula was placed in the ascending aorta.  The aorta was crossclamped.  The left ventricle was emptied via the aortic root vent.  Cardiac arrest then was achieved with a combination of cold antegrade blood cardioplegia and  topical iced saline.  1.5 L of cardioplegia was administered.  There was a rapid diastolic arrest and myocardial septal cooling to 12 degrees Celsius.  The following distal anastomoses were performed.  First, a reversed saphenous vein graft was placed end-to-side to the posterior descending branch of the right coronary.  This was a relatively small, diffusely diseased target.  There was some plaquing at the site of the anastomosis.  It was heavily diseased proximal to the anastomosis, but the probe did pass distally.  The vein graft was of good quality.  It was anastomosed end-to-side with a running 7-0 Prolene suture.  All anastomoses were probed proximally and distally at their completion to ensure patency.  Cardioplegia was administered through the vein graft to assess flow and hemostasis, both of which were good.  Next, the left internal mammary artery was brought through a window in the pericardium.  The distal end was beveled.  It was then anastomosed end- to-side to the distal LAD.  The LAD had diffuse posterior plaque.  It was diffusely diseased. The LAD did accept a 1.5 mm probe, which passed distally to the apex, as well as proximally to the tight stenoses.  The mammary was a 1.5 mm relatively small, but otherwise good quality vessel with good Flow. An end-to-side anastomosis was performed with a running 8-0 Prolene suture.  At the completion of the mammary to LAD anastomosis, the bulldog clamp was briefly removed to inspect for hemostasis. Immediate and rapid septal rewarming was noted.  The bulldog clamp was replaced.  The mammary pedicle was tacked to the epicardial surface of the heart with 6-0 Prolene sutures.  Additional cardioplegia was administered.  The vein graft was cut to length.  The cardioplegia cannula was removed from the ascending aorta and the proximal vein graft anastomosis was performed to a 4.5 mm punch aortotomy with a running 6-0 Prolene suture.  At the  completion of the proximal anastomosis, the patient was placed in Trendelenburg position. Lidocaine was administered.  The aortic root was de-aired.  The bulldog clamp was removed from the left mammary artery and the aortic crossclamp was removed.  The total crossclamp time was 50 minutes.  The patient was initially in heart block.  After removal of the cross clamp, he then went into ventricular fibrillation. He required a single defibrillation with 10 joules and then once again was in heart block.  While rewarming was completed, all proximal and distal anastomoses were inspected for hemostasis.  Epicardial pacing wires were placed on the right ventricle and right atrium.  When the patient had rewarmed to a core temperature of 37 degrees Celsius, he was weaned from cardiopulmonary bypass on the first attempt.  The total bypass time was 77 minutes.  The initial cardiac index was greater than 2 liters/minute/meter squared, and the patient remained hemodynamically stable throughout the postbypass period.  A test dose of protamine was administered and was well tolerated.  The atrial and aortic cannulae were removed.  The remainder of the protamine was administered without incident.  The chest was irrigated with warm saline.  Hemostasis was achieved.  The left pleural and single mediastinal chest tubes were placed in a separate subcostal incisions. The sternum was closed with a combination of single and double heavy gauge stainless steel wires. Pectoralis fascia, subcutaneous tissue, and skin were closed in the standard fashion.  All sponge, needle, and instrument counts were correct at the end of the procedure.  The patient was taken from the operating room to the surgical intensive care unit in good condition.     Salvatore Decent Dorris Fetch, M.D.     SCH/MEDQ  D:  07/07/2013  T:  07/08/2013  Job:  [id number]

## 2013-07-08 NOTE — Progress Notes (Signed)
Utilization review completed. Jaree Dwight, RN, BSN. 

## 2013-07-08 NOTE — Progress Notes (Signed)
1 Day Post-Op Procedure(s) (LRB): CORONARY ARTERY BYPASS GRAFTING (CABG) (N/A) Subjective: C/o irritation from foley "not much" pain  Objective: Vital signs in last 24 hours: Temp:  [96.3 F (35.7 C)-99.3 F (37.4 C)] 99.3 F (37.4 C) (09/12 0700) Pulse Rate:  [57-92] 90 (09/12 0700) Cardiac Rhythm:  [-] Atrial paced (09/12 0700) Resp:  [0-25] 19 (09/12 0700) BP: (79-115)/(56-81) 91/60 mmHg (09/12 0700) SpO2:  [96 %-100 %] 97 % (09/12 0700) Arterial Line BP: (80-137)/(45-79) 99/53 mmHg (09/12 0700) FiO2 (%):  [40 %-50 %] 40 % (09/11 1645) Weight:  [149 lb 5.8 oz (67.75 kg)-164 lb 14.5 oz (74.8 kg)] 164 lb 14.5 oz (74.8 kg) (09/12 0315)  Hemodynamic parameters for last 24 hours: PAP: (21-36)/(10-20) 29/13 mmHg CO:  [3.1 L/min-4.9 L/min] 4.3 L/min CI:  [1.7 L/min/m2-2.8 L/min/m2] 2.4 L/min/m2  Intake/Output from previous day: 09/11 0701 - 09/12 0700 In: 6205 [I.V.:4077; Blood:278; IV Piggyback:1850] Out: 4330 [Urine:2685; Blood:750; Chest Tube:895] Intake/Output this shift:    General appearance: alert and no distress Neurologic: intact Heart: RRR, + rub Lungs: diminished breath sounds bibasilar Abdomen: soft, nontender, minimal BS  Lab Results:  Recent Labs  07/07/13 1850  07/08/13 0100 07/08/13 0342  WBC 15.3*  --   --  13.3*  HGB 9.6*  < > 8.5* 9.1*  HCT 27.3*  < > 25.0* 26.2*  PLT 141*  --   --  140*  < > = values in this interval not displayed. BMET:  Recent Labs  07/05/13 1110  07/07/13 1854 07/08/13 0100 07/08/13 0342  NA 138  < > 139 138 133*  K 3.9  < > 4.5 4.3 4.1  CL 104  --  106  --  102  CO2 20  --   --   --  22  GLUCOSE 135*  < > 126* 111* 123*  BUN 9  --  5*  --  6  CREATININE 0.80  < > 0.80  --  0.70  CALCIUM 9.4  --   --   --  7.7*  < > = values in this interval not displayed.  PT/INR:  Recent Labs  07/07/13 1200  LABPROT 16.8*  INR 1.40   ABG    Component Value Date/Time   PHART 7.336* 07/07/2013 1821   HCO3 20.4 07/07/2013  1821   TCO2 21 07/07/2013 1854   ACIDBASEDEF 5.0* 07/07/2013 1821   O2SAT 94.0 07/07/2013 1821   CBG (last 3)   Recent Labs  07/08/13 0440 07/08/13 0527 07/08/13 0629  GLUCAP 111* 105* 116*    Assessment/Plan: S/P Procedure(s) (LRB): CORONARY ARTERY BYPASS GRAFTING (CABG) (N/A) POD # 1 CABG CV- BP relatively low, still on some neo- albumin to expand intravascular volume  Dc swan  Recent MI, stent- restart brilinta  RESP- bibasilar atelectasis- IS  RENAL- volume overloaded- diurese  Dc foley later today  Anemia secondary to ABL-  expected, follow  CBG well controlled, transition to SSI  DC CT  OOB, ambulate   LOS: 1 day    Emma Schupp C 07/08/2013

## 2013-07-08 NOTE — Progress Notes (Signed)
Patient ID: Steve Hickman, male   DOB: [birth year], 72 y.o.   MRN: [id number] EVENING ROUNDS NOTE :     [address]       [place] [address]             [phone]                 1 Day Post-Op Procedure(s) (LRB): CORONARY ARTERY BYPASS GRAFTING (CABG) (N/A)  Total Length of Stay:  LOS: 1 day  BP 95/56  Pulse 90  Temp(Src) 98.6 F (37 C) (Oral)  Resp 20  Ht 5\' 7"  (1.702 m)  Wt 164 lb 14.5 oz (74.8 kg)  BMI 25.82 kg/m2  SpO2 92%  .Intake/Output     09/11 0701 - 09/12 0700 09/12 0701 - 09/13 0700   I.V. (mL/kg) 4077 (54.5) 383 (5.1)   Blood 278    IV Piggyback 1850 150   Total Intake(mL/kg) 6205 (83) 533 (7.1)   Urine (mL/kg/hr) 2685 (1.5) 1480 (1.8)   Blood 750 (0.4)    Chest Tube 895 (0.5) 190 (0.2)   Total Output 4330 1670   Net +1875 -1137          . sodium chloride 20 mL/hr at 07/07/13 1406  . sodium chloride 20 mL/hr at 07/07/13 1405  . sodium chloride    . dexmedetomidine Stopped (07/07/13 1640)  . insulin (NOVOLIN-R) infusion Stopped (07/08/13 1400)  . lactated ringers 20 mL/hr at 07/07/13 1441  . nitroGLYCERIN Stopped (07/07/13 1400)  . phenylephrine (NEO-SYNEPHRINE) Adult infusion Stopped (07/08/13 1100)     Lab Results  Component Value Date   WBC 12.1* 07/08/2013   HGB 9.3* 07/08/2013   HCT 26.8* 07/08/2013   PLT 135* 07/08/2013   GLUCOSE 123* 07/08/2013   CHOL 138 05/30/2013   TRIG 116 05/30/2013   HDL 34* 05/30/2013   LDLCALC 81 05/30/2013   ALT 40 07/05/2013   AST 31 07/05/2013   NA 133* 07/08/2013   K 4.1 07/08/2013   CL 102 07/08/2013   CREATININE 0.70 07/08/2013   BUN 6 07/08/2013   CO2 22 07/08/2013   TSH 1.900 05/28/2013   INR 1.40 07/07/2013   HGBA1C 7.1* 07/05/2013   Up to chair eating, feels well Tubes and line out   Delight Ovens MD  Beeper [phone] Office [phone] 07/08/2013 5:54 PM

## 2013-07-08 NOTE — Progress Notes (Addendum)
Pt. Seen and examined. Agree with the NP/PA-C note as written.  Doing well post-op. Only complaint is feeling like he needs to urinate - may be having some bladder spasm. ?ditropan - will defer to TCTS.  Agree with echocardiogram today for loud murmur - he has never had one.  Chrystie Nose, MD, [facility] Attending Cardiologist [facility]

## 2013-07-09 ENCOUNTER — Inpatient Hospital Stay (HOSPITAL_COMMUNITY): Payer: Medicare Other

## 2013-07-09 DIAGNOSIS — R0609 Other forms of dyspnea: Secondary | ICD-10-CM

## 2013-07-09 DIAGNOSIS — I2510 Atherosclerotic heart disease of native coronary artery without angina pectoris: Secondary | ICD-10-CM

## 2013-07-09 DIAGNOSIS — I517 Cardiomegaly: Secondary | ICD-10-CM

## 2013-07-09 LAB — CBC
HCT: 27.1 % — ABNORMAL LOW (ref 39.0–52.0)
MCH: 32 pg (ref 26.0–34.0)
MCV: 92.2 fL (ref 78.0–100.0)
RBC: 2.94 MIL/uL — ABNORMAL LOW (ref 4.22–5.81)
RDW: 13.2 % (ref 11.5–15.5)
WBC: 11.8 10*3/uL — ABNORMAL HIGH (ref 4.0–10.5)

## 2013-07-09 LAB — GLUCOSE, CAPILLARY
Glucose-Capillary: 118 mg/dL — ABNORMAL HIGH (ref 70–99)
Glucose-Capillary: 122 mg/dL — ABNORMAL HIGH (ref 70–99)
Glucose-Capillary: 105 mg/dL — ABNORMAL HIGH (ref 70–99)

## 2013-07-09 LAB — BASIC METABOLIC PANEL
CO2: 25 mEq/L (ref 19–32)
Calcium: 8.4 mg/dL (ref 8.4–10.5)
Chloride: 100 mEq/L (ref 96–112)
Creatinine, Ser: 0.84 mg/dL (ref 0.50–1.35)
Glucose, Bld: 123 mg/dL — ABNORMAL HIGH (ref 70–99)

## 2013-07-09 LAB — TYPE AND SCREEN
ABO/RH(D): O POS
Unit division: 0
Unit division: 0

## 2013-07-09 MED ORDER — BISACODYL 5 MG PO TBEC: 10.0000 mg | DELAYED_RELEASE_TABLET | Freq: Every day | ORAL | Status: DC | PRN

## 2013-07-09 MED ORDER — ONDANSETRON HCL 4 MG PO TABS: 4.0000 mg | ORAL_TABLET | Freq: Four times a day (QID) | ORAL | Status: DC | PRN

## 2013-07-09 MED ORDER — FUROSEMIDE 20 MG PO TABS
20.0000 mg | ORAL_TABLET | Freq: Every day | ORAL | Status: AC
  Administered 2013-07-09 – 2013-07-11 (×3): 20 mg via ORAL
  Filled 2013-07-09 (×3): qty 1

## 2013-07-09 MED ORDER — ACETAMINOPHEN 325 MG PO TABS
650.0000 mg | ORAL_TABLET | Freq: Four times a day (QID) | ORAL | Status: DC | PRN
  Administered 2013-07-10: 650 mg via ORAL
  Filled 2013-07-09: qty 2

## 2013-07-09 MED ORDER — SODIUM CHLORIDE 0.9 % IV SOLN: 250.0000 mL | INTRAVENOUS | Status: DC | PRN

## 2013-07-09 MED ORDER — PANTOPRAZOLE SODIUM 40 MG PO TBEC
40.0000 mg | DELAYED_RELEASE_TABLET | Freq: Every day | ORAL | Status: DC
  Administered 2013-07-10 – 2013-07-13 (×4): 40 mg via ORAL
  Filled 2013-07-09 (×4): qty 1

## 2013-07-09 MED ORDER — INSULIN ASPART 100 UNIT/ML ~~LOC~~ SOLN
0.0000 [IU] | Freq: Three times a day (TID) | SUBCUTANEOUS | Status: DC
  Administered 2013-07-09 (×2): 2 [IU] via SUBCUTANEOUS
  Administered 2013-07-10: 4 [IU] via SUBCUTANEOUS
  Administered 2013-07-10 – 2013-07-11 (×3): 2 [IU] via SUBCUTANEOUS

## 2013-07-09 MED ORDER — DOCUSATE SODIUM 100 MG PO CAPS
200.0000 mg | ORAL_CAPSULE | Freq: Every day | ORAL | Status: DC
  Administered 2013-07-09 – 2013-07-13 (×3): 200 mg via ORAL
  Filled 2013-07-09 (×5): qty 2

## 2013-07-09 MED ORDER — SODIUM CHLORIDE 0.9 % IJ SOLN: 3.0000 mL | INTRAMUSCULAR | Status: DC | PRN

## 2013-07-09 MED ORDER — ENOXAPARIN SODIUM 30 MG/0.3ML ~~LOC~~ SOLN
30.0000 mg | Freq: Every day | SUBCUTANEOUS | Status: DC
  Administered 2013-07-09: 30 mg via SUBCUTANEOUS
  Filled 2013-07-09 (×2): qty 0.3

## 2013-07-09 MED ORDER — METOPROLOL TARTRATE 12.5 MG HALF TABLET
12.5000 mg | ORAL_TABLET | Freq: Two times a day (BID) | ORAL | Status: DC
  Administered 2013-07-09 – 2013-07-11 (×5): 12.5 mg via ORAL
  Filled 2013-07-09 (×6): qty 1

## 2013-07-09 MED ORDER — INSULIN ASPART 100 UNIT/ML ~~LOC~~ SOLN: 0.0000 [IU] | Freq: Three times a day (TID) | SUBCUTANEOUS | Status: DC

## 2013-07-09 MED ORDER — TRAMADOL HCL 50 MG PO TABS
50.0000 mg | ORAL_TABLET | ORAL | Status: DC | PRN
Start: 2013-07-09 — End: 2013-07-13
  Administered 2013-07-09 – 2013-07-11 (×3): 100 mg via ORAL
  Filled 2013-07-09 (×3): qty 2

## 2013-07-09 MED ORDER — ONDANSETRON HCL 4 MG/2ML IJ SOLN: 4.0000 mg | Freq: Four times a day (QID) | INTRAMUSCULAR | Status: DC | PRN

## 2013-07-09 MED ORDER — SODIUM CHLORIDE 0.9 % IJ SOLN: 3.0000 mL | Freq: Two times a day (BID) | INTRAMUSCULAR | Status: DC

## 2013-07-09 MED ORDER — POTASSIUM CHLORIDE CRYS ER 20 MEQ PO TBCR
20.0000 meq | EXTENDED_RELEASE_TABLET | Freq: Every day | ORAL | Status: AC
  Administered 2013-07-09 – 2013-07-11 (×3): 20 meq via ORAL
  Filled 2013-07-09 (×3): qty 1

## 2013-07-09 MED ORDER — OXYCODONE HCL 5 MG PO TABS
5.0000 mg | ORAL_TABLET | ORAL | Status: DC | PRN
Start: 2013-07-09 — End: 2013-07-13
  Administered 2013-07-09: 5 mg via ORAL
  Filled 2013-07-09: qty 2

## 2013-07-09 MED ORDER — BISACODYL 10 MG RE SUPP: 10.0000 mg | Freq: Every day | RECTAL | Status: DC | PRN

## 2013-07-09 MED ORDER — MOVING RIGHT ALONG BOOK
Freq: Once | Status: AC
  Administered 2013-07-09: 11:00:00
  Filled 2013-07-09: qty 1

## 2013-07-09 NOTE — Progress Notes (Signed)
  Echocardiogram 2D Echocardiogram has been performed.  Steve Hickman 07/09/2013, 3:25 PM

## 2013-07-09 NOTE — Plan of Care (Signed)
Problem: Phase III Progression Outcomes Goal: Time patient transferred to PCTU/Telemetry POD Outcome: Completed/Met Date Met:  07/09/13 1530 transferred to 2w25. Shavonda Wiedman, Randall An RN

## 2013-07-09 NOTE — Progress Notes (Signed)
2 Days Post-Op Procedure(s) (LRB): CORONARY ARTERY BYPASS GRAFTING (CABG) (N/A) Subjective: Up in chair feels well today  Objective: Vital signs in last 24 hours: Temp:  [98 F (36.7 C)-99 F (37.2 C)] 98.3 F (36.8 C) (09/13 0800) Pulse Rate:  [43-98] 94 (09/13 0800) Cardiac Rhythm:  [-] Atrial paced (09/13 0800) Resp:  [0-27] 22 (09/13 0800) BP: (88-130)/(56-78) 124/76 mmHg (09/13 0800) SpO2:  [83 %-100 %] 95 % (09/13 0800) Arterial Line BP: (103-134)/(56-74) 115/63 mmHg (09/12 1100) Weight:  [158 lb 8.2 oz (71.9 kg)] 158 lb 8.2 oz (71.9 kg) (09/13 0500)  Hemodynamic parameters for last 24 hours: PAP: (28-29)/(12-14) 28/12 mmHg  Intake/Output from previous day: 09/12 0701 - 09/13 0700 In: 1453 [P.O.:540; I.V.:713; IV Piggyback:200] Out: 2625 [ZOXWR:6045; Chest Tube:190] Intake/Output this shift: Total I/O In: 20 [I.V.:20] Out: 150 [Urine:150]  General appearance: alert and no distress Neurologic: intact Heart: RRR, + rub Lungs: diminished breath sounds bibasilar Abdomen: soft, nontender, minimal BS  Lab Results:  Recent Labs  07/08/13 1700 07/08/13 1733 07/09/13 0345  WBC 12.1*  --  11.8*  HGB 9.3* 10.2* 9.4*  HCT 26.8* 30.0* 27.1*  PLT 135*  --  126*   BMET:  Recent Labs  07/08/13 0342  07/08/13 1733 07/09/13 0345  NA 133*  --  136 134*  K 4.1  --  4.2 4.0  CL 102  --  99 100  CO2 22  --   --  25  GLUCOSE 123*  --  102* 123*  BUN 6  --  8 12  CREATININE 0.70  < > 0.90 0.84  CALCIUM 7.7*  --   --  8.4  < > = values in this interval not displayed.  PT/INR:   Recent Labs  07/07/13 1200  LABPROT 16.8*  INR 1.40   ABG    Component Value Date/Time   PHART 7.336* 07/07/2013 1821   HCO3 24.4* 07/08/2013 1709   TCO2 24 07/08/2013 1733   ACIDBASEDEF 5.0* 07/07/2013 1821   O2SAT 68.0 07/08/2013 1709   CBG (last 3)   Recent Labs  07/08/13 2332 07/09/13 0331 07/09/13 0741  GLUCAP 114* 122* 120*    Assessment/Plan: S/P Procedure(s)  (LRB): CORONARY ARTERY BYPASS GRAFTING (CABG) (N/A) POD # 2 CABG  CV- BP better off drips  Recent MI, stent- restart brilinta asa decreased to 81 mg  RESP- bibasilar atelectasis- IS  RENAL- volume overloaded- diurese   foley out voiding ok  Anemia secondary to ABL-  expected, follow  CBG well controlled, transition to SSI  To 2w /circle  OOB, ambulate   LOS: 2 days    Crystal Scarberry B 07/09/2013

## 2013-07-09 NOTE — Progress Notes (Signed)
Report given to [facility].  Pt to be transferred to 2W25 via wheelchair.  All VS have been WNL.  Pt has ambulated today with no difficulty.  Pacing wires were capped this AM with no arrhythmias.  Wife was aware of pending transfer.

## 2013-07-09 NOTE — Progress Notes (Addendum)
DAILY PROGRESS NOTE  Subjective:  No issues overnight. Foley removed. Main complaint today is shortness of breath - which he notices when he starts to fall asleep. This kept him up all night last night and he is fatigued today. CXR shows small left pleural effusion and atelectasis.  Objective:  Temp:  [98 F (36.7 C)-98.6 F (37 C)] 98.3 F (36.8 C) (09/13 0800) Pulse Rate:  [43-98] 94 (09/13 0800) Resp:  [0-27] 22 (09/13 0800) BP: (88-130)/(56-78) 124/76 mmHg (09/13 0800) SpO2:  [83 %-100 %] 95 % (09/13 0800) Arterial Line BP: (115-125)/(60-67) 115/63 mmHg (09/12 1100) Weight:  [158 lb 8.2 oz (71.9 kg)] 158 lb 8.2 oz (71.9 kg) (09/13 0500) Weight change: 9 lb 2.4 oz (4.15 kg)  Intake/Output from previous day: 09/12 0701 - 09/13 0700 In: 1453 [P.O.:540; I.V.:713; IV Piggyback:200] Out: 2625 [WUJWJ:1914; Chest Tube:190]  Intake/Output from this shift: Total I/O In: 20 [I.V.:20] Out: 150 [Urine:150]  Medications: Current Facility-Administered Medications  Medication Dose Route Frequency Provider Last Rate Last Dose  . acetaminophen (TYLENOL) tablet 650 mg  650 mg Oral Q6H PRN Delight Ovens, MD      . aspirin EC tablet 81 mg  81 mg Oral Daily Loreli Slot, MD      . atorvastatin (LIPITOR) tablet 80 mg  80 mg Oral q1800 Wayne E Gold, PA-C   80 mg at 07/08/13 1747  . bisacodyl (DULCOLAX) EC tablet 10 mg  10 mg Oral Daily Rowe Clack, PA-C   10 mg at 07/08/13 7829   Or  . bisacodyl (DULCOLAX) suppository 10 mg  10 mg Rectal Daily Wayne E Gold, PA-C      . docusate sodium (COLACE) capsule 200 mg  200 mg Oral Daily Delight Ovens, MD      . enoxaparin (LOVENOX) injection 30 mg  30 mg Subcutaneous QHS Delight Ovens, MD      . insulin aspart (novoLOG) injection 0-24 Units  0-24 Units Subcutaneous TID WC Delight Ovens, MD      . insulin detemir (LEVEMIR) injection 15 Units  15 Units Subcutaneous Q1200 Loreli Slot, MD   15 Units at 07/08/13 1218  .  metoprolol tartrate (LOPRESSOR) tablet 12.5 mg  12.5 mg Oral BID Delight Ovens, MD      . moving right along book   Does not apply Once Delight Ovens, MD      . ondansetron [facility]) tablet 4 mg  4 mg Oral Q6H PRN Delight Ovens, MD       Or  . ondansetron [facility]) injection 4 mg  4 mg Intravenous Q6H PRN Delight Ovens, MD      . oxyCODONE (Oxy IR/ROXICODONE) immediate release tablet 5-10 mg  5-10 mg Oral Q3H PRN Delight Ovens, MD      . Melene Muller ON 07/10/2013] pantoprazole (PROTONIX) EC tablet 40 mg  40 mg Oral QAC breakfast Delight Ovens, MD      . Ticagrelor [facility]) tablet 90 mg  90 mg Oral BID Loreli Slot, MD   90 mg at 07/08/13 2126  . traMADol (ULTRAM) tablet 50-100 mg  50-100 mg Oral Q4H PRN Delight Ovens, MD        Physical Exam: General appearance: no distress Neck: no adenopathy, no carotid bruit, no JVD, supple, symmetrical, trachea midline and thyroid not enlarged, symmetric, no tenderness/mass/nodules Lungs: dullness to percussion LLL Heart: regular rate and rhythm, S1, S2 normal, no M/R/G's Abdomen: soft,  non-tender; bowel sounds normal; no masses,  no organomegaly Extremities: extremities normal, atraumatic, no cyanosis or edema Pulses: 2+ and symmetric Skin: Skin color, texture, turgor normal. No rashes or lesions Neurologic: Grossly normal  Lab Results: Results for orders placed during the hospital encounter of 07/07/13 (from the past 48 hour(s))  POCT I-STAT 4, (NA,K, GLUC, HGB,HCT)     Status: Abnormal   Collection Time    07/07/13  9:57 AM      Result Value Range   Sodium 141  135 - 145 mEq/L   Potassium 3.6  3.5 - 5.1 mEq/L   Glucose, Bld 146 (*) 70 - 99 mg/dL   HCT 16.1 (*) 09.6 - 04.5 %   Hemoglobin 10.9 (*) 13.0 - 17.0 g/dL  POCT I-STAT 4, (NA,K, GLUC, HGB,HCT)     Status: Abnormal   Collection Time    07/07/13 10:25 AM      Result Value Range   Sodium 135  135 - 145 mEq/L   Potassium 6.0 (*) 3.5 - 5.1 mEq/L   Glucose,  Bld 139 (*) 70 - 99 mg/dL   HCT 40.9 (*) 81.1 - 91.4 %   Hemoglobin 7.8 (*) 13.0 - 17.0 g/dL  POCT I-STAT 3, BLOOD GAS (G3+)     Status: Abnormal   Collection Time    07/07/13 10:29 AM      Result Value Range   pH, Arterial 7.441  7.350 - 7.450   pCO2 arterial 35.5  35.0 - 45.0 mmHg   pO2, Arterial 319.0 (*) 80.0 - 100.0 mmHg   Bicarbonate 24.2 (*) 20.0 - 24.0 mEq/L   TCO2 25  0 - 100 mmol/L   O2 Saturation 100.0     Sample type ARTERIAL    PLATELET COUNT     Status: Abnormal   Collection Time    07/07/13 10:50 AM      Result Value Range   Platelets 138 (*) 150 - 400 K/uL   Comment: RESULT CALLED TO, READ BACK BY AND VERIFIED WITH:     B KNAB,RN AT 1110 07/07/13 BY K BARR     REPEATED TO VERIFY     DELTA CHECK NOTED  HEMOGLOBIN AND HEMATOCRIT, BLOOD     Status: Abnormal   Collection Time    07/07/13 10:50 AM      Result Value Range   Hemoglobin 8.6 (*) 13.0 - 17.0 g/dL   Comment: RESULT CALLED TO, READ BACK BY AND VERIFIED WITH:     B KNAB,RN AT 1110 07/07/13 BY K BARR   HCT 24.2 (*) 39.0 - 52.0 %   Comment: RESULT CALLED TO, READ BACK BY AND VERIFIED WITH:     B KNAB,RN AT 1110 07/07/13 BY K BARR  POCT I-STAT 4, (NA,K, GLUC, HGB,HCT)     Status: Abnormal   Collection Time    07/07/13 11:45 AM      Result Value Range   Sodium 142  135 - 145 mEq/L   Potassium 3.9  3.5 - 5.1 mEq/L   Glucose, Bld 142 (*) 70 - 99 mg/dL   HCT 78.2 (*) 95.6 - 21.3 %   Hemoglobin 8.8 (*) 13.0 - 17.0 g/dL  POCT I-STAT 3, BLOOD GAS (G3+)     Status: Abnormal   Collection Time    07/07/13 11:48 AM      Result Value Range   pH, Arterial 7.415  7.350 - 7.450   pCO2 arterial 36.9  35.0 - 45.0 mmHg   pO2, Arterial  145.0 (*) 80.0 - 100.0 mmHg   Bicarbonate 23.7  20.0 - 24.0 mEq/L   TCO2 25  0 - 100 mmol/L   O2 Saturation 99.0     Acid-base deficit 1.0  0.0 - 2.0 mmol/L   Sample type ARTERIAL    CBC     Status: Abnormal   Collection Time    07/07/13 12:00 PM      Result Value Range   WBC 14.5  (*) 4.0 - 10.5 K/uL   RBC 2.91 (*) 4.22 - 5.81 MIL/uL   Hemoglobin 9.3 (*) 13.0 - 17.0 g/dL   HCT 02.7 (*) 25.3 - 66.4 %   MCV 91.1  78.0 - 100.0 fL   MCH 32.0  26.0 - 34.0 pg   MCHC 35.1  30.0 - 36.0 g/dL   RDW 40.3  47.4 - 25.9 %   Platelets 124 (*) 150 - 400 K/uL  PROTIME-INR     Status: Abnormal   Collection Time    07/07/13 12:00 PM      Result Value Range   Prothrombin Time 16.8 (*) 11.6 - 15.2 seconds   INR 1.40  0.00 - 1.49  APTT     Status: None   Collection Time    07/07/13 12:00 PM      Result Value Range   aPTT 37  24 - 37 seconds   Comment:            IF BASELINE aPTT IS ELEVATED,     SUGGEST PATIENT RISK ASSESSMENT     BE USED TO DETERMINE APPROPRIATE     ANTICOAGULANT THERAPY.  POCT I-STAT 4, (NA,K, GLUC, HGB,HCT)     Status: Abnormal   Collection Time    07/07/13 12:46 PM      Result Value Range   Sodium 142  135 - 145 mEq/L   Potassium 3.4 (*) 3.5 - 5.1 mEq/L   Glucose, Bld 123 (*) 70 - 99 mg/dL   HCT 56.3 (*) 87.5 - 64.3 %   Hemoglobin 9.2 (*) 13.0 - 17.0 g/dL  POCT I-STAT 3, BLOOD GAS (G3+)     Status: Abnormal   Collection Time    07/07/13 12:50 PM      Result Value Range   pH, Arterial 7.419  7.350 - 7.450   pCO2 arterial 34.7 (*) 35.0 - 45.0 mmHg   pO2, Arterial 73.0 (*) 80.0 - 100.0 mmHg   Bicarbonate 22.7  20.0 - 24.0 mEq/L   TCO2 24  0 - 100 mmol/L   O2 Saturation 96.0     Acid-base deficit 2.0  0.0 - 2.0 mmol/L   Patient temperature 35.8 C     Collection site ARTERIAL LINE     Drawn by Operator     Sample type ARTERIAL    GLUCOSE, CAPILLARY     Status: Abnormal   Collection Time    07/07/13  1:55 PM      Result Value Range   Glucose-Capillary 104 (*) 70 - 99 mg/dL  GLUCOSE, CAPILLARY     Status: None   Collection Time    07/07/13  3:00 PM      Result Value Range   Glucose-Capillary 96  70 - 99 mg/dL  GLUCOSE, CAPILLARY     Status: None   Collection Time    07/07/13  4:05 PM      Result Value Range   Glucose-Capillary 98  70 - 99  mg/dL  POCT I-STAT 3, BLOOD GAS (G3+)  Status: Abnormal   Collection Time    07/07/13  5:06 PM      Result Value Range   pH, Arterial 7.335 (*) 7.350 - 7.450   pCO2 arterial 40.7  35.0 - 45.0 mmHg   pO2, Arterial 97.0  80.0 - 100.0 mmHg   Bicarbonate 21.9  20.0 - 24.0 mEq/L   TCO2 23  0 - 100 mmol/L   O2 Saturation 97.0     Acid-base deficit 4.0 (*) 0.0 - 2.0 mmol/L   Patient temperature 36.4 C     Sample type ARTERIAL    GLUCOSE, CAPILLARY     Status: Abnormal   Collection Time    07/07/13  5:08 PM      Result Value Range   Glucose-Capillary 116 (*) 70 - 99 mg/dL  GLUCOSE, CAPILLARY     Status: Abnormal   Collection Time    07/07/13  6:20 PM      Result Value Range   Glucose-Capillary 117 (*) 70 - 99 mg/dL  POCT I-STAT 3, BLOOD GAS (G3+)     Status: Abnormal   Collection Time    07/07/13  6:21 PM      Result Value Range   pH, Arterial 7.336 (*) 7.350 - 7.450   pCO2 arterial 38.1  35.0 - 45.0 mmHg   pO2, Arterial 76.0 (*) 80.0 - 100.0 mmHg   Bicarbonate 20.4  20.0 - 24.0 mEq/L   TCO2 22  0 - 100 mmol/L   O2 Saturation 94.0     Acid-base deficit 5.0 (*) 0.0 - 2.0 mmol/L   Patient temperature 36.7 C     Sample type ARTERIAL    CBC     Status: Abnormal   Collection Time    07/07/13  6:50 PM      Result Value Range   WBC 15.3 (*) 4.0 - 10.5 K/uL   RBC 3.00 (*) 4.22 - 5.81 MIL/uL   Hemoglobin 9.6 (*) 13.0 - 17.0 g/dL   HCT 29.5 (*) 62.1 - 30.8 %   MCV 91.0  78.0 - 100.0 fL   MCH 32.0  26.0 - 34.0 pg   MCHC 35.2  30.0 - 36.0 g/dL   RDW 65.7  84.6 - 96.2 %   Platelets 141 (*) 150 - 400 K/uL  MAGNESIUM     Status: Abnormal   Collection Time    07/07/13  6:50 PM      Result Value Range   Magnesium 3.0 (*) 1.5 - 2.5 mg/dL  CREATININE, SERUM     Status: None   Collection Time    07/07/13  6:50 PM      Result Value Range   Creatinine, Ser 0.71  0.50 - 1.35 mg/dL   GFR calc non Af Amer >90  >90 mL/min   GFR calc Af Amer >90  >90 mL/min   Comment: (NOTE)     The  eGFR has been calculated using the CKD EPI equation.     This calculation has not been validated in all clinical situations.     eGFR's persistently <90 mL/min signify possible Chronic Kidney     Disease.  POCT I-STAT, CHEM 8     Status: Abnormal   Collection Time    07/07/13  6:54 PM      Result Value Range   Sodium 139  135 - 145 mEq/L   Potassium 4.5  3.5 - 5.1 mEq/L   Chloride 106  96 - 112 mEq/L   BUN 5 (*)  6 - 23 mg/dL   Creatinine, Ser 1.61  0.50 - 1.35 mg/dL   Glucose, Bld 096 (*) 70 - 99 mg/dL   Calcium, Ion 0.45  4.09 - 1.30 mmol/L   TCO2 21  0 - 100 mmol/L   Hemoglobin 9.2 (*) 13.0 - 17.0 g/dL   HCT 81.1 (*) 91.4 - 78.2 %  GLUCOSE, CAPILLARY     Status: Abnormal   Collection Time    07/07/13  7:24 PM      Result Value Range   Glucose-Capillary 125 (*) 70 - 99 mg/dL  GLUCOSE, CAPILLARY     Status: Abnormal   Collection Time    07/07/13  8:16 PM      Result Value Range   Glucose-Capillary 155 (*) 70 - 99 mg/dL  GLUCOSE, CAPILLARY     Status: Abnormal   Collection Time    07/07/13  9:20 PM      Result Value Range   Glucose-Capillary 128 (*) 70 - 99 mg/dL  GLUCOSE, CAPILLARY     Status: Abnormal   Collection Time    07/07/13 10:27 PM      Result Value Range   Glucose-Capillary 118 (*) 70 - 99 mg/dL  GLUCOSE, CAPILLARY     Status: Abnormal   Collection Time    07/07/13 11:32 PM      Result Value Range   Glucose-Capillary 113 (*) 70 - 99 mg/dL  GLUCOSE, CAPILLARY     Status: Abnormal   Collection Time    07/08/13 12:29 AM      Result Value Range   Glucose-Capillary 107 (*) 70 - 99 mg/dL  POCT I-STAT 4, (NA,K, GLUC, HGB,HCT)     Status: Abnormal   Collection Time    07/08/13  1:00 AM      Result Value Range   Sodium 138  135 - 145 mEq/L   Potassium 4.3  3.5 - 5.1 mEq/L   Glucose, Bld 111 (*) 70 - 99 mg/dL   HCT 95.6 (*) 21.3 - 08.6 %   Hemoglobin 8.5 (*) 13.0 - 17.0 g/dL  GLUCOSE, CAPILLARY     Status: Abnormal   Collection Time    07/08/13  1:34 AM       Result Value Range   Glucose-Capillary 108 (*) 70 - 99 mg/dL  GLUCOSE, CAPILLARY     Status: Abnormal   Collection Time    07/08/13  2:38 AM      Result Value Range   Glucose-Capillary 107 (*) 70 - 99 mg/dL  GLUCOSE, CAPILLARY     Status: Abnormal   Collection Time    07/08/13  3:34 AM      Result Value Range   Glucose-Capillary 115 (*) 70 - 99 mg/dL  CBC     Status: Abnormal   Collection Time    07/08/13  3:42 AM      Result Value Range   WBC 13.3 (*) 4.0 - 10.5 K/uL   RBC 2.88 (*) 4.22 - 5.81 MIL/uL   Hemoglobin 9.1 (*) 13.0 - 17.0 g/dL   HCT 57.8 (*) 46.9 - 62.9 %   MCV 91.0  78.0 - 100.0 fL   MCH 31.6  26.0 - 34.0 pg   MCHC 34.7  30.0 - 36.0 g/dL   RDW 52.8  41.3 - 24.4 %   Platelets 140 (*) 150 - 400 K/uL  BASIC METABOLIC PANEL     Status: Abnormal   Collection Time    07/08/13  3:42 AM  Result Value Range   Sodium 133 (*) 135 - 145 mEq/L   Potassium 4.1  3.5 - 5.1 mEq/L   Chloride 102  96 - 112 mEq/L   CO2 22  19 - 32 mEq/L   Glucose, Bld 123 (*) 70 - 99 mg/dL   BUN 6  6 - 23 mg/dL   Creatinine, Ser 1.61  0.50 - 1.35 mg/dL   Calcium 7.7 (*) 8.4 - 10.5 mg/dL   GFR calc non Af Amer >90  >90 mL/min   GFR calc Af Amer >90  >90 mL/min   Comment: (NOTE)     The eGFR has been calculated using the CKD EPI equation.     This calculation has not been validated in all clinical situations.     eGFR's persistently <90 mL/min signify possible Chronic Kidney     Disease.  MAGNESIUM     Status: None   Collection Time    07/08/13  3:42 AM      Result Value Range   Magnesium 2.3  1.5 - 2.5 mg/dL  GLUCOSE, CAPILLARY     Status: Abnormal   Collection Time    07/08/13  4:40 AM      Result Value Range   Glucose-Capillary 111 (*) 70 - 99 mg/dL  GLUCOSE, CAPILLARY     Status: Abnormal   Collection Time    07/08/13  5:27 AM      Result Value Range   Glucose-Capillary 105 (*) 70 - 99 mg/dL  GLUCOSE, CAPILLARY     Status: Abnormal   Collection Time    07/08/13  6:29 AM       Result Value Range   Glucose-Capillary 116 (*) 70 - 99 mg/dL  GLUCOSE, CAPILLARY     Status: None   Collection Time    07/08/13  7:53 AM      Result Value Range   Glucose-Capillary 99  70 - 99 mg/dL   Comment 1 Documented in Chart     Comment 2 Notify RN    GLUCOSE, CAPILLARY     Status: Abnormal   Collection Time    07/08/13  9:02 AM      Result Value Range   Glucose-Capillary 101 (*) 70 - 99 mg/dL   Comment 1 Documented in Chart     Comment 2 Notify RN    GLUCOSE, CAPILLARY     Status: Abnormal   Collection Time    07/08/13 10:11 AM      Result Value Range   Glucose-Capillary 112 (*) 70 - 99 mg/dL   Comment 1 Notify RN     Comment 2 Documented in Chart    GLUCOSE, CAPILLARY     Status: Abnormal   Collection Time    07/08/13 11:23 AM      Result Value Range   Glucose-Capillary 123 (*) 70 - 99 mg/dL   Comment 1 Notify RN    GLUCOSE, CAPILLARY     Status: Abnormal   Collection Time    07/08/13 12:00 PM      Result Value Range   Glucose-Capillary 118 (*) 70 - 99 mg/dL  GLUCOSE, CAPILLARY     Status: Abnormal   Collection Time    07/08/13  1:09 PM      Result Value Range   Glucose-Capillary 112 (*) 70 - 99 mg/dL   Comment 1 Notify RN    GLUCOSE, CAPILLARY     Status: Abnormal   Collection Time    07/08/13  3:42  PM      Result Value Range   Glucose-Capillary 127 (*) 70 - 99 mg/dL   Comment 1 Notify RN    MAGNESIUM     Status: None   Collection Time    07/08/13  5:00 PM      Result Value Range   Magnesium 2.2  1.5 - 2.5 mg/dL  CBC     Status: Abnormal   Collection Time    07/08/13  5:00 PM      Result Value Range   WBC 12.1 (*) 4.0 - 10.5 K/uL   RBC 2.93 (*) 4.22 - 5.81 MIL/uL   Hemoglobin 9.3 (*) 13.0 - 17.0 g/dL   HCT 16.1 (*) 09.6 - 04.5 %   MCV 91.5  78.0 - 100.0 fL   MCH 31.7  26.0 - 34.0 pg   MCHC 34.7  30.0 - 36.0 g/dL   RDW 40.9  81.1 - 91.4 %   Platelets 135 (*) 150 - 400 K/uL  CREATININE, SERUM     Status: Abnormal   Collection Time     07/08/13  5:00 PM      Result Value Range   Creatinine, Ser 0.86  0.50 - 1.35 mg/dL   GFR calc non Af Amer 83 (*) >90 mL/min   GFR calc Af Amer >90  >90 mL/min   Comment: (NOTE)     The eGFR has been calculated using the CKD EPI equation.     This calculation has not been validated in all clinical situations.     eGFR's persistently <90 mL/min signify possible Chronic Kidney     Disease.  POCT I-STAT 3, BLOOD GAS (G3P V)     Status: Abnormal   Collection Time    07/08/13  5:09 PM      Result Value Range   pH, Ven 7.395 (*) 7.250 - 7.300   pCO2, Ven 40.0 (*) 45.0 - 50.0 mmHg   pO2, Ven 36.0  30.0 - 45.0 mmHg   Bicarbonate 24.4 (*) 20.0 - 24.0 mEq/L   TCO2 26  0 - 100 mmol/L   O2 Saturation 68.0     Patient temperature 98.9 F     Collection site ARTERIAL LINE     Drawn by Nurse     Sample type VENOUS    POCT I-STAT, CHEM 8     Status: Abnormal   Collection Time    07/08/13  5:33 PM      Result Value Range   Sodium 136  135 - 145 mEq/L   Potassium 4.2  3.5 - 5.1 mEq/L   Chloride 99  96 - 112 mEq/L   BUN 8  6 - 23 mg/dL   Creatinine, Ser 7.82  0.50 - 1.35 mg/dL   Glucose, Bld 956 (*) 70 - 99 mg/dL   Calcium, Ion 2.13  0.86 - 1.30 mmol/L   TCO2 24  0 - 100 mmol/L   Hemoglobin 10.2 (*) 13.0 - 17.0 g/dL   HCT 57.8 (*) 46.9 - 62.9 %  GLUCOSE, CAPILLARY     Status: Abnormal   Collection Time    07/08/13  7:24 PM      Result Value Range   Glucose-Capillary 138 (*) 70 - 99 mg/dL   Comment 1 Documented in Chart     Comment 2 Notify RN    GLUCOSE, CAPILLARY     Status: Abnormal   Collection Time    07/08/13 11:32 PM      Result Value Range  Glucose-Capillary 114 (*) 70 - 99 mg/dL   Comment 1 Documented in Chart     Comment 2 Notify RN    GLUCOSE, CAPILLARY     Status: Abnormal   Collection Time    07/09/13  3:31 AM      Result Value Range   Glucose-Capillary 122 (*) 70 - 99 mg/dL   Comment 1 Documented in Chart     Comment 2 Notify RN    BASIC METABOLIC PANEL      Status: Abnormal   Collection Time    07/09/13  3:45 AM      Result Value Range   Sodium 134 (*) 135 - 145 mEq/L   Potassium 4.0  3.5 - 5.1 mEq/L   Chloride 100  96 - 112 mEq/L   CO2 25  19 - 32 mEq/L   Glucose, Bld 123 (*) 70 - 99 mg/dL   BUN 12  6 - 23 mg/dL   Creatinine, Ser 1.61  0.50 - 1.35 mg/dL   Calcium 8.4  8.4 - 09.6 mg/dL   GFR calc non Af Amer 84 (*) >90 mL/min   GFR calc Af Amer >90  >90 mL/min   Comment: (NOTE)     The eGFR has been calculated using the CKD EPI equation.     This calculation has not been validated in all clinical situations.     eGFR's persistently <90 mL/min signify possible Chronic Kidney     Disease.  CBC     Status: Abnormal   Collection Time    07/09/13  3:45 AM      Result Value Range   WBC 11.8 (*) 4.0 - 10.5 K/uL   RBC 2.94 (*) 4.22 - 5.81 MIL/uL   Hemoglobin 9.4 (*) 13.0 - 17.0 g/dL   HCT 04.5 (*) 40.9 - 81.1 %   MCV 92.2  78.0 - 100.0 fL   MCH 32.0  26.0 - 34.0 pg   MCHC 34.7  30.0 - 36.0 g/dL   RDW 91.4  78.2 - 95.6 %   Platelets 126 (*) 150 - 400 K/uL  GLUCOSE, CAPILLARY     Status: Abnormal   Collection Time    07/09/13  7:41 AM      Result Value Range   Glucose-Capillary 120 (*) 70 - 99 mg/dL   Comment 1 Documented in Chart     Comment 2 Notify RN      Imaging: Dg Chest Port 1 View  07/09/2013   CLINICAL DATA:  Postop cardiac surgery  EXAM: PORTABLE CHEST - 1 VIEW  COMPARISON:  07/08/2013  FINDINGS: Small left pleural effusion with associated left lower lobe atelectasis. No pleural effusion or pneumothorax.  Interval removal of left chest tube, mediastinal drain, and right IJ Swan-Ganz catheter.  Right IJ venous sheath.  Cardiomegaly. Postsurgical changes related to prior CABG.  IMPRESSION: Small left pleural effusion with associated left basilar atelectasis.  No pneumothorax   Electronically Signed   By: Charline Bills M.D.   On: 07/09/2013 07:39   Dg Chest Portable 1 View In Am  07/08/2013   CLINICAL DATA:  Status post  CABG.  EXAM: PORTABLE CHEST - 1 VIEW  COMPARISON:  Chest x-ray 07/07/2013.  FINDINGS: Right internal jugular central venous cord history which a Swan-Ganz catheter has been passed into the pulmonic trunk. Mediastinal drain. Left-sided chest tube in position with tip and side port projecting over the left mid hemithorax. Previously noted nasogastric and endotracheal tubes have been removed. Lung volumes  remain low. There are bibasilar opacities (left greater than right), compatible with resolving postoperative subsegmental atelectasis. Trace left pleural effusion. No significant pneumothorax identified at this time. No evidence of pulmonary edema. Cardiopericardial silhouette appears borderline enlarged, within normal limits for a postoperative patient. Status post median sternotomy for CABG. Atherosclerosis in the thoracic aorta.  IMPRESSION: 1. Postoperative changes and support apparatus, as above. 2. Low lung volumes with minimal bibasilar subsegmental atelectasis (left greater than right) and trace left pleural effusion.   Electronically Signed   By: Trudie Reed M.D.   On: 07/08/2013 07:42   Dg Chest Portable 1 View  07/07/2013   CLINICAL DATA:  Postop PROCEDURE.  CABG.  EXAM: PORTABLE CHEST - 1 VIEW  COMPARISON:  07/05/2013  FINDINGS: Endotracheal tube is 3 cm above the carina. Swan-Ganz catheter is in the main pulmonary artery. NG tube tip is in the distal esophagus with the side port in the mid esophagus. Left chest tube in place without visible pneumothorax.  Areas of left mid and lower lung atelectasis. Low lung volumes.  IMPRESSION: Support devices as above. NG tube tip in the distal esophagus. No pneumothorax. Left mid and lower lung atelectasis.   Electronically Signed   By: Charlett Nose M.D.   On: 07/07/2013 12:54    Assessment:  1. Principal Problem: 2.   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 Active Problems: 4.   STEMI (ST elevation myocardial infarction)- CFX  BMS 5.   Diabetes mellitus 6.   Dyslipidemia, goal LDL below 70 7.   Plan:  1. Main complaint is dyspnea . He does have a left pleural effusion and atelectasis. Agree with IS use. Will review his echocardiogram this morning - rub noted yesterday along with murmur, the murmur is softer today - ?related to chest tubes. I agree with diuresis for volume overload. Recommend lasix 40 mg IV x 1 today. Consider re-check BNP in the am tomorrow and dose accordingly.  Time Spent Directly with Patient:  15 minutes  Length of Stay:  LOS: 2 days   Chrystie Nose, MD, [facility] Attending Cardiologist [facility]  HILTY,Kenneth C 07/09/2013, 9:46 AM

## 2013-07-10 ENCOUNTER — Inpatient Hospital Stay (HOSPITAL_COMMUNITY): Payer: Medicare Other

## 2013-07-10 DIAGNOSIS — E785 Hyperlipidemia, unspecified: Secondary | ICD-10-CM

## 2013-07-10 LAB — CBC
HCT: 28.6 % — ABNORMAL LOW (ref 39.0–52.0)
Hemoglobin: 9.9 g/dL — ABNORMAL LOW (ref 13.0–17.0)
MCH: 31.6 pg (ref 26.0–34.0)
MCHC: 34.6 g/dL (ref 30.0–36.0)
MCV: 91.4 fL (ref 78.0–100.0)
Platelets: 185 10*3/uL (ref 150–400)
RBC: 3.13 MIL/uL — ABNORMAL LOW (ref 4.22–5.81)
RDW: 13.2 % (ref 11.5–15.5)
WBC: 13.3 10*3/uL — ABNORMAL HIGH (ref 4.0–10.5)

## 2013-07-10 LAB — BASIC METABOLIC PANEL
BUN: 16 mg/dL (ref 6–23)
CO2: 23 mEq/L (ref 19–32)
Calcium: 8.6 mg/dL (ref 8.4–10.5)
Chloride: 97 mEq/L (ref 96–112)
Creatinine, Ser: 0.78 mg/dL (ref 0.50–1.35)
GFR calc Af Amer: >90 mL/min (ref >90)
GFR calc non Af Amer: 87 mL/min — ABNORMAL LOW (ref >90)
Glucose, Bld: 125 mg/dL — ABNORMAL HIGH (ref 70–99)
Potassium: 3.9 mEq/L (ref 3.5–5.1)
Sodium: 133 mEq/L — ABNORMAL LOW (ref 135–145)

## 2013-07-10 LAB — GLUCOSE, CAPILLARY: Glucose-Capillary: 194 mg/dL — ABNORMAL HIGH (ref 70–99)

## 2013-07-10 MED ORDER — ENOXAPARIN SODIUM 40 MG/0.4ML ~~LOC~~ SOLN
40.0000 mg | SUBCUTANEOUS | Status: DC
  Administered 2013-07-10 – 2013-07-12 (×3): 40 mg via SUBCUTANEOUS
  Filled 2013-07-10 (×4): qty 0.4

## 2013-07-10 NOTE — Progress Notes (Addendum)
[address]       [place] [address]             [phone]      3 Days Post-Op  Procedure(s) (LRB): CORONARY ARTERY BYPASS GRAFTING (CABG) (N/A) Subjective: Had short term afib with RVR, now in sinus  Objective  Telemetry sinus rhythm  Temp:  [97.7 F (36.5 C)-98.8 F (37.1 C)] 98.5 F (36.9 C) (09/14 0358) Pulse Rate:  [68-106] 86 (09/14 0358) Resp:  [8-26] 18 (09/14 0358) BP: (98-141)/(57-72) 121/71 mmHg (09/14 0954) SpO2:  [94 %-100 %] 96 % (09/14 0358) Weight:  [151 lb 14.4 oz (68.9 kg)] 151 lb 14.4 oz (68.9 kg) (09/14 0500)   Intake/Output Summary (Last 24 hours) at 07/10/13 1026 Last data filed at 07/10/13 0359  Gross per 24 hour  Intake    240 ml  Output   1100 ml  Net   -860 ml       General appearance: alert, cooperative and no distress Heart: regular rate and rhythm Lungs: clear to auscultation bilaterally Abdomen: benign Extremities: no edema Wound: incis dressed, CDI  Lab Results:  Recent Labs  07/08/13 0342 07/08/13 1700  07/09/13 0345 07/10/13 0458  NA 133*  --   < > 134* 133*  K 4.1  --   < > 4.0 3.9  CL 102  --   < > 100 97  CO2 22  --   --  25 23  GLUCOSE 123*  --   < > 123* 125*  BUN 6  --   < > 12 16  CREATININE 0.70 0.86  < > 0.84 0.78  CALCIUM 7.7*  --   --  8.4 8.6  MG 2.3 2.2  --   --   --   < > = values in this interval not displayed. No results found for this basename: AST, ALT, ALKPHOS, BILITOT, PROT, ALBUMIN,  in the last 72 hours No results found for this basename: LIPASE, AMYLASE,  in the last 72 hours  Recent Labs  07/09/13 0345 07/10/13 0458  WBC 11.8* 13.3*  HGB 9.4* 9.9*  HCT 27.1* 28.6*  MCV 92.2 91.4  PLT 126* 185   No results found for this basename: CKTOTAL, CKMB, TROPONINI,  in the last 72 hours No components found with this basename: POCBNP,  No results found for this basename: DDIMER,  in the last 72 hours No results found for this basename: HGBA1C,  in the last 72  hours No results found for this basename: CHOL, HDL, LDLCALC, TRIG, CHOLHDL,  in the last 72 hours No results found for this basename: TSH, T4TOTAL, FREET3, T3FREE, THYROIDAB,  in the last 72 hours No results found for this basename: VITAMINB12, FOLATE, FERRITIN, TIBC, IRON, RETICCTPCT,  in the last 72 hours  Medications: Scheduled . aspirin EC  81 mg Oral Daily  . atorvastatin  80 mg Oral q1800  . bisacodyl  10 mg Oral Daily   Or  . bisacodyl  10 mg Rectal Daily  . docusate sodium  200 mg Oral Daily  . enoxaparin (LOVENOX) injection  30 mg Subcutaneous QHS  . furosemide  20 mg Oral Daily  . insulin aspart  0-24 Units Subcutaneous TID WC  . insulin detemir  15 Units Subcutaneous Q1200  . metoprolol tartrate  12.5 mg Oral BID  . pantoprazole  40 mg Oral QAC breakfast  . potassium chloride  20 mEq Oral Daily  . Ticagrelor  90  mg Oral BID     Radiology/Studies:  Dg Chest 2 View  07/10/2013   *RADIOLOGY REPORT*  Clinical Data: Post CABG  CHEST - 2 VIEW  Comparison: Prior radiograph from 07/09/2013  Findings: Median sternotomy wires are intact.  Cardiac and mediastinal silhouettes are unchanged.  Overall the lung volumes are slightly improved as compared to the prior examination with improved aeration at the left lung base. Blunting of the left costophrenic angle may represent small pleural effusion and / or pleural reaction.  No pulmonary edema.  No new focal infiltrates.  No pneumothorax.  Osseous structures are unchanged.  IMPRESSION: 1.  Improved lung volumes with improved aeration of the left lung base. 2. Persistent small left pleural effusion and / or pleural reaction status post CABG.   Original Report Authenticated By: Rise Mu, M.D.   Dg Chest Port 1 View  07/09/2013   CLINICAL DATA:  Postop cardiac surgery  EXAM: PORTABLE CHEST - 1 VIEW  COMPARISON:  07/08/2013  FINDINGS: Small left pleural effusion with associated left lower lobe atelectasis. No pleural effusion or  pneumothorax.  Interval removal of left chest tube, mediastinal drain, and right IJ Swan-Ganz catheter.  Right IJ venous sheath.  Cardiomegaly. Postsurgical changes related to prior CABG.  IMPRESSION: Small left pleural effusion with associated left basilar atelectasis.  No pneumothorax   Electronically Signed   By: Charline Bills M.D.   On: 07/09/2013 07:39    INR: Will add last result for INR, ABG once components are confirmed Will add last 4 CBG results once components are confirmed  Assessment/Plan: S/P Procedure(s) (LRB): CORONARY ARTERY BYPASS GRAFTING (CABG) (N/A)  1 steady progress 2 increase lopressor to 25 bid 3 push rehab/pulm toilet 4 gentle diuresis, watch sodium 5 H/H stable, mild leukocytosis 6 sugars ok, d/c insulin 7 cont brilinta for prev stent  LOS: 3 days    Steve Hickman,Steve Hickman 9/14/201410:26 AM  I have seen and examined Steve Hickman and agree with the above assessment  and plan.  Delight Ovens MD Beeper [phone] Office [phone] 07/10/2013 11:45 AM

## 2013-07-10 NOTE — Progress Notes (Signed)
At 21:10 patient had ST 160's followed by AFIB rate 130 to 170's. The patient was not symptomatic, and was stable. Scheduled 12.5 mg of Lopressor given. At 22:30 he converted to NSR in 80's. Will continue monitoring.

## 2013-07-10 NOTE — Progress Notes (Signed)
Subjective: Breathing better today.  Objective: Vital signs in last 24 hours: Temp:  [97.7 F (36.5 C)-98.8 F (37.1 C)] 98.5 F (36.9 C) (09/14 0358) Pulse Rate:  [68-106] 86 (09/14 0358) Resp:  [8-26] 18 (09/14 0358) BP: (98-141)/(57-72) 98/58 mmHg (09/14 0358) SpO2:  [94 %-100 %] 96 % (09/14 0358) Weight:  [151 lb 14.4 oz (68.9 kg)] 151 lb 14.4 oz (68.9 kg) (09/14 0500)    Intake/Output from previous day: 09/13 0701 - 09/14 0700 In: 560 [P.O.:540; I.V.:20] Out: 1350 [Urine:1350] Intake/Output this shift:    Medications Current Facility-Administered Medications  Medication Dose Route Frequency Provider Last Rate Last Dose  . acetaminophen (TYLENOL) tablet 650 mg  650 mg Oral Q6H PRN Delight Ovens, MD      . aspirin EC tablet 81 mg  81 mg Oral Daily Loreli Slot, MD   81 mg at 07/09/13 1046  . atorvastatin (LIPITOR) tablet 80 mg  80 mg Oral q1800 Wayne E Gold, PA-C   80 mg at 07/09/13 1700  . bisacodyl (DULCOLAX) EC tablet 10 mg  10 mg Oral Daily Rowe Clack, PA-C   10 mg at 07/09/13 1047   Or  . bisacodyl (DULCOLAX) suppository 10 mg  10 mg Rectal Daily Wayne E Gold, PA-C      . docusate sodium (COLACE) capsule 200 mg  200 mg Oral Daily Delight Ovens, MD   200 mg at 07/09/13 1046  . enoxaparin (LOVENOX) injection 30 mg  30 mg Subcutaneous QHS Delight Ovens, MD   30 mg at 07/09/13 2119  . furosemide (LASIX) tablet 20 mg  20 mg Oral Daily Delight Ovens, MD   20 mg at 07/09/13 1212  . insulin aspart (novoLOG) injection 0-24 Units  0-24 Units Subcutaneous TID WC Delight Ovens, MD   2 Units at 07/10/13 [phone]  . insulin detemir (LEVEMIR) injection 15 Units  15 Units Subcutaneous Q1200 Loreli Slot, MD   15 Units at 07/09/13 1213  . metoprolol tartrate (LOPRESSOR) tablet 12.5 mg  12.5 mg Oral BID Delight Ovens, MD   12.5 mg at 07/09/13 2119  . ondansetron (ZOFRAN) tablet 4 mg  4 mg Oral Q6H PRN Delight Ovens, MD       Or  .  ondansetron [facility]) injection 4 mg  4 mg Intravenous Q6H PRN Delight Ovens, MD      . oxyCODONE (Oxy IR/ROXICODONE) immediate release tablet 5-10 mg  5-10 mg Oral Q3H PRN Delight Ovens, MD   5 mg at 07/09/13 1047  . pantoprazole (PROTONIX) EC tablet 40 mg  40 mg Oral QAC breakfast Delight Ovens, MD   40 mg at 07/10/13 0820  . potassium chloride SA (K-DUR,KLOR-CON) CR tablet 20 mEq  20 mEq Oral Daily Delight Ovens, MD   20 mEq at 07/09/13 1212  . Ticagrelor (BRILINTA) tablet 90 mg  90 mg Oral BID Loreli Slot, MD   90 mg at 07/09/13 2119  . traMADol (ULTRAM) tablet 50-100 mg  50-100 mg Oral Q4H PRN Delight Ovens, MD   100 mg at 07/09/13 2119    PE: General appearance: alert, cooperative and no distress Lungs: clear to auscultation bilaterally and Although decrease BS in the bases.  No rales or wheeze. Heart: regular rate and rhythm, 1/6 sys MM LSB Extremities: No LEE Pulses: radials 2+ and symmetric, 1+ DPs Skin: Warm and dry Neurologic: Grossly normal  Lab Results:   Recent  Labs  07/08/13 1700 07/08/13 1733 07/09/13 0345 07/10/13 0458  WBC 12.1*  --  11.8* 13.3*  HGB 9.3* 10.2* 9.4* 9.9*  HCT 26.8* 30.0* 27.1* 28.6*  PLT 135*  --  126* 185   BMET  Recent Labs  07/08/13 0342  07/08/13 1733 07/09/13 0345 07/10/13 0458  NA 133*  --  136 134* 133*  K 4.1  --  4.2 4.0 3.9  CL 102  --  99 100 97  CO2 22  --   --  25 23  GLUCOSE 123*  --  102* 123* 125*  BUN 6  --  8 12 16   CREATININE 0.70  < > 0.90 0.84 0.78  CALCIUM 7.7*  --   --  8.4 8.6  < > = values in this interval not displayed. 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  Net fluids:  -0.79L.  After IV lasix.  Now on PO 20mg  daily.  Paroxsymal sinus tach like when active.  ASA, lipitor, lopressor 12.5 bid, Brilinta.   LOS: 3 days    Royann Wildasin 07/10/2013 8:43 AM

## 2013-07-10 NOTE — Progress Notes (Signed)
Pt. Seen and examined. Agree with the NP/PA-C note as written.  Breathing much better today - may have been due to atelactasis and or some pulmonary edema. Reviewed echo this am, there is aortic sclerosis, but no stenosis, incoordinate septal motion, EF 55%. Filling pressures look normal. In all, looks pretty good. Still with labile blood pressures. No room to uptitrate medication at this time. May not need lasix long-term.  Chrystie Nose, MD, [facility] Attending Cardiologist [facility]

## 2013-07-10 NOTE — Progress Notes (Signed)
Nursing note  Patient ambulated in hallway with wife. Tolerated well will monitor patient. Vinita Prentiss, Randall An RN

## 2013-07-10 NOTE — Progress Notes (Signed)
Nursing note  Patient ambulated in hallway x1 300 feet. Gait steady. Back in room will continue to monitor patient. Kamarie Veno, Randall An RN

## 2013-07-10 NOTE — Progress Notes (Addendum)
Patient assist times one to get into chair. Tolerated well will continue to monitor patient. Reeves Dam, Randall An

## 2013-07-11 ENCOUNTER — Encounter (HOSPITAL_COMMUNITY): Payer: Self-pay | Admitting: Cardiology

## 2013-07-11 DIAGNOSIS — I480 Paroxysmal atrial fibrillation: Secondary | ICD-10-CM

## 2013-07-11 HISTORY — DX: Paroxysmal atrial fibrillation: I48.0

## 2013-07-11 LAB — GLUCOSE, CAPILLARY
Glucose-Capillary: 120 mg/dL — ABNORMAL HIGH (ref 70–99)
Glucose-Capillary: 133 mg/dL — ABNORMAL HIGH (ref 70–99)

## 2013-07-11 MED ORDER — AMIODARONE HCL IN DEXTROSE 360-4.14 MG/200ML-% IV SOLN
60.0000 mg/h | INTRAVENOUS | Status: DC
  Filled 2013-07-11: qty 200

## 2013-07-11 MED ORDER — AMIODARONE LOAD VIA INFUSION
150.0000 mg | Freq: Once | INTRAVENOUS | Status: AC
  Administered 2013-07-11: 150 mg via INTRAVENOUS
  Filled 2013-07-11: qty 83.34

## 2013-07-11 MED ORDER — AMIODARONE HCL 200 MG PO TABS
400.0000 mg | ORAL_TABLET | Freq: Two times a day (BID) | ORAL | Status: DC
  Administered 2013-07-11 – 2013-07-13 (×5): 400 mg via ORAL
  Filled 2013-07-11 (×6): qty 2

## 2013-07-11 MED ORDER — AMIODARONE HCL IN DEXTROSE 360-4.14 MG/200ML-% IV SOLN
30.0000 mg/h | INTRAVENOUS | Status: DC
  Filled 2013-07-11: qty 200

## 2013-07-11 MED ORDER — METOPROLOL TARTRATE 1 MG/ML IV SOLN
INTRAVENOUS | Status: AC
  Filled 2013-07-11: qty 5

## 2013-07-11 MED ORDER — METOPROLOL TARTRATE 1 MG/ML IV SOLN
2.5000 mg | Freq: Four times a day (QID) | INTRAVENOUS | Status: DC | PRN
  Administered 2013-07-11: 2.5 mg via INTRAVENOUS

## 2013-07-11 MED ORDER — METOPROLOL TARTRATE 25 MG PO TABS
25.0000 mg | ORAL_TABLET | Freq: Two times a day (BID) | ORAL | Status: DC
  Administered 2013-07-11 – 2013-07-13 (×3): 25 mg via ORAL
  Filled 2013-07-11 (×5): qty 1

## 2013-07-11 MED FILL — Electrolyte-R (PH 7.4) Solution: INTRAVENOUS | Qty: 3000 | Status: AC

## 2013-07-11 MED FILL — Sodium Chloride Irrigation Soln 0.9%: Qty: 1000 | Status: AC

## 2013-07-11 MED FILL — Sodium Bicarbonate IV Soln 8.4%: INTRAVENOUS | Qty: 50 | Status: AC

## 2013-07-11 MED FILL — Mannitol IV Soln 20%: INTRAVENOUS | Qty: 500 | Status: AC

## 2013-07-11 MED FILL — Lidocaine HCl IV Inj 20 MG/ML: INTRAVENOUS | Qty: 5 | Status: AC

## 2013-07-11 MED FILL — Heparin Sodium (Porcine) Inj 1000 Unit/ML: INTRAMUSCULAR | Qty: 10 | Status: AC

## 2013-07-11 NOTE — Progress Notes (Signed)
Nursing note  2.5mg  metoprolol given IV asordered bp 100/60 and heart rate 150-160 atrial fib on monitor,  And then 82 systolic after administration Steve Hickman [facility] made aware and orders received.  150 mg bolus given as ordered of amiodarone,    Will continue to continue to monitor patient closely. Ruta Capece, Randall An  RN

## 2013-07-11 NOTE — Progress Notes (Addendum)
Nursing note  Patient ambulating in hallway with wife approximately 300 feet. Back in room will continue to montitor. Lisabeth Mian, Randall An RN

## 2013-07-11 NOTE — Progress Notes (Signed)
UR Completed.  Steve Hickman [phone] 07/11/2013  

## 2013-07-11 NOTE — Progress Notes (Signed)
[address]       [place] [address]             [phone]      4 Days Post-Op  Procedure(s) (LRB): CORONARY ARTERY BYPASS GRAFTING (CABG) (N/A) Subjective: Having som SOB at times  Objective  Telemetry afib , tachy and brady, currently SR with BBB  Temp:  [98 F (36.7 C)-99.7 F (37.6 C)] 98 F (36.7 C) (09/15 0509) Pulse Rate:  [74-94] 74 (09/15 0509) Resp:  [18-20] 18 (09/15 0509) BP: (107-121)/(71-74) 118/74 mmHg (09/15 0509) SpO2:  [95 %-96 %] 95 % (09/15 0509) Weight:  [147 lb 7.8 oz (66.9 kg)] 147 lb 7.8 oz (66.9 kg) (09/15 0500)   Intake/Output Summary (Last 24 hours) at 07/11/13 0737 Last data filed at 07/11/13 0500  Gross per 24 hour  Intake      0 ml  Output    600 ml  Net   -600 ml       General appearance: alert, cooperative and no distress Heart: regular rate and rhythm Lungs: min dim in bases Abdomen: benign Extremities: no edema\ Wound: incisions healing well  Lab Results:  Recent Labs  07/08/13 1700  07/09/13 0345 07/10/13 0458  NA  --   < > 134* 133*  K  --   < > 4.0 3.9  CL  --   < > 100 97  CO2  --   --  25 23  GLUCOSE  --   < > 123* 125*  BUN  --   < > 12 16  CREATININE 0.86  < > 0.84 0.78  CALCIUM  --   --  8.4 8.6  MG 2.2  --   --   --   < > = values in this interval not displayed. No results found for this basename: AST, ALT, ALKPHOS, BILITOT, PROT, ALBUMIN,  in the last 72 hours No results found for this basename: LIPASE, AMYLASE,  in the last 72 hours  Recent Labs  07/09/13 0345 07/10/13 0458  WBC 11.8* 13.3*  HGB 9.4* 9.9*  HCT 27.1* 28.6*  MCV 92.2 91.4  PLT 126* 185   No results found for this basename: CKTOTAL, CKMB, TROPONINI,  in the last 72 hours No components found with this basename: POCBNP,  No results found for this basename: DDIMER,  in the last 72 hours No results found for this basename: HGBA1C,  in the last 72 hours No results found for this basename: CHOL, HDL, LDLCALC,  TRIG, CHOLHDL,  in the last 72 hours No results found for this basename: TSH, T4TOTAL, FREET3, T3FREE, THYROIDAB,  in the last 72 hours No results found for this basename: VITAMINB12, FOLATE, FERRITIN, TIBC, IRON, RETICCTPCT,  in the last 72 hours  Medications: Scheduled . aspirin EC  81 mg Oral Daily  . atorvastatin  80 mg Oral q1800  . bisacodyl  10 mg Oral Daily   Or  . bisacodyl  10 mg Rectal Daily  . docusate sodium  200 mg Oral Daily  . enoxaparin (LOVENOX) injection  40 mg Subcutaneous Q24H  . furosemide  20 mg Oral Daily  . insulin aspart  0-24 Units Subcutaneous TID WC  . metoprolol tartrate  12.5 mg Oral BID  . pantoprazole  40 mg Oral QAC breakfast  . potassium chloride  20 mEq Oral Daily  . Ticagrelor  90 mg Oral BID     Radiology/Studies:  Dg Chest 2 View  07/10/2013   *RADIOLOGY REPORT*  Clinical Data: Post CABG  CHEST - 2 VIEW  Comparison: Prior radiograph from 07/09/2013  Findings: Median sternotomy wires are intact.  Cardiac and mediastinal silhouettes are unchanged.  Overall the lung volumes are slightly improved as compared to the prior examination with improved aeration at the left lung base. Blunting of the left costophrenic angle may represent small pleural effusion and / or pleural reaction.  No pulmonary edema.  No new focal infiltrates.  No pneumothorax.  Osseous structures are unchanged.  IMPRESSION: 1.  Improved lung volumes with improved aeration of the left lung base. 2. Persistent small left pleural effusion and / or pleural reaction status post CABG.   Original Report Authenticated By: Rise Mu, M.D.    INR: Will add last result for INR, ABG once components are confirmed Will add last 4 CBG results once components are confirmed  Assessment/Plan: S/P Procedure(s) (LRB): CORONARY ARTERY BYPASS GRAFTING (CABG) (N/A)  1 Having intermittent afib, will start po amio but will need to be careful as gets in brady range at times. Keep beta blocker at  low dose 2 sugars controlled on SSI reasonably well, HgBa1c 7.1. Will need to consider oral agent. 3 cont pulm toilet/rehab     LOS: 4 days    Balbina Depace E 9/15/20147:37 AM

## 2013-07-11 NOTE — Progress Notes (Signed)
CARDIAC REHAB PHASE I   PRE:  Rate/Rhythm: 97 SR    BP: sitting 116/65    SaO2: 96-98 RA  MODE:  Ambulation: 690 ft   POST:  Rate/Rhythm: 107 ST    BP: sitting 127/73     SaO2: 98 RA  Tolerated well with steady gait independently. Some SOB noted. No rest. Return to room and pt immediately went to sink to clean up. Maintained SR [phone]   Elissa Lovett [employer] CES, ACSM 07/11/2013 8:40 AM

## 2013-07-11 NOTE — Progress Notes (Signed)
Nursing Note  Patient back in NSR rate 83 at 1320 pm. Will continue to monitor patient. Danyella Mcginty, Randall An RN

## 2013-07-11 NOTE — Progress Notes (Signed)
Nursing note  Patient sustaining in 160-180 atrial fib, bp 101/70 patient slightly short of breath Steve Hickman pac made aware and orders received will continue tom onitor patient. Marland KitchenReeves Dam, Randall An RN

## 2013-07-11 NOTE — Progress Notes (Signed)
Subjective: " I feel a little weak, tired" (coincided with Afib RVR this AM)  Objective: Vital signs in last 24 hours: Temp:  [98 F (36.7 C)-99.7 F (37.6 C)] 98 F (36.7 C) (09/15 0509) Pulse Rate:  [74-130] 130 (09/15 0928) Resp:  [18-20] 18 (09/15 0509) BP: (101-118)/(66-74) 101/66 mmHg (09/15 0928) SpO2:  [95 %-96 %] 95 % (09/15 0509) Weight:  [147 lb 7.8 oz (66.9 kg)] 147 lb 7.8 oz (66.9 kg) (09/15 0500) Weight change: -4 lb 6.6 oz (-2 kg) Last BM Date: 07/10/13 Intake/Output from previous day: -600 09/14 0701 - 09/15 0700 In: -  Out: 600 [Urine:600] Intake/Output this shift:   PE: General:Pleasant affect, NAD Skin:Warm and dry, brisk capillary refill HEENT:normocephalic, sclera clear, mucus membranes moist Heart: irreg irreg, without murmur, gallup, rub or click Lungs:clear without rales, rhonchi, or wheezes WGN:FAOZ, non tender, + BS, do not palpate liver spleen or masses Ext:no lower ext edema,  Neuro:alert and oriented, MAE, follows commands, + facial symmetry   Lab Results:  Recent Labs  07/09/13 0345 07/10/13 0458  WBC 11.8* 13.3*  HGB 9.4* 9.9*  HCT 27.1* 28.6*  PLT 126* 185   BMET  Recent Labs  07/09/13 0345 07/10/13 0458  NA 134* 133*  K 4.0 3.9  CL 100 97  CO2 25 23  GLUCOSE 123* 125*  BUN 12 16  CREATININE 0.84 0.78  CALCIUM 8.4 8.6   No results found for this basename: TROPONINI, CK, MB,  in the last 72 hours  Lab Results  Component Value Date   CHOL 138 05/30/2013   HDL 34* 05/30/2013   LDLCALC 81 05/30/2013   TRIG 116 05/30/2013   CHOLHDL 4.1 05/30/2013   Lab Results  Component Value Date   HGBA1C 7.1* 07/05/2013     Lab Results  Component Value Date   TSH 1.900 05/28/2013      Studies/Results: Dg Chest 2 View  07/10/2013   *RADIOLOGY REPORT*  Clinical Data: Post CABG  CHEST - 2 VIEW  Comparison: Prior radiograph from 07/09/2013  Findings: Median sternotomy wires are intact.  Cardiac and mediastinal silhouettes are  unchanged.  Overall the lung volumes are slightly improved as compared to the prior examination with improved aeration at the left lung base. Blunting of the left costophrenic angle may represent small pleural effusion and / or pleural reaction.  No pulmonary edema.  No new focal infiltrates.  No pneumothorax.  Osseous structures are unchanged.  IMPRESSION: 1.  Improved lung volumes with improved aeration of the left lung base. 2. Persistent small left pleural effusion and / or pleural reaction status post CABG.   Original Report Authenticated By: Rise Mu, M.D.    Medications: I have reviewed the patient's current medications. Scheduled Meds: . amiodarone  400 mg Oral BID  . aspirin EC  81 mg Oral Daily  . atorvastatin  80 mg Oral q1800  . bisacodyl  10 mg Oral Daily   Or  . bisacodyl  10 mg Rectal Daily  . docusate sodium  200 mg Oral Daily  . enoxaparin (LOVENOX) injection  40 mg Subcutaneous Q24H  . insulin aspart  0-24 Units Subcutaneous TID WC  . metoprolol tartrate  12.5 mg Oral BID  . pantoprazole  40 mg Oral QAC breakfast  . Ticagrelor  90 mg Oral BID   Continuous Infusions:  PRN Meds:.acetaminophen, ondansetron (ZOFRAN) IV, ondansetron, oxyCODONE, traMADol  Assessment/Plan: Principal Problem:   CAD s/p CABG x 2 (LIMA-LAD, SV-PDA)  07/07/13 (EF 60%)  Active Problems:   STEMI (ST elevation myocardial infarction)- CFX BMS, 05/28/2013   Diabetes mellitus   Dyslipidemia, goal LDL below 70  PLAN:  POST OP DAY 4, s/p CABG. + PAF on amiodarone 400 BID, lopressor 12.5 BID, on Brilinta  occ pauses, with some bradycardia Glucose 105- 194   LOS: 4 days   Time spent with pt. :15 minutes. [facility] R  Nurse Practitioner Certified Pager [phone] 07/11/2013, 10:19 AM  I have seen and evaluated the patient this PM along with Nada Boozer, NP. I agree with her findings, examination as well as impression recommendations.  Besides post-op Afib with RVR this AM, has been  doing well -- now POD4 s/p CABG  Agree with Amiodarone PO -- has converted to NSR (so Heart - RRR)  Likely Post-op Afib with no prior history -- agree with amiodarone U& watching rate. Do not need to think about long term AC. Still has PM episodes of dyspnea / orhtopnea  -- If this is related to orthopnea with volume shift.  Another potential etiolgy is Brilinta realted dyspnea. -- if the persists, would convert to Plavix for recent stent.  No active HF SSx.  MD Time with pt: 10 min  HARDING,DAVID W, M.D., M.S. [facility] [address] [place], [place]  [address]  [phone] Pager # [phone] 07/11/2013 2:06 PM

## 2013-07-12 DIAGNOSIS — I4891 Unspecified atrial fibrillation: Secondary | ICD-10-CM

## 2013-07-12 LAB — GLUCOSE, CAPILLARY: Glucose-Capillary: 125 mg/dL — ABNORMAL HIGH (ref 70–99)

## 2013-07-12 MED ORDER — INSULIN ASPART 100 UNIT/ML ~~LOC~~ SOLN
0.0000 [IU] | Freq: Three times a day (TID) | SUBCUTANEOUS | Status: DC
  Administered 2013-07-12 – 2013-07-13 (×3): 2 [IU] via SUBCUTANEOUS

## 2013-07-12 NOTE — Progress Notes (Addendum)
      [address]       [place] [address]             [phone]      5 Days Post-Op  Procedure(s) (LRB): CORONARY ARTERY BYPASS GRAFTING (CABG) (N/A) Subjective: Back in sinus rhythm , did get brady but currently in 70's  Objective  Telemetry sinus rhythm  Temp:  [97.7 F (36.5 C)-98.2 F (36.8 C)] 97.9 F (36.6 C) (09/16 0443) Pulse Rate:  [71-155] 75 (09/16 0443) Resp:  [18] 18 (09/16 0443) BP: (82-126)/(50-80) 99/68 mmHg (09/16 0443) SpO2:  [93 %-98 %] 98 % (09/16 0443) Weight:  [144 lb 12.8 oz (65.681 kg)] 144 lb 12.8 oz (65.681 kg) (09/16 0443)   Intake/Output Summary (Last 24 hours) at 07/12/13 0812 Last data filed at 07/11/13 1900  Gross per 24 hour  Intake    600 ml  Output    300 ml  Net    300 ml       General appearance: alert, cooperative and no distress Heart: regular rate and rhythm Lungs: mildly dim in left base Abdomen: benign Extremities: no edema Wound: incisions healing well  Lab Results:  Recent Labs  07/10/13 0458  NA 133*  K 3.9  CL 97  CO2 23  GLUCOSE 125*  BUN 16  CREATININE 0.78  CALCIUM 8.6   No results found for this basename: AST, ALT, ALKPHOS, BILITOT, PROT, ALBUMIN,  in the last 72 hours No results found for this basename: LIPASE, AMYLASE,  in the last 72 hours  Recent Labs  07/10/13 0458  WBC 13.3*  HGB 9.9*  HCT 28.6*  MCV 91.4  PLT 185   No results found for this basename: CKTOTAL, CKMB, TROPONINI,  in the last 72 hours No components found with this basename: POCBNP,  No results found for this basename: DDIMER,  in the last 72 hours No results found for this basename: HGBA1C,  in the last 72 hours No results found for this basename: CHOL, HDL, LDLCALC, TRIG, CHOLHDL,  in the last 72 hours No results found for this basename: TSH, T4TOTAL, FREET3, T3FREE, THYROIDAB,  in the last 72 hours No results found for this basename: VITAMINB12, FOLATE, FERRITIN, TIBC, IRON, RETICCTPCT,  in the last  72 hours  Medications: Scheduled . amiodarone  400 mg Oral BID  . aspirin EC  81 mg Oral Daily  . atorvastatin  80 mg Oral q1800  . bisacodyl  10 mg Oral Daily   Or  . bisacodyl  10 mg Rectal Daily  . docusate sodium  200 mg Oral Daily  . enoxaparin (LOVENOX) injection  40 mg Subcutaneous Q24H  . insulin aspart  0-24 Units Subcutaneous TID WC  . metoprolol tartrate  25 mg Oral BID  . pantoprazole  40 mg Oral QAC breakfast  . Ticagrelor  90 mg Oral BID     Radiology/Studies:  No results found.  INR: Will add last result for INR, ABG once components are confirmed Will add last 4 CBG results once components are confirmed  Assessment/Plan: S/P Procedure(s) (LRB): CORONARY ARTERY BYPASS GRAFTING (CABG) (N/A)  1 feels a little SOB, but overall looks to be progressing well 2 rhythm has stabilized, cont current rx for now 3 cont rehab, may be ready for d/c tomorrow     LOS: 5 days    Hickman,Steve E 9/16/20148:12 AM  Patient seen and examined, agree with above Just ambulated 900'! Home in AM if rhythm stable

## 2013-07-12 NOTE — Progress Notes (Signed)
CARDIAC REHAB PHASE I   PRE:  Rate/Rhythm: 74 SB    BP: sitting 99/54    SaO2: 98 RA  MODE:  Ambulation: 890 ft   POST:  Rate/Rhythm: 102 ST    BP: sitting 93/73     SaO2: 97 RA  Tolerated well. No c/o. Independent with HR stable. BP lower. Return to recliner, set up d/c video. [phone]   Harriet Masson CES, ACSM 07/12/2013 9:16 AM

## 2013-07-12 NOTE — Progress Notes (Signed)
DAILY PROGRESS NOTE  Subjective:  Episodes of a-fib with RVR yesterday, but now in sinus. Received IV amiodarone load and now on 400 mg po BID. Dyspnea complaints wax and wane - I don't see a clear correlation with Brillinta.  Objective:  Temp:  [97.7 F (36.5 C)-98.2 F (36.8 C)] 97.9 F (36.6 C) (09/16 0443) Pulse Rate:  [71-155] 75 (09/16 0443) Resp:  [18] 18 (09/16 0443) BP: (82-126)/(50-80) 99/68 mmHg (09/16 0443) SpO2:  [93 %-98 %] 98 % (09/16 0443) Weight:  [144 lb 12.8 oz (65.681 kg)] 144 lb 12.8 oz (65.681 kg) (09/16 0443) Weight change: -2 lb 11 oz (-1.219 kg)  Intake/Output from previous day: 09/15 0701 - 09/16 0700 In: 840 [P.O.:840] Out: 300 [Urine:300]  Intake/Output from this shift:    Medications: Current Facility-Administered Medications  Medication Dose Route Frequency Provider Last Rate Last Dose  . acetaminophen (TYLENOL) tablet 650 mg  650 mg Oral Q6H PRN Delight Ovens, MD   650 mg at 07/10/13 2132  . amiodarone (PACERONE) tablet 400 mg  400 mg Oral BID Rowe Clack, PA-C   400 mg at 07/11/13 2228  . aspirin EC tablet 81 mg  81 mg Oral Daily Loreli Slot, MD   81 mg at 07/11/13 0929  . atorvastatin (LIPITOR) tablet 80 mg  80 mg Oral q1800 Wayne E Gold, PA-C   80 mg at 07/11/13 1650  . bisacodyl (DULCOLAX) EC tablet 10 mg  10 mg Oral Daily Rowe Clack, PA-C   10 mg at 07/10/13 4098   Or  . bisacodyl (DULCOLAX) suppository 10 mg  10 mg Rectal Daily Wayne E Gold, PA-C      . docusate sodium (COLACE) capsule 200 mg  200 mg Oral Daily Delight Ovens, MD   200 mg at 07/10/13 0954  . enoxaparin (LOVENOX) injection 40 mg  40 mg Subcutaneous Q24H Loreli Slot, MD   40 mg at 07/11/13 2227  . insulin aspart (novoLOG) injection 0-24 Units  0-24 Units Subcutaneous TID WC Loreli Slot, MD   2 Units at 07/12/13 [phone]  . metoprolol (LOPRESSOR) injection 2.5 mg  2.5 mg Intravenous Q6H PRN Wayne E Gold, PA-C   2.5 mg at 07/11/13 1149  .  metoprolol tartrate (LOPRESSOR) tablet 25 mg  25 mg Oral BID Rowe Clack, PA-C   25 mg at 07/11/13 2228  . ondansetron (ZOFRAN) tablet 4 mg  4 mg Oral Q6H PRN Delight Ovens, MD       Or  . ondansetron [facility]) injection 4 mg  4 mg Intravenous Q6H PRN Delight Ovens, MD      . oxyCODONE (Oxy IR/ROXICODONE) immediate release tablet 5-10 mg  5-10 mg Oral Q3H PRN Delight Ovens, MD   5 mg at 07/09/13 1047  . pantoprazole (PROTONIX) EC tablet 40 mg  40 mg Oral QAC breakfast Delight Ovens, MD   40 mg at 07/12/13 [phone]  . Ticagrelor (BRILINTA) tablet 90 mg  90 mg Oral BID Loreli Slot, MD   90 mg at 07/11/13 2227  . traMADol (ULTRAM) tablet 50-100 mg  50-100 mg Oral Q4H PRN Delight Ovens, MD   100 mg at 07/11/13 2227    Physical Exam: General appearance: alert and no distress Neck: no adenopathy, no carotid bruit, no JVD, supple, symmetrical, trachea midline and thyroid not enlarged, symmetric, no tenderness/mass/nodules Lungs: clear to auscultation bilaterally Heart: regular rate and rhythm and systolic murmur: early systolic  2/6, crescendo at 2nd right intercostal space Abdomen: soft, non-tender; bowel sounds normal; no masses,  no organomegaly Extremities: extremities normal, atraumatic, no cyanosis or edema Pulses: 2+ and symmetric Skin: Skin color, texture, turgor normal. No rashes or lesions Neurologic: Grossly normal  Lab Results: Results for orders placed during the hospital encounter of 07/07/13 (from the past 48 hour(s))  GLUCOSE, CAPILLARY     Status: Abnormal   Collection Time    07/10/13 10:59 AM      Result Value Range   Glucose-Capillary 115 (*) 70 - 99 mg/dL  GLUCOSE, CAPILLARY     Status: Abnormal   Collection Time    07/10/13  4:09 PM      Result Value Range   Glucose-Capillary 194 (*) 70 - 99 mg/dL  GLUCOSE, CAPILLARY     Status: Abnormal   Collection Time    07/10/13  9:03 PM      Result Value Range   Glucose-Capillary 124 (*) 70 - 99 mg/dL    GLUCOSE, CAPILLARY     Status: Abnormal   Collection Time    07/11/13  6:27 AM      Result Value Range   Glucose-Capillary 120 (*) 70 - 99 mg/dL  GLUCOSE, CAPILLARY     Status: Abnormal   Collection Time    07/11/13 11:11 AM      Result Value Range   Glucose-Capillary 120 (*) 70 - 99 mg/dL   Comment 1 Notify RN    GLUCOSE, CAPILLARY     Status: Abnormal   Collection Time    07/11/13  4:40 PM      Result Value Range   Glucose-Capillary 133 (*) 70 - 99 mg/dL   Comment 1 Documented in Chart     Comment 2 Notify RN    GLUCOSE, CAPILLARY     Status: Abnormal   Collection Time    07/11/13  7:57 PM      Result Value Range   Glucose-Capillary 126 (*) 70 - 99 mg/dL   Comment 1 Documented in Chart     Comment 2 Notify RN    GLUCOSE, CAPILLARY     Status: Abnormal   Collection Time    07/12/13  6:34 AM      Result Value Range   Glucose-Capillary 125 (*) 70 - 99 mg/dL    Imaging: No results found.  Assessment:  1. Principal Problem: 2.   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 Active Problems: 4.   STEMI (ST elevation myocardial infarction)- CFX BMS, 05/28/2013 5.   Diabetes mellitus 6.   Dyslipidemia, goal LDL below 70 7.   Paroxysmal a-fib - post-op CABBG 8.   Plan:  1. Seems to be doing very well. Hopefully amiodarone will help calm down his a-fib. Otherwise, not hypoxic with exercise and only minimal dyspnea complaints. Would continue current medication. Probably home soon.  Time Spent Directly with Patient:  15 minutes  Length of Stay:  LOS: 5 days   Chrystie Nose, MD, [facility] Attending Cardiologist [facility]  Abrey Bradway C 07/12/2013, 8:29 AM

## 2013-07-12 NOTE — Discharge Instructions (Signed)
Endoscopic Saphenous Vein Harvesting Care After Refer to this sheet in the next few weeks. These instructions provide you with information on caring for yourself after your procedure. Your caregiver may also give you more specific instructions. Your treatment has been planned according to current medical practices, but problems sometimes occur. Call your caregiver if you have any problems or questions after your procedure. HOME CARE INSTRUCTIONS Medicine  Take whatever pain medicine your surgeon prescribes. Follow the directions carefully. Do not take over-the-counter pain medicine unless your surgeon says it is okay. Some pain medicine can cause bleeding problems for several weeks after surgery.  Follow your surgeon's instructions about driving. You will probably not be permitted to drive after heart surgery.  Take any medicines your surgeon prescribes. Any medicines you took before your heart surgery should be checked with your caregiver before you start taking them again. Wound care  Ask your surgeon how long you should keep wearing your elastic bandage or stocking.  Check the area around your surgical cuts (incisions) whenever your bandages (dressings) are changed. Look for any redness or swelling.  You will need to return to have the stitches (sutures) or staples taken out. Ask your surgeon when to do that.  Ask your surgeon when you can shower or bathe. Activity  Try to keep your legs raised when you are sitting.  Do any exercises your caregivers have given you. These may include deep breathing exercises, coughing, walking, or other exercises. SEEK MEDICAL CARE IF:  You have any questions about your medicines.  You have more leg pain, especially if your pain medicine stops working.  New or growing bruises develop on your leg.  Your leg swells, feels tight, or becomes red.  You have numbness in your leg. SEEK IMMEDIATE MEDICAL CARE IF:  Your pain gets much worse.  Blood  or fluid leaks from any of the incisions.  Your incisions become warm, swollen, or red.  You have chest pain.  You have trouble breathing.  You have a fever.  You have more pain near your leg incision. MAKE SURE YOU:  Understand these instructions.  Will watch your condition.  Will get help right away if you are not doing well or get worse. Document Released: 06/25/2011 Document Revised: 01/05/2012 Document Reviewed: 06/25/2011 [facility] Patient Information 2014 [place], [place]. Coronary Artery Bypass Grafting Care After Refer to this sheet in the next few weeks. These instructions provide you with information on caring for yourself after your procedure. Your caregiver may also give you more specific instructions. Your treatment has been planned according to current medical practices, but problems sometimes occur. Call your caregiver if you have any problems or questions after your procedure.  Recovery from open heart surgery will be different for everyone. Some people feel well after 3 or 4 weeks, while for others it takes longer. After heart surgery, it may be normal to:  Not have an appetite, feel nauseated by the smell of food, or only want to eat a small amount.  Be constipated because of changes in your diet, activity, and medicines. Eat foods high in fiber. Add fresh fruits and vegetables to your diet. Stool softeners may be helpful.  Feel sad or unhappy. You may be frustrated or cranky. You may have good days and bad days. Do not give up. Talk to your caregiver if you do not feel better.  Feel weakness and fatigue. You many need physical therapy or cardiac rehabilitation to get your strength back.  Develop an  irregular heartbeat called atrial fibrillation. Symptoms of atrial fibrillation are a fast, irregular heartbeat or feelings of fluttery heartbeats, shortness of breath, low blood pressure, and dizziness. If these symptoms develop, see your caregiver right  away. MEDICATION  Have a list of all the medicines you will be taking when you leave the hospital. For every medicine, know the following:  Name.  Exact dose.  Time of day to be taken.  How often it should be taken.  Why you are taking it.  Ask which medicines should or should not be taken together. If you take more than one heart medicine, ask if it is okay to take them together. Some heart medicines should not be taken at the same time because they may lower your blood pressure too much.  Narcotic pain medicine can cause constipation. Eat fresh fruits and vegetables. Add fiber to your diet. Stool softener medicine may help relieve constipation.  Keep a copy of your medicines with you at all times.  Do not add or stop taking any medicine until you check with your caregiver.  Medicines can have side effects. Call your caregiver who prescribed the medicine if you:  Start throwing up, have diarrhea, or have stomach pain.  Feel dizzy or lightheaded when you stand up.  Feel your heart is skipping beats or is beating too fast or too slow.  Develop a rash.  Notice unusual bruising or bleeding. HOME CARE INSTRUCTIONS  After heart surgery, it is important to learn how to take your pulse. Have your caregiver show you how to take your pulse.  Use your incentive spirometer. Ask your caregiver how long after surgery you need to use it. Care of your chest incision  Tell your caregiver right away if you notice clicking in your chest (sternum).  Support your chest with a pillow or your arms when you take deep breaths and cough.  Follow your caregiver's instructions about when you can bathe or swim.  Protect your incision from sunlight during the first year to keep the scar from getting dark.  Tell your caregiver if you notice:  Increased tenderness of your incision.  Increased redness or swelling around your incision.  Drainage or pus from your incision. Care of your leg  incision(s)  Avoid crossing your legs.  Avoid sitting for long periods of time. Change positions every half hour.  Elevate your leg(s) when you are sitting.  Check your leg(s) daily for swelling. Check the incisions for redness or drainage.  Wear your elastic stockings as told by your caregiver. Take them off at bedtime. Diet  Diet is very important to heart health.  Eat plenty of fresh fruits and vegetables. Meats should be lean cut. Avoid canned, processed, and fried foods.  Talk to a dietician. They can teach you how to make healthy food and drink choices. Weight  Weigh yourself every day. This is important because it helps to know if you are retaining fluid that may make your heart and lungs work harder.  Use the same scale each time.  Weigh yourself every morning at the same time. You should do this after you go to the bathroom, but before you eat breakfast.  Your weight will be more accurate if you do not wear any clothes.  Record your weight.  Tell your caregiver if you have gained 2 pounds or more overnight. Activity Stop any activity at once if you have chest pain, shortness of breath, irregular heartbeats, or dizziness. Get help right away if  you have any of these symptoms.  Bathing.  Avoid soaking in a bath or hot tub until your incisions are healed.  Rest. You need a balance of rest and activity.  Exercise. Exercise per your caregiver's advice. You may need physical therapy or cardiac rehabilitation to help strengthen your muscles and build your endurance.  Climbing stairs. Unless your caregiver tells you not to climb stairs, go up stairs slowly and rest if you tire. Do not pull yourself up by the handrail.  Driving a car. Follow your caregiver's advice on when you may drive. You may ride as a passenger at any time. When traveling for long periods of time in a car, get out of the car and walk around for a few minutes every 2 hours.  Lifting. Avoid lifting,  pushing, or pulling anything heavier than 10 pounds for 6 weeks after surgery or as told by your caregiver.  Returning to work. Check with your caregiver. People heal at different rates. Most people will be able to go back to work 6 to 12 weeks after surgery.  Sexual activity. You may resume sexual relations as told by your caregiver. SEEK MEDICAL CARE IF:  Any of your incisions are red, painful, or have any type of drainage coming from them.  You have an oral temperature above 102 F (38.9 C).  You have ankle or leg swelling.  You have pain in your legs.  You have weight gain of 2 or more pounds a day.  You feel dizzy or lightheaded when you stand up. SEEK IMMEDIATE MEDICAL CARE IF:  You have angina or chest pain that goes to your jaw or arms. Call your local emergency services right away.  You have shortness of breath at rest or with activity.  You have a fast or irregular heartbeat (arrhythmia).  There is a "clicking" in your sternum when you move.  You have numbness or weakness in your arms or legs. MAKE SURE YOU:  Understand these instructions.  Will watch your condition.  Will get help right away if you are not doing well or get worse. Document Released: 05/02/2005 Document Revised: 01/05/2012 Document Reviewed: 12/18/2010 [facility] Patient Information 2014 [place], [place]. Atrial Fibrillation Atrial fibrillation is an abnormal heartbeat (rhythm). It can cause your heart rate to be faster or slower than normal, and can cause clots of blood to form in your heart. These clots can cause other health problems. Atrial fibrillation may be caused by a heart attack, lung problem, or certain medicine. Sometimes the cause of atrial fibrillation is not found. HOME CARE  Take blood thinning medicine (anticoagulants) as told by your doctor. Your doctor will need to draw your blood to check lab values if you take blood thinners.  If you had a cardioversion, limit your activity as  told by your doctor.  Learn how to check your heartbeat (pulse) for an abnormal or irregular beat. Your doctor can show you how.  Ask your doctor if it is okay to exercise.  Only take medicine as told by your doctor. GET HELP RIGHT AWAY IF:   You have trouble breathing or feel dizzy.  You have puffy (swollen) feet or ankles.  You have blood in your pee (urine) or poop (bowel movement).  You feel your heart "skipping" beats.  You feel your heart "racing" or beating fast.  You have weakness in your arms or legs.  You have trouble talking, seeing, or thinking.  You have chest pain or pain in your arm  or jaw. MAKE SURE YOU:   Understand these instructions.  Will watch your condition.  Will get help right away if you are not doing well or get worse. Document Released: 07/22/2008 Document Revised: 01/05/2012 Document Reviewed: 01/31/2010 [facility] Patient Information 2014 [place], [place].

## 2013-07-12 NOTE — Care Management Note (Unsigned)
    Page 1 of 1   07/12/2013     4:42:53 PM   CARE MANAGEMENT NOTE 07/12/2013  Patient:  Steve Hickman, Steve Hickman   Account Number:  [id number]  Date Initiated:  07/12/2013  Documentation initiated by:  Ireta Pullman  Subjective/Objective Assessment:   PT S/P CABG X 2 ON 07/07/13.  PTA, PT INDEPENDENT, LIVES WITH SPOUSE.     Action/Plan:   WIFE TO PROVIDE CARE AT DC.  WILL FOLLOW FOR HOME NEEDS AS PT PROGRESSES.   Anticipated DC Date:  07/13/2013   Anticipated DC Plan:  HOME/SELF CARE      DC Planning Services  CM consult      Choice offered to / List presented to:             Status of service:  In process, will continue to follow Medicare Important Message given?   (If response is "NO", the following Medicare IM given date fields will be blank) Date Medicare IM given:   Date Additional Medicare IM given:    Discharge Disposition:    Per UR Regulation:  Reviewed for med. necessity/level of care/duration of stay  If discussed at Long Length of Stay Meetings, dates discussed:    Comments:

## 2013-07-12 NOTE — Discharge Summary (Signed)
[address]       [place] [address]             [phone]       Steve Hickman [birth year] 72 y.o. [id number]  07/07/2013   Loreli Slot, MD  CAD   HPI:  This is a 72 y.o. male with no significant past medical history referred to Charlett Lango M.D. for cardiothoracic surgical consultation. Recently he developed a strange sensation in his chest with arm discomfort. Additionally he felt dizzy and had a brief loss of consciousness presenting to the emergency department where he was found to have an inferior STEMI. He underwent cardiac catheterization where he was found to have severe three-vessel coronary artery disease and normal left ventricular function. He had thrombus in his circumflex and Dr. Allyson Sabal perform an angioplasty with bare-metal stent. He was started on Brilinta and plans were made for interval CABG to revascularize the LAD and right coronary arteries. He was readmitted this hospitalization for the procedure. Past Medical History   Diagnosis  Date   .  Medical history non-contributory    .  Diabetes mellitus  05/31/2013   .  Dyslipidemia, goal LDL below 70  05/31/2013    Past Surgical History   Procedure  Laterality  Date   .  No past surgeries      No family history on file.  History    Social History   .  Marital Status:  Single     Spouse Name:  N/A     Number of Children:  N/A   .  Years of Education:  N/A    Occupational History   .  Not on file.    Social History Main Topics   .  Smoking status:  Never Smoker   .  Smokeless tobacco:  Not on file   .  Alcohol Use:  0.6 oz/week     1 Shots of liquor per week   .  Drug Use:  Not on file   .  Sexual Activity:  Not on file    Other Topics  Concern   .  Not on file    Social History Narrative   .  No narrative on file    Current Outpatient Prescriptions   Medication  Sig  Dispense  Refill   .  acetaminophen (TYLENOL) 325 MG tablet  Take 2 tablets (650 mg total)  by mouth every 4 (four) hours as needed.     Marland Kitchen  aspirin EC 81 MG EC tablet  Take 1 tablet (81 mg total) by mouth daily.     Marland Kitchen  atorvastatin (LIPITOR) 80 MG tablet  Take 1 tablet (80 mg total) by mouth daily at 6 PM.  30 tablet  6   .  isosorbide mononitrate (IMDUR) 30 MG 24 hr tablet  Take 1 tablet (30 mg total) by mouth daily.  30 tablet  1   .  metoprolol tartrate (LOPRESSOR) 25 MG tablet  Take 0.5 tablets (12.5 mg total) by mouth 2 (two) times daily.  30 tablet  6   .  nitroGLYCERIN (NITROSTAT) 0.4 MG SL tablet  Place 1 tablet (0.4 mg total) under the tongue every 5 (five) minutes x 3 doses as needed for chest pain.  25 tablet  3   .  pantoprazole (PROTONIX) 40 MG tablet  Take 1 tablet (40 mg total) by mouth daily at 6 (six) AM.  30 tablet  2   .  Ticagrelor (BRILINTA) 90 MG TABS tablet  Take 1 tablet (90 mg total) by mouth 2 (two) times daily.  60 tablet  1      Hospital Course:  The patient was admitted to the hospital and taken to the operating room on 07/07/2013 and underwent Procedure(s):  DATE OF PROCEDURE: 07/07/2013  DATE OF DISCHARGE:  OPERATIVE REPORT  PREOPERATIVE DIAGNOSIS: Severe two-vessel coronary artery disease with  recent myocardial infarction.  POSTOPERATIVE DIAGNOSIS: Severe two-vessel coronary artery disease with  recent myocardial infarction.  PROCEDURE: Median sternotomy, extracorporeal circulation, coronary  artery bypass grafting x2 (left internal mammary artery to left anterior  descending, saphenous vein graft to posterior descending), endoscopic  vein harvest left thigh.  SURGEON: Salvatore Decent. Dorris Fetch, M.D.  ASSISTANT: Rowe Clack, PA-C  ANESTHESIA: General.  FINDINGS: Vein in right leg small, vein from left leg good quality,  mammary good quality. LAD and posterior descending both relatively small,  diffusely diseased vessels, but both did accept a 1.5 mm probe.  The patient was taken from the  operating room to the surgical intensive care unit in good  condition.   Postoperative hospital course:  Overall the patient has progressed nicely. He was weaned from the ventilator, neurologically intact. He remained hemodynamically stable and was transferred to the SICU. All routine lines, monitors and drainage devices were discontinued in the standard fashion. He did have postoperative atrial fibrillation but has been chemically cardioverted to sinus rhythm with amiodarone and beta blocker. He is tolerating gradually increasing activities using standard protocols. Oxygen was weaned without significant difficulty although he was occasionally short of breath. He had a mild acute blood loss anemia and value stabilized. Most recent hematocrit on 07/10/2013 was 28. Renal function was within normal limits. Blood sugars were adequately controlled. The patient did have some volume overload and would continue on diuretics short-term as an outpatient. The  Incisions were healing well without evidence of infection. On the date of discharge she was felt to be quite stable .    Recent Labs  07/10/13 0458  NA 133*  K 3.9  CL 97  CO2 23  GLUCOSE 125*  BUN 16  CALCIUM 8.6    Recent Labs  07/10/13 0458  WBC 13.3*  HGB 9.9*  HCT 28.6*  PLT 185   No results found for this basename: INR,  in the last 72 hours   Discharge Instructions:  The patient is discharged to home with extensive instructions on wound care and progressive ambulation.  They are instructed not to drive or perform any heavy lifting until returning to see the physician in his office.  Discharge Diagnosis:  CAD  Secondary Diagnosis: Patient Active Problem List   Diagnosis Date Noted  . Paroxysmal a-fib - post-op CABBG 07/11/2013  . Diabetes mellitus 05/31/2013  . Dyslipidemia, goal LDL below 70 05/31/2013  . STEMI (ST elevation myocardial infarction)- CFX BMS, 05/28/2013 05/29/2013  . 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 LBBB (left bundle branch block)  05/29/2013   Past Medical History  Diagnosis Date  . Medical history non-contributory   . Diabetes mellitus 05/31/2013  . Dyslipidemia, goal LDL below 70 05/31/2013  . Coronary artery disease     STEMI  . Shortness of breath     with exertion  . STEMI (ST elevation myocardial infarction)- CFX BMS, 05/28/2013 05/29/2013    STEMI   . CAD s/p CABG x 2 (LIMA-LAD, SV-PDA) 07/07/13 (EF 60%)  05/29/2013  Follow-up Information   Follow up with Loreli Slot, MD. (Process 11:30 AM to see surgeon. Please obtain a chest x-ray at [facility] 1 hour prior to the appointment. [facility] is located in the same office complex.)    Specialty:  Cardiothoracic Surgery   Contact information:   [address] [place] [address] [phone]       Follow up with Runell Gess, MD. (2 weeks-please contact the office to arrange this appointment.)    Specialty:  Cardiology   Contact information:   [address] [place] [address] [phone]       The patient has been discharged on:   Medication List    STOP taking these medications       isosorbide mononitrate 30 MG 24 hr tablet  Commonly known as:  IMDUR     nitroGLYCERIN 0.4 MG SL tablet  Commonly known as:  NITROSTAT      TAKE these medications       acetaminophen 325 MG tablet  Commonly known as:  TYLENOL  Take 650 mg by mouth every 4 (four) hours as needed for pain.     amiodarone 400 MG tablet  Commonly known as:  PACERONE  Take 1 tablet (400 mg total) by mouth 2 (two) times daily.     aspirin 81 MG EC tablet  Take 1 tablet (81 mg total) by mouth daily.     atorvastatin 80 MG tablet  Commonly known as:  LIPITOR  Take 1 tablet (80 mg total) by mouth daily at 6 PM.     furosemide 20 MG tablet  Commonly known as:  LASIX  Take 1 tablet (20 mg total) by mouth daily. For 7 days     metoprolol tartrate 25 MG tablet  Commonly known as:  LOPRESSOR  Take 1 tablet (25 mg  total) by mouth 2 (two) times daily.     oxyCODONE 5 MG immediate release tablet  Commonly known as:  Oxy IR/ROXICODONE  Take 1-2 tablets (5-10 mg total) by mouth every 3 (three) hours as needed.     pantoprazole 40 MG tablet  Commonly known as:  PROTONIX  Take 1 tablet (40 mg total) by mouth daily at 6 (six) AM.     potassium chloride 10 MEQ tablet  Commonly known as:  K-DUR  Take 1 tablet (10 mEq total) by mouth 2 (two) times daily. For 7 days        1.Beta Blocker:  Yes [   y]                              No   [   ]                              If No, reason:  2.Ace Inhibitor/ARB: Yes [   ]                                     No  [  n  ]                                     If No, reason:BP low at times to 90's systolic and normal  LVF  3.Statin:   Yes [ y  ]                  No  [   ]                  If No, reason:  4.Ecasa:  Yes  [ y  ]                  No   [   ]                  If No, reason:  Condition on discharge: good   Disposition: discharged home  Steve Hickman, [place] 07/12/2013  9:56 AM

## 2013-07-12 NOTE — Progress Notes (Signed)
07/12/2013 1230 Nursing note Received call from Central Telemetry stating pt. Has been going in and out of AFib for short time frames then back to NSR all morning. Pt. Currently NSR 78.  Pt. Rate has been controlled consistently less than 100. Pt. Gershon Crane [facility] on floor and made aware. No new orders at this time. Will continue to monitor patient.  Jehan Bonano, [place]ard Kelch

## 2013-07-13 ENCOUNTER — Other Ambulatory Visit: Payer: Self-pay | Admitting: Cardiovascular Disease

## 2013-07-13 DIAGNOSIS — Z951 Presence of aortocoronary bypass graft: Secondary | ICD-10-CM

## 2013-07-13 DIAGNOSIS — E119 Type 2 diabetes mellitus without complications: Secondary | ICD-10-CM

## 2013-07-13 LAB — GLUCOSE, CAPILLARY: Glucose-Capillary: 123 mg/dL — ABNORMAL HIGH (ref 70–99)

## 2013-07-13 MED ORDER — AMIODARONE HCL 400 MG PO TABS: 400.0000 mg | ORAL_TABLET | Freq: Two times a day (BID) | ORAL | Status: DC

## 2013-07-13 MED ORDER — POTASSIUM CHLORIDE ER 10 MEQ PO TBCR: 10.0000 meq | EXTENDED_RELEASE_TABLET | Freq: Two times a day (BID) | ORAL | Status: DC

## 2013-07-13 MED ORDER — OXYCODONE HCL 5 MG PO TABS: 5.0000 mg | ORAL_TABLET | ORAL | Status: DC | PRN

## 2013-07-13 MED ORDER — METOPROLOL TARTRATE 25 MG PO TABS: 25.0000 mg | ORAL_TABLET | Freq: Two times a day (BID) | ORAL | Status: DC

## 2013-07-13 MED ORDER — FUROSEMIDE 20 MG PO TABS: 20.0000 mg | ORAL_TABLET | Freq: Every day | ORAL | Status: DC

## 2013-07-13 NOTE — Progress Notes (Signed)
Pt. Seen and examined. Agree with the NP/PA-C note as written. No complaints. Breathing has improved. Maintaining sinus on amiodarone. Would recommend continuing BID load for 1 week, then decreasing to 400 mg daily. D/c home today planned per TCTS. Follow-up with Dr. Allyson Sabal.  Chrystie Nose, MD, [facility] Attending Cardiologist [facility]

## 2013-07-13 NOTE — Telephone Encounter (Signed)
Rx was sent to pharmacy electronically. 

## 2013-07-13 NOTE — Progress Notes (Signed)
[id number] Education completed with pt who voice understanding. Discussed CRP 2 and permission given to refer to [place]. Encouraged IS and diabetic diet. Gave heart healthy and diabetic diets. Pt had gotten diet ed on last admission. Reviewed highlights of diet. Has seen post op video. Luetta Nutting RNBSN

## 2013-07-13 NOTE — Progress Notes (Signed)
Subjective: Still having a breathing "thing"   Objective: Vital signs in last 24 hours: Temp:  [98 F (36.7 C)] 98 F (36.7 C) (09/17 0418) Pulse Rate:  [66-81] 66 (09/17 0418) Resp:  [18] 18 (09/17 0418) BP: (107-122)/(55-77) 118/77 mmHg (09/17 0418) SpO2:  [94 %-98 %] 98 % (09/17 0418) Weight:  [144 lb 6.4 oz (65.5 kg)] 144 lb 6.4 oz (65.5 kg) (09/17 0421) Last BM Date: 07/10/13  Intake/Output from previous day: 09/16 0701 - 09/17 0700 In: 600 [P.O.:600] Out: -  Intake/Output this shift:    Medications Current Facility-Administered Medications  Medication Dose Route Frequency Provider Last Rate Last Dose  . acetaminophen (TYLENOL) tablet 650 mg  650 mg Oral Q6H PRN Delight Ovens, MD   650 mg at 07/10/13 2132  . amiodarone (PACERONE) tablet 400 mg  400 mg Oral BID Rowe Clack, PA-C   400 mg at 07/12/13 2132  . aspirin EC tablet 81 mg  81 mg Oral Daily Loreli Slot, MD   81 mg at 07/12/13 1017  . atorvastatin (LIPITOR) tablet 80 mg  80 mg Oral q1800 Wayne E Gold, PA-C   80 mg at 07/12/13 1655  . bisacodyl (DULCOLAX) EC tablet 10 mg  10 mg Oral Daily Rowe Clack, PA-C   10 mg at 07/10/13 1610   Or  . bisacodyl (DULCOLAX) suppository 10 mg  10 mg Rectal Daily Wayne E Gold, PA-C      . docusate sodium (COLACE) capsule 200 mg  200 mg Oral Daily Delight Ovens, MD   200 mg at 07/10/13 0954  . enoxaparin (LOVENOX) injection 40 mg  40 mg Subcutaneous Q24H Loreli Slot, MD   40 mg at 07/12/13 2132  . insulin aspart (novoLOG) injection 0-24 Units  0-24 Units Subcutaneous TID WC Loreli Slot, MD   2 Units at 07/13/13 0740  . metoprolol (LOPRESSOR) injection 2.5 mg  2.5 mg Intravenous Q6H PRN Wayne E Gold, PA-C   2.5 mg at 07/11/13 1149  . metoprolol tartrate (LOPRESSOR) tablet 25 mg  25 mg Oral BID Rowe Clack, PA-C   25 mg at 07/12/13 2132  . ondansetron (ZOFRAN) tablet 4 mg  4 mg Oral Q6H PRN Delight Ovens, MD       Or  . ondansetron [facility])  injection 4 mg  4 mg Intravenous Q6H PRN Delight Ovens, MD      . oxyCODONE (Oxy IR/ROXICODONE) immediate release tablet 5-10 mg  5-10 mg Oral Q3H PRN Delight Ovens, MD   5 mg at 07/09/13 1047  . pantoprazole (PROTONIX) EC tablet 40 mg  40 mg Oral QAC breakfast Delight Ovens, MD   40 mg at 07/13/13 0739  . Ticagrelor (BRILINTA) tablet 90 mg  90 mg Oral BID Loreli Slot, MD   90 mg at 07/12/13 2133  . traMADol (ULTRAM) tablet 50-100 mg  50-100 mg Oral Q4H PRN Delight Ovens, MD   100 mg at 07/11/13 2227    PE: General appearance: alert, cooperative and no distress Lungs: clear to auscultation bilaterally Heart: regular rate and rhythm, S1, S2 normal, no murmur, click, rub or gallop Extremities: No LEE Pulses: 2+ and symmetric Skin: Warm and dry Neurologic: Grossly normal   Assessment/Plan  Principal Problem:   CAD s/p CABG x 2 (LIMA-LAD, SV-PDA) 07/07/13 (EF 60%)  Active Problems:   STEMI (ST elevation myocardial infarction)- CFX BMS, 05/28/2013   Diabetes mellitus   Dyslipidemia,  goal LDL below 70   Paroxysmal a-fib - post-op CABBG  Plan:  Ready to go home.  Still having intermittent episodes of Afib otherwise stable.  DC planned for today.  AMio 400mg BID, ASA, lopressor 25mg , Brilinta.  Decresae AMio in a week to 400 daily.   LOS: 6 days    Steve Hickman 07/13/2013 10:16 AM

## 2013-07-13 NOTE — Progress Notes (Signed)
6 Days Post-Op Procedure(s) (LRB): CORONARY ARTERY BYPASS GRAFTING (CABG) (N/A) Subjective: Still c/o feeling SOB while laying flat  Objective: Vital signs in last 24 hours: Temp:  [98 F (36.7 C)] 98 F (36.7 C) (09/17 0418) Pulse Rate:  [66-81] 66 (09/17 0418) Cardiac Rhythm:  [-] Normal sinus rhythm (09/17 0800) Resp:  [18] 18 (09/17 0418) BP: (93-122)/(55-77) 118/77 mmHg (09/17 0418) SpO2:  [94 %-98 %] 98 % (09/17 0418) Weight:  [144 lb 6.4 oz (65.5 kg)] 144 lb 6.4 oz (65.5 kg) (09/17 0421)  Hemodynamic parameters for last 24 hours:    Intake/Output from previous day: 09/16 0701 - 09/17 0700 In: 600 [P.O.:600] Out: -  Intake/Output this shift:    General appearance: alert and no distress Neurologic: intact Heart: regular rate and rhythm Lungs: diminished breath sounds bibasilar Wound: clean and dry  Lab Results: No results found for this basename: WBC, HGB, HCT, PLT,  in the last 72 hours BMET: No results found for this basename: NA, K, CL, CO2, GLUCOSE, BUN, CREATININE, CALCIUM,  in the last 72 hours  PT/INR: No results found for this basename: LABPROT, INR,  in the last 72 hours ABG    Component Value Date/Time   PHART 7.336* 07/07/2013 1821   HCO3 24.4* 07/08/2013 1709   TCO2 24 07/08/2013 1733   ACIDBASEDEF 5.0* 07/07/2013 1821   O2SAT 68.0 07/08/2013 1709   CBG (last 3)   Recent Labs  07/12/13 1619 07/12/13 2045 07/13/13 0601  GLUCAP 123* 136* 123*    Assessment/Plan: S/P Procedure(s) (LRB): CORONARY ARTERY BYPASS GRAFTING (CABG) (N/A) - CV- stable. Maintaining SR  RESP- I reassured him that it is typical to feel SOB when lying supine early after CABG. Most patients are more comfortable in a recliner-like position with the head elevated  CBG well controlled  DC home today   LOS: 6 days    HENDRICKSON,STEVEN C 07/13/2013

## 2013-07-25 ENCOUNTER — Encounter: Payer: Self-pay | Admitting: Cardiology

## 2013-07-25 ENCOUNTER — Ambulatory Visit (INDEPENDENT_AMBULATORY_CARE_PROVIDER_SITE_OTHER): Payer: Medicare Other | Admitting: Cardiology

## 2013-07-25 VITALS — BP 106/62 | HR 57 | Ht 67.0 in | Wt 139.7 lb

## 2013-07-25 DIAGNOSIS — R0602 Shortness of breath: Secondary | ICD-10-CM | POA: Diagnosis not present

## 2013-07-25 DIAGNOSIS — I213 ST elevation (STEMI) myocardial infarction of unspecified site: Secondary | ICD-10-CM

## 2013-07-25 DIAGNOSIS — I219 Acute myocardial infarction, unspecified: Secondary | ICD-10-CM

## 2013-07-25 DIAGNOSIS — I2510 Atherosclerotic heart disease of native coronary artery without angina pectoris: Secondary | ICD-10-CM

## 2013-07-25 DIAGNOSIS — I447 Left bundle-branch block, unspecified: Secondary | ICD-10-CM

## 2013-07-25 DIAGNOSIS — I480 Paroxysmal atrial fibrillation: Secondary | ICD-10-CM

## 2013-07-25 DIAGNOSIS — I4891 Unspecified atrial fibrillation: Secondary | ICD-10-CM

## 2013-07-25 MED ORDER — METOPROLOL TARTRATE 25 MG PO TABS: 12.5000 mg | ORAL_TABLET | Freq: Two times a day (BID) | ORAL | Status: DC

## 2013-07-25 MED ORDER — AMIODARONE HCL 200 MG PO TABS: 200.0000 mg | ORAL_TABLET | Freq: Every day | ORAL | Status: DC

## 2013-07-25 NOTE — Assessment & Plan Note (Signed)
Holding NSR on Amiodarone 

## 2013-07-25 NOTE — Progress Notes (Signed)
07/25/2013 Steve Hickman   April 19, 1941  [id number]  Primary Physicia No PCP Per Patient Primary Cardiologist: Dr Allyson Sabal  HPI:  72yo WM who had not seen any physician in years  presented to the ER with an inferior STEMI 05/28/13.  He was seen and evaluated and taken emergently to the cath lab. He underwent PCI with BMS to proximal codominant LCX. He had residual disease and underwent elective CABG X 2 with LIMA-LAD, SVG-PD on 07/07/13 by Dr Dorris Fetch. Post op he had PAF, he is in NSR/SB on Amiodarone. Since discharge 07/12/13 he had some SOB that is improving. He still feels weak. He denies any tachycardia.    Current Outpatient Prescriptions  Medication Sig Dispense Refill  . acetaminophen (TYLENOL) 325 MG tablet Take 650 mg by mouth every 4 (four) hours as needed for pain.      Marland Kitchen aspirin EC 81 MG EC tablet Take 1 tablet (81 mg total) by mouth daily.      Marland Kitchen atorvastatin (LIPITOR) 80 MG tablet Take 1 tablet (80 mg total) by mouth daily at 6 PM.  30 tablet  6  . BRILINTA 90 MG TABS tablet TAKE ONE TABLET BY MOUTH TWICE DAILY  60 tablet  6  . furosemide (LASIX) 20 MG tablet Take 1 tablet (20 mg total) by mouth daily. For 7 days  7 tablet  0  . oxyCODONE (OXY IR/ROXICODONE) 5 MG immediate release tablet Take 1-2 tablets (5-10 mg total) by mouth every 3 (three) hours as needed.  30 tablet  0  . pantoprazole (PROTONIX) 40 MG tablet Take 1 tablet (40 mg total) by mouth daily at 6 (six) AM.  30 tablet  2  . potassium chloride (K-DUR) 10 MEQ tablet Take 1 tablet (10 mEq total) by mouth 2 (two) times daily. For 7 days  14 tablet  0   No current facility-administered medications for this visit.    No Known Allergies  History   Social History  . Marital Status: Married    Spouse Name: N/A    Number of Children: N/A  . Years of Education: N/A   Occupational History  . Not on file.   Social History Main Topics  . Smoking status: Never Smoker   . Smokeless tobacco: Never Used  . Alcohol Use:  0.6 oz/week    1 Shots of liquor per week  . Drug Use: No  . Sexual Activity: Not on file   Other Topics Concern  . Not on file   Social History Narrative  . No narrative on file     Review of Systems: General: negative for chills, fever, night sweats or weight changes.  Cardiovascular: negative for chest pain, dyspnea on exertion, edema, orthopnea, palpitations, paroxysmal nocturnal dyspnea or shortness of breath Dermatological: negative for rash Respiratory: negative for cough or wheezing Urologic: negative for hematuria Abdominal: negative for nausea, vomiting, diarrhea, bright red blood per rectum, melena, or hematemesis Neurologic: negative for visual changes, syncope, or dizziness All other systems reviewed and are otherwise negative except as noted above.    Blood pressure 106/62, pulse 57, height 5\' 7"  (1.702 m), weight 139 lb 11.2 oz (63.368 kg).  General appearance: alert, cooperative, no distress and pale Lungs: clear to auscultation bilaterally Heart: regular rate and rhythm Extremities: no edema  EKG NSR/SB  ASSESSMENT AND PLAN:   STEMI (ST elevation myocardial infarction)- CFX BMS, 05/28/2013 .  CAD s/p CABG x 2 (LIMA-LAD, SV-PDA) 07/07/13 (EF 60%)  Seen today post op  Paroxysmal a-fib - post-op CABBG Holding NSR- on Amiodarone  LBBB (left bundle branch block) .   PLAN  Decrease Amiodarone to 200 mg daily, decrease Lopressor to 12.5 mg BID. Some of his dyspnea may be from Brilinta, discussed with Dr Allyson Sabal who would like to continue this for 3 months total. F/U Dr Allyson Sabal 6 weeks.  Rebeca Valdivia KPA-C 07/25/2013 2:28 PM

## 2013-07-25 NOTE — Patient Instructions (Addendum)
Decrease Amiodarone to 200 mg daily. Decrease Lopressor to 12.5 mg twice a day. Follow up with Dr Allyson Sabal in 6 weeks

## 2013-07-25 NOTE — Assessment & Plan Note (Signed)
Seen today post op

## 2013-08-01 ENCOUNTER — Other Ambulatory Visit: Payer: Self-pay | Admitting: *Deleted

## 2013-08-01 DIAGNOSIS — I2510 Atherosclerotic heart disease of native coronary artery without angina pectoris: Secondary | ICD-10-CM

## 2013-08-02 ENCOUNTER — Ambulatory Visit (INDEPENDENT_AMBULATORY_CARE_PROVIDER_SITE_OTHER): Payer: Self-pay | Admitting: Thoracic Surgery (Cardiothoracic Vascular Surgery)

## 2013-08-02 ENCOUNTER — Encounter: Payer: Self-pay | Admitting: Thoracic Surgery (Cardiothoracic Vascular Surgery)

## 2013-08-02 ENCOUNTER — Ambulatory Visit
Admission: RE | Admit: 2013-08-02 | Discharge: 2013-08-02 | Disposition: A | Discharge status: Home / Self Care | Payer: Medicare Other | Source: Ambulatory Visit | Attending: Thoracic Surgery (Cardiothoracic Vascular Surgery) | Admitting: Thoracic Surgery (Cardiothoracic Vascular Surgery)

## 2013-08-02 VITALS — BP 108/74 | HR 63 | Resp 16 | Ht 67.0 in | Wt 139.0 lb

## 2013-08-02 DIAGNOSIS — I2510 Atherosclerotic heart disease of native coronary artery without angina pectoris: Secondary | ICD-10-CM

## 2013-08-02 DIAGNOSIS — J90 Pleural effusion, not elsewhere classified: Secondary | ICD-10-CM | POA: Diagnosis not present

## 2013-08-02 DIAGNOSIS — Z951 Presence of aortocoronary bypass graft: Secondary | ICD-10-CM

## 2013-08-02 NOTE — Progress Notes (Signed)
HPI:  Steve Hickman is a 72 year old gentleman who returns for a scheduled postoperative followup visit.  He had a STEMI on August 2. He was taken emergently to the cath lab bare-metal stent placed in his circumflex. After an interval for recovery we did coronary bypass grafting x2 with a left mammary to his LAD and a saphenous vein to posterior descending on 07/07/2013. Postoperatively he had atrial fibrillation which converted to sinus rhythm on amiodarone. Other than that he did quite well.  He says that he is not having any pain. He does get short of breath with exertion although he is having some signs of improvement over the past several days. His appetite is fair. He does tire easily.  Past Medical History  Diagnosis Date  . Medical history non-contributory   . Diabetes mellitus 05/31/2013  . Dyslipidemia, goal LDL below 70 05/31/2013  . Coronary artery disease     STEMI  . Shortness of breath     with exertion  . STEMI (ST elevation myocardial infarction)- CFX BMS, 05/28/2013 05/29/2013    STEMI   . CAD s/p CABG x 2 (LIMA-LAD, SV-PDA) 07/07/13 (EF 60%)  05/29/2013      Current Outpatient Prescriptions  Medication Sig Dispense Refill  . acetaminophen (TYLENOL) 325 MG tablet Take 650 mg by mouth every 4 (four) hours as needed for pain.      Marland Kitchen amiodarone (PACERONE) 200 MG tablet Take 1 tablet (200 mg total) by mouth daily.      Marland Kitchen aspirin EC 81 MG EC tablet Take 1 tablet (81 mg total) by mouth daily.      Marland Kitchen atorvastatin (LIPITOR) 80 MG tablet Take 1 tablet (80 mg total) by mouth daily at 6 PM.  30 tablet  6  . BRILINTA 90 MG TABS tablet TAKE ONE TABLET BY MOUTH TWICE DAILY  60 tablet  6  . metoprolol tartrate (LOPRESSOR) 25 MG tablet Take 0.5 tablets (12.5 mg total) by mouth 2 (two) times daily.      . pantoprazole (PROTONIX) 40 MG tablet Take 1 tablet (40 mg total) by mouth daily at 6 (six) AM.  30 tablet  2   No current facility-administered medications for this visit.    Physical  Exam BP 108/74  Pulse 63  Resp 16  Ht 5\' 7"  (1.702 m)  Wt 139 lb (63.05 kg)  BMI 21.77 kg/m2  SpO12 81% 72 year old male in no acute distress General well-developed and well-nourished Neurologic alert and oriented x3 with no deficits Lungs clear with equal breath sounds bilaterally Cardiac regular rate and rhythm normal S1 and S2 Sternum stable, incisions well healed Leg incision healing well, no peripheral edema  Diagnostic Tests: Chest x-ray 08/02/2013 CLINICAL DATA: CABG in in September of 2014, some chest pain and  mild shortness of breath  EXAM:  CHEST 2 VIEW  COMPARISON: Chest x-ray of 07/10/2013  FINDINGS:  The right pleural effusion has resolved. Only a small left pleural  effusion remains. Aeration of the lungs has improved. Cardiomegaly  is stable. Median sternotomy sutures are intact.  IMPRESSION:  1. Resolution of small right pleural effusion.  2. Small left pleural effusion remains. Improved aeration.  Electronically Signed  By: Dwyane Dee M.D.  On: 08/02/2013 10:31  Impression: Steve Hickman is a 72 year old gentleman who had a ST elevation MI back in August treated emergently with a bare-metal stent in the circumflex. He then underwent an interval coronary bypass grafting x2 on 07/07/2013. He has done well postoperatively. He is  having minimal discomfort. He does tire easily and get short of breath with exertion, I reassured him that that should improve over time.  His amiodarone dose was decreased to 200 mg daily. He remains on Brilinta for his stent.  At this point I think he is safe to begin driving a limited basis. Appropriate precautions were discussed. He is to avoid high speeds and long trips for the next 3-4 weeks. He should not lifting over 10 pounds for another 2 weeks. Beyond that his activities are unrestricted.   Plan: He'll continue to be followed by Dr. Nanetta Batty for his cardiology needs.  I'll be happy to see him back any time if I can be of  any further assistance with his care.

## 2013-09-05 ENCOUNTER — Encounter: Payer: Self-pay | Admitting: Cardiovascular Disease

## 2013-09-05 ENCOUNTER — Ambulatory Visit (INDEPENDENT_AMBULATORY_CARE_PROVIDER_SITE_OTHER): Payer: Medicare Other | Admitting: Cardiovascular Disease

## 2013-09-05 VITALS — BP 136/70 | HR 67 | Ht 65.0 in | Wt 142.2 lb

## 2013-09-05 DIAGNOSIS — I2510 Atherosclerotic heart disease of native coronary artery without angina pectoris: Secondary | ICD-10-CM

## 2013-09-05 DIAGNOSIS — I480 Paroxysmal atrial fibrillation: Secondary | ICD-10-CM

## 2013-09-05 DIAGNOSIS — Z23 Encounter for immunization: Secondary | ICD-10-CM | POA: Diagnosis not present

## 2013-09-05 DIAGNOSIS — I213 ST elevation (STEMI) myocardial infarction of unspecified site: Secondary | ICD-10-CM

## 2013-09-05 DIAGNOSIS — E785 Hyperlipidemia, unspecified: Secondary | ICD-10-CM | POA: Diagnosis not present

## 2013-09-05 DIAGNOSIS — I4891 Unspecified atrial fibrillation: Secondary | ICD-10-CM | POA: Diagnosis not present

## 2013-09-05 DIAGNOSIS — I447 Left bundle-branch block, unspecified: Secondary | ICD-10-CM

## 2013-09-05 DIAGNOSIS — I219 Acute myocardial infarction, unspecified: Secondary | ICD-10-CM | POA: Diagnosis not present

## 2013-09-05 NOTE — Assessment & Plan Note (Signed)
On statin therapy with lipid profile performed 05/30/13 revealed a total cholesterol of 138, LDL of 81 and HDL of 34

## 2013-09-05 NOTE — Progress Notes (Signed)
09/05/2013 Steve Hickman   17-Jan-1941  [id number]  Primary Physician No PCP Per Patient Primary Cardiologist: Steve Gess MD Roseanne Reno   HPI:  72yo WM who had not seen any physician in years presented to the ER with an inferior STEMI 05/28/13. He was seen and evaluated and taken emergently to the cath lab. He underwent PCI with BMS to proximal codominant LCX. He had residual disease and underwent elective CABG X 2 with LIMA-LAD, SVG-PD on 07/07/13 by Dr Dorris Fetch. Post op he had PAF, he is in NSR/SB on Amiodarone. Since discharge 07/12/13 he had some SOB that is improving. He still feels weak. He denies any tachycardia.He did not participate in cardiac rehabilitation.he has been stable since he saw [facility] back in the office 07/25/13.    Current Outpatient Prescriptions  Medication Sig Dispense Refill  . acetaminophen (TYLENOL) 325 MG tablet Take 650 mg by mouth every 4 (four) hours as needed for pain.      Marland Kitchen aspirin EC 81 MG EC tablet Take 1 tablet (81 mg total) by mouth daily.      Marland Kitchen atorvastatin (LIPITOR) 80 MG tablet Take 1 tablet (80 mg total) by mouth daily at 6 PM.  30 tablet  6  . metoprolol tartrate (LOPRESSOR) 25 MG tablet Take 0.5 tablets (12.5 mg total) by mouth 2 (two) times daily.      . pantoprazole (PROTONIX) 40 MG tablet Take 1 tablet (40 mg total) by mouth daily at 6 (six) AM.  30 tablet  2   No current facility-administered medications for this visit.    No Known Allergies  History   Social History  . Marital Status: Married    Spouse Name: N/A    Number of Children: N/A  . Years of Education: N/A   Occupational History  . Not on file.   Social History Main Topics  . Smoking status: Never Smoker   . Smokeless tobacco: Never Used  . Alcohol Use: 0.6 oz/week    1 Shots of liquor per week  . Drug Use: No  . Sexual Activity: Not on file   Other Topics Concern  . Not on file   Social History Narrative  . No narrative on  file     Review of Systems: General: negative for chills, fever, night sweats or weight changes.  Cardiovascular: negative for chest pain, dyspnea on exertion, edema, orthopnea, palpitations, paroxysmal nocturnal dyspnea or shortness of breath Dermatological: negative for rash Respiratory: negative for cough or wheezing Urologic: negative for hematuria Abdominal: negative for nausea, vomiting, diarrhea, bright red blood per rectum, melena, or hematemesis Neurologic: negative for visual changes, syncope, or dizziness All other systems reviewed and are otherwise negative except as noted above.    Blood pressure 136/70, pulse 67, height 5\' 5"  (1.651 m), weight 142 lb 3.2 oz (64.501 kg).  General appearance: alert and no distress Neck: no adenopathy, no carotid bruit, no JVD, supple, symmetrical, trachea midline and thyroid not enlarged, symmetric, no tenderness/mass/nodules Lungs: clear to auscultation bilaterally Heart: regular rate and rhythm, S1, S2 normal, no murmur, click, rub or gallop Extremities: extremities normal, atraumatic, no cyanosis or edema  EKG normal sinus rhythm at 67 with left bundle branch block  ASSESSMENT AND PLAN:   CAD s/p CABG x 2 (LIMA-LAD, SV-PDA) 07/07/13 (EF 60%)  Status post inferolateral STEMI 05/28/13 she was aspiration thrombectomy, PCI and stenting using a bare-metal stent. He had staged coronary artery bypass grafting x2 by  Dr. Dorris Fetch with a LIMA to the LAD and a vein to the PDA 07/07/13. He did have some postop A. Fib now maintaining sinus rhythm. He he did not participate in cardiac rehabilitation. He denies chest pain or shortness of breath.  Dyslipidemia, goal LDL below 70 On statin therapy with lipid profile performed 05/30/13 revealed a total cholesterol of 138, LDL of 81 and HDL of 34  Paroxysmal a-fib - post-op CABBG Immediate perioperative period. On amiodarone maintaining sinus rhythm. I'm going to stop his amiodarone today. He is sinus  rhythm with left bundle-branch block.      Steve Gess MD FACP,FACC,FAHA, [facility] 09/05/2013 2:40 PM

## 2013-09-05 NOTE — Assessment & Plan Note (Signed)
Status post inferolateral STEMI 05/28/13 she was aspiration thrombectomy, PCI and stenting using a bare-metal stent. He had staged coronary artery bypass grafting x2 by Dr. Dorris Fetch with a LIMA to the LAD and a vein to the PDA 07/07/13. He did have some postop A. Fib now maintaining sinus rhythm. He he did not participate in cardiac rehabilitation. He denies chest pain or shortness of breath.

## 2013-09-05 NOTE — Assessment & Plan Note (Signed)
Immediate perioperative period. On amiodarone maintaining sinus rhythm. I'm going to stop his amiodarone today. He is sinus rhythm with left bundle-branch block.

## 2013-09-05 NOTE — Patient Instructions (Signed)
  We will see you back in follow up in 6 months with [facility] and 1 year with Dr Allyson Sabal.  Dr Allyson Sabal has ordered for you to take a low dose Aspirin (81mg ) daily.    You can stop the Amiodarone and the Brilinta.

## 2013-09-06 ENCOUNTER — Encounter: Payer: Self-pay | Admitting: Cardiovascular Disease

## 2013-09-06 ENCOUNTER — Other Ambulatory Visit: Payer: Self-pay | Admitting: Cardiovascular Disease

## 2013-09-06 NOTE — Telephone Encounter (Signed)
Rx was sent to pharmacy electronically. 

## 2013-11-02 ENCOUNTER — Telehealth: Payer: Self-pay | Admitting: Cardiovascular Disease

## 2013-11-02 NOTE — Telephone Encounter (Signed)
Forward to [place]

## 2013-11-02 NOTE — Telephone Encounter (Signed)
Need a note stating that he can go back to work on 11-17-13. Please fax to [phone] [id number] [employer].

## 2013-11-03 NOTE — Telephone Encounter (Signed)
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  I will defer to Dr Gwenlyn Found

## 2013-11-07 ENCOUNTER — Encounter: Payer: Self-pay | Admitting: *Deleted

## 2013-11-08 NOTE — Telephone Encounter (Signed)
Dr Gwenlyn Found reviewed the chart and authorized a return to work date of 11/17/13.  Note faxed to Nigel Berthold at patient's request.

## 2014-01-25 ENCOUNTER — Other Ambulatory Visit: Payer: Self-pay | Admitting: Physician Assistant

## 2014-01-26 ENCOUNTER — Other Ambulatory Visit: Payer: Self-pay

## 2014-01-26 MED ORDER — METOPROLOL TARTRATE 25 MG PO TABS: 12.5000 mg | ORAL_TABLET | Freq: Two times a day (BID) | ORAL | Status: DC

## 2014-01-26 NOTE — Telephone Encounter (Signed)
Rx was sent to pharmacy electronically. 

## 2014-03-14 ENCOUNTER — Other Ambulatory Visit: Payer: Self-pay | Admitting: *Deleted

## 2014-03-14 MED ORDER — ATORVASTATIN CALCIUM 80 MG PO TABS: 80.0000 mg | ORAL_TABLET | Freq: Every day | ORAL | Status: DC

## 2014-03-14 NOTE — Telephone Encounter (Signed)
Rx was sent to pharmacy electronically. 

## 2014-05-28 ENCOUNTER — Other Ambulatory Visit: Payer: Self-pay | Admitting: Cardiovascular Disease

## 2014-05-29 NOTE — Telephone Encounter (Signed)
Rx refill sent to patient pharmacy   

## 2014-06-04 ENCOUNTER — Other Ambulatory Visit: Payer: Self-pay | Admitting: Cardiovascular Disease

## 2014-06-05 NOTE — Telephone Encounter (Signed)
Rx refill sent to patient pharmacy   

## 2014-07-04 DIAGNOSIS — H2589 Other age-related cataract: Secondary | ICD-10-CM | POA: Diagnosis not present

## 2014-07-25 DIAGNOSIS — I252 Old myocardial infarction: Secondary | ICD-10-CM | POA: Diagnosis not present

## 2014-07-25 DIAGNOSIS — H259 Unspecified age-related cataract: Secondary | ICD-10-CM | POA: Diagnosis not present

## 2014-07-25 DIAGNOSIS — Z7982 Long term (current) use of aspirin: Secondary | ICD-10-CM | POA: Diagnosis not present

## 2014-07-25 DIAGNOSIS — Z79899 Other long term (current) drug therapy: Secondary | ICD-10-CM | POA: Diagnosis not present

## 2014-07-25 DIAGNOSIS — H2589 Other age-related cataract: Secondary | ICD-10-CM | POA: Diagnosis not present

## 2014-07-25 DIAGNOSIS — E119 Type 2 diabetes mellitus without complications: Secondary | ICD-10-CM | POA: Diagnosis not present

## 2014-07-25 DIAGNOSIS — I10 Essential (primary) hypertension: Secondary | ICD-10-CM | POA: Diagnosis not present

## 2014-07-25 DIAGNOSIS — K219 Gastro-esophageal reflux disease without esophagitis: Secondary | ICD-10-CM | POA: Diagnosis not present

## 2014-07-25 DIAGNOSIS — Z9861 Coronary angioplasty status: Secondary | ICD-10-CM | POA: Diagnosis not present

## 2014-07-25 DIAGNOSIS — I2510 Atherosclerotic heart disease of native coronary artery without angina pectoris: Secondary | ICD-10-CM | POA: Diagnosis not present

## 2014-07-25 DIAGNOSIS — E785 Hyperlipidemia, unspecified: Secondary | ICD-10-CM | POA: Diagnosis not present

## 2014-08-09 DIAGNOSIS — Z23 Encounter for immunization: Secondary | ICD-10-CM | POA: Diagnosis not present

## 2014-10-05 ENCOUNTER — Encounter (HOSPITAL_COMMUNITY): Payer: Self-pay | Admitting: Cardiovascular Disease

## 2014-10-31 ENCOUNTER — Other Ambulatory Visit: Payer: Self-pay | Admitting: *Deleted

## 2014-10-31 MED ORDER — ATORVASTATIN CALCIUM 80 MG PO TABS: 80.0000 mg | ORAL_TABLET | Freq: Every day | ORAL | Status: DC

## 2014-11-06 ENCOUNTER — Other Ambulatory Visit: Payer: Self-pay | Admitting: *Deleted

## 2014-11-06 ENCOUNTER — Telehealth: Payer: Self-pay | Admitting: Cardiovascular Disease

## 2014-11-06 MED ORDER — ATORVASTATIN CALCIUM 80 MG PO TABS: 80.0000 mg | ORAL_TABLET | Freq: Every day | ORAL | Status: DC

## 2014-11-06 NOTE — Telephone Encounter (Signed)
VM not set up, patient needs appointment for additional refills.

## 2014-11-06 NOTE — Telephone Encounter (Signed)
Pt still have not received his generic Lipitor. Would you please today to [facility] in [place].

## 2014-11-07 NOTE — Telephone Encounter (Signed)
Patient notified refill was sent in 11/06/14 OV made for 12/01/14 with Dr. Gwenlyn Found

## 2014-12-01 ENCOUNTER — Encounter: Payer: Self-pay | Admitting: Cardiovascular Disease

## 2014-12-01 ENCOUNTER — Other Ambulatory Visit: Payer: Self-pay

## 2014-12-01 ENCOUNTER — Ambulatory Visit (INDEPENDENT_AMBULATORY_CARE_PROVIDER_SITE_OTHER): Payer: Medicare Other | Admitting: Cardiovascular Disease

## 2014-12-01 VITALS — BP 120/64 | HR 65 | Ht 65.0 in | Wt 158.3 lb

## 2014-12-01 DIAGNOSIS — I2510 Atherosclerotic heart disease of native coronary artery without angina pectoris: Secondary | ICD-10-CM | POA: Diagnosis not present

## 2014-12-01 DIAGNOSIS — E785 Hyperlipidemia, unspecified: Secondary | ICD-10-CM | POA: Diagnosis not present

## 2014-12-01 DIAGNOSIS — I447 Left bundle-branch block, unspecified: Secondary | ICD-10-CM | POA: Diagnosis not present

## 2014-12-01 DIAGNOSIS — I2101 ST elevation (STEMI) myocardial infarction involving left main coronary artery: Secondary | ICD-10-CM | POA: Diagnosis not present

## 2014-12-01 MED ORDER — PANTOPRAZOLE SODIUM 40 MG PO TBEC: DELAYED_RELEASE_TABLET | ORAL | Status: DC

## 2014-12-01 NOTE — Assessment & Plan Note (Signed)
History of CAD status post inferior STEMI 05/28/13. He was taken emergently to the Cath Lab and underwent PCI with bare metal stent to the proximal codominant circumflex. He had residual disease and underwent elective coronary artery bypass grafting with a LIMA to his LAD and a vein to the PDA 07/07/13 by Dr. Roxan Hockey. He did have some postop A. Fib converting to sinus rhythm on amiodarone. He denies chest pain or shortness of breath.

## 2014-12-01 NOTE — Assessment & Plan Note (Signed)
History of hyperlipidemia on atorvastatin 80 mg a day. We will recheck a lipid and liver profile

## 2014-12-01 NOTE — Telephone Encounter (Signed)
rx sent to pharmacy

## 2014-12-01 NOTE — Assessment & Plan Note (Signed)
chronic

## 2014-12-01 NOTE — Progress Notes (Signed)
12/01/2014 Steve Hickman   09-06-1941  [id number]  Primary Physician No PCP Per Patient Primary Cardiologist: Lorretta Harp MD Renae Gloss   HPI:  74yo WM who had not seen any physician in years presented to the ER with an inferior STEMI 05/28/13. He was seen and evaluated and taken emergently to the cath lab. He underwent PCI with BMS to proximal codominant LCX. He had residual disease and underwent elective CABG X 2 with LIMA-LAD, SVG-PD on 07/07/13 by Dr Roxan Hockey. Post op he had PAF, he is in NSR/SBand was on amiodarone briefly, during to normal sinus rhythm. Since I saw him a year ago he denies chest pain or shortness of breath.   Current Outpatient Prescriptions  Medication Sig Dispense Refill  . acetaminophen (TYLENOL) 325 MG tablet Take 650 mg by mouth every 4 (four) hours as needed for pain.    Marland Kitchen aspirin EC 81 MG EC tablet Take 1 tablet (81 mg total) by mouth daily.    Marland Kitchen atorvastatin (LIPITOR) 80 MG tablet Take 1 tablet (80 mg total) by mouth daily at 6 PM. 30 tablet 2  . metoprolol tartrate (LOPRESSOR) 25 MG tablet TAKE ONE-HALF TABLET BY MOUTH TWICE DAILY 30 tablet 5  . pantoprazole (PROTONIX) 40 MG tablet TAKE ONE TABLET BY MOUTH AT 6 IN THE MORNING 30 tablet 5   No current facility-administered medications for this visit.    No Known Allergies  History   Social History  . Marital Status: Married    Spouse Name: N/A    Number of Children: N/A  . Years of Education: N/A   Occupational History  . Not on file.   Social History Main Topics  . Smoking status: Never Smoker   . Smokeless tobacco: Never Used  . Alcohol Use: 0.6 oz/week    1 Shots of liquor per week  . Drug Use: No  . Sexual Activity: Not on file   Other Topics Concern  . Not on file   Social History Narrative     Review of Systems: General: negative for chills, fever, night sweats or weight changes.  Cardiovascular: negative for chest pain, dyspnea on exertion, edema,  orthopnea, palpitations, paroxysmal nocturnal dyspnea or shortness of breath Dermatological: negative for rash Respiratory: negative for cough or wheezing Urologic: negative for hematuria Abdominal: negative for nausea, vomiting, diarrhea, bright red blood per rectum, melena, or hematemesis Neurologic: negative for visual changes, syncope, or dizziness All other systems reviewed and are otherwise negative except as noted above.    Blood pressure 120/64, pulse 65, height 5\' 5"  (1.651 m), weight 158 lb 4.8 oz (71.804 kg).  General appearance: alert and no distress Neck: no adenopathy, no carotid bruit, no JVD, supple, symmetrical, trachea midline and thyroid not enlarged, symmetric, no tenderness/mass/nodules Lungs: clear to auscultation bilaterally Heart: regular rate and rhythm, S1, S2 normal, no murmur, click, rub or gallop Extremities: extremities normal, atraumatic, no cyanosis or edema  EKG Normal sinus rhythm at 65 follow-up of the branch block unchanged from prior EKGs.. I personally reviewed this EKG  ASSESSMENT AND PLAN:   STEMI (ST elevation myocardial infarction)- CFX BMS, 05/28/2013 History of CAD status post inferior STEMI 05/28/13. He was taken emergently to the Cath Lab and underwent PCI with bare metal stent to the proximal codominant circumflex. He had residual disease and underwent elective coronary artery bypass grafting with a LIMA to his LAD and a vein to the PDA 07/07/13 by Dr. Roxan Hockey. He did have some  postop A. Fib converting to sinus rhythm on amiodarone. He denies chest pain or shortness of breath.   LBBB (left bundle branch block) chronic   Dyslipidemia, goal LDL below 70 History of hyperlipidemia on atorvastatin 80 mg a day. We will recheck a lipid and liver profile       Lorretta Harp MD [facility], [facility] 12/01/2014 4:22 PM

## 2014-12-01 NOTE — Patient Instructions (Signed)
Dr. Gwenlyn Found has ordered for you to have lab work done in the next week, and you need to be FASTING.  Your physician wants you to follow-up in 1 year with Dr. Gwenlyn Found. You will receive a reminder letter in the mail 2 months in advance. If you do not receive a letter, please call our office to schedule the follow-up appointment.

## 2014-12-07 DIAGNOSIS — E785 Hyperlipidemia, unspecified: Secondary | ICD-10-CM | POA: Diagnosis not present

## 2014-12-07 DIAGNOSIS — R3915 Urgency of urination: Secondary | ICD-10-CM | POA: Diagnosis not present

## 2014-12-07 DIAGNOSIS — R5383 Other fatigue: Secondary | ICD-10-CM | POA: Diagnosis not present

## 2014-12-07 DIAGNOSIS — Z79899 Other long term (current) drug therapy: Secondary | ICD-10-CM | POA: Diagnosis not present

## 2014-12-08 LAB — CBC WITH DIFFERENTIAL/PLATELET
Basophils Absolute: 0 10*3/uL (ref 0.0–0.1)
Basophils Relative: 0 % (ref 0–1)
Eosinophils Absolute: 0.2 10*3/uL (ref 0.0–0.7)
Eosinophils Relative: 2 % (ref 0–5)
HEMATOCRIT: 43.1 % (ref 39.0–52.0)
Hemoglobin: 14.2 g/dL (ref 13.0–17.0)
LYMPHS ABS: 3 10*3/uL (ref 0.7–4.0)
LYMPHS PCT: 34 % (ref 12–46)
MCH: 30.3 pg (ref 26.0–34.0)
MCHC: 32.9 g/dL (ref 30.0–36.0)
MCV: 91.9 fL (ref 78.0–100.0)
MONOS PCT: 7 % (ref 3–12)
MPV: 11.2 fL (ref 8.6–12.4)
Monocytes Absolute: 0.6 10*3/uL (ref 0.1–1.0)
NEUTROS ABS: 5 10*3/uL (ref 1.7–7.7)
NEUTROS PCT: 57 % (ref 43–77)
Platelets: 267 10*3/uL (ref 150–400)
RBC: 4.69 MIL/uL (ref 4.22–5.81)
RDW: 13.1 % (ref 11.5–15.5)
WBC: 8.8 10*3/uL (ref 4.0–10.5)

## 2014-12-08 LAB — LIPID PANEL
Cholesterol: 82 mg/dL (ref 0–200)
HDL: 30 mg/dL — ABNORMAL LOW (ref >39)
LDL CALC: 37 mg/dL (ref 0–99)
Total CHOL/HDL Ratio: 2.7 Ratio
Triglycerides: 73 mg/dL (ref <150)
VLDL: 15 mg/dL (ref 0–40)

## 2014-12-08 LAB — COMPREHENSIVE METABOLIC PANEL
ALT: 29 U/L (ref 0–53)
AST: 32 U/L (ref 0–37)
Albumin: 3.8 g/dL (ref 3.5–5.2)
Alkaline Phosphatase: 103 U/L (ref 39–117)
BUN: 10 mg/dL (ref 6–23)
CO2: 24 mEq/L (ref 19–32)
Calcium: 9 mg/dL (ref 8.4–10.5)
Chloride: 103 mEq/L (ref 96–112)
Creat: 0.83 mg/dL (ref 0.50–1.35)
Glucose, Bld: 161 mg/dL — ABNORMAL HIGH (ref 70–99)
Potassium: 4.4 mEq/L (ref 3.5–5.3)
Sodium: 139 mEq/L (ref 135–145)
Total Bilirubin: 0.8 mg/dL (ref 0.2–1.2)
Total Protein: 6.8 g/dL (ref 6.0–8.3)

## 2014-12-08 LAB — PSA, TOTAL AND FREE
PSA FREE PCT: 43 % (ref >25)
PSA FREE: 0.42 ng/mL
PSA: 0.98 ng/mL (ref <=4.00)

## 2014-12-08 LAB — TSH: TSH: 2.231 u[IU]/mL (ref 0.350–4.500)

## 2014-12-11 ENCOUNTER — Other Ambulatory Visit: Payer: Self-pay | Admitting: *Deleted

## 2014-12-11 MED ORDER — METOPROLOL TARTRATE 25 MG PO TABS: 12.5000 mg | ORAL_TABLET | Freq: Two times a day (BID) | ORAL | Status: DC

## 2014-12-12 ENCOUNTER — Encounter: Payer: Self-pay | Admitting: *Deleted

## 2015-02-14 ENCOUNTER — Other Ambulatory Visit: Payer: Self-pay | Admitting: *Deleted

## 2015-02-14 MED ORDER — ATORVASTATIN CALCIUM 80 MG PO TABS: 80.0000 mg | ORAL_TABLET | Freq: Every day | ORAL | Status: DC

## 2015-02-14 NOTE — Telephone Encounter (Signed)
Rx(s) sent to pharmacy electronically.  

## 2015-06-02 ENCOUNTER — Other Ambulatory Visit: Payer: Self-pay | Admitting: Cardiovascular Disease

## 2015-06-04 NOTE — Telephone Encounter (Signed)
Rx(s) sent to pharmacy electronically.  

## 2015-06-13 ENCOUNTER — Other Ambulatory Visit: Payer: Self-pay | Admitting: Cardiovascular Disease

## 2015-06-13 NOTE — Telephone Encounter (Signed)
Rx(s) sent to pharmacy electronically.  

## 2015-07-08 IMAGING — CR DG CHEST 1V PORT
1 series · 1 of 1 positions shown · non-contrast
Comparison: 07/08/2013

CLINICAL DATA: Postop cardiac surgery

EXAM:
PORTABLE CHEST - 1 VIEW

[AP]
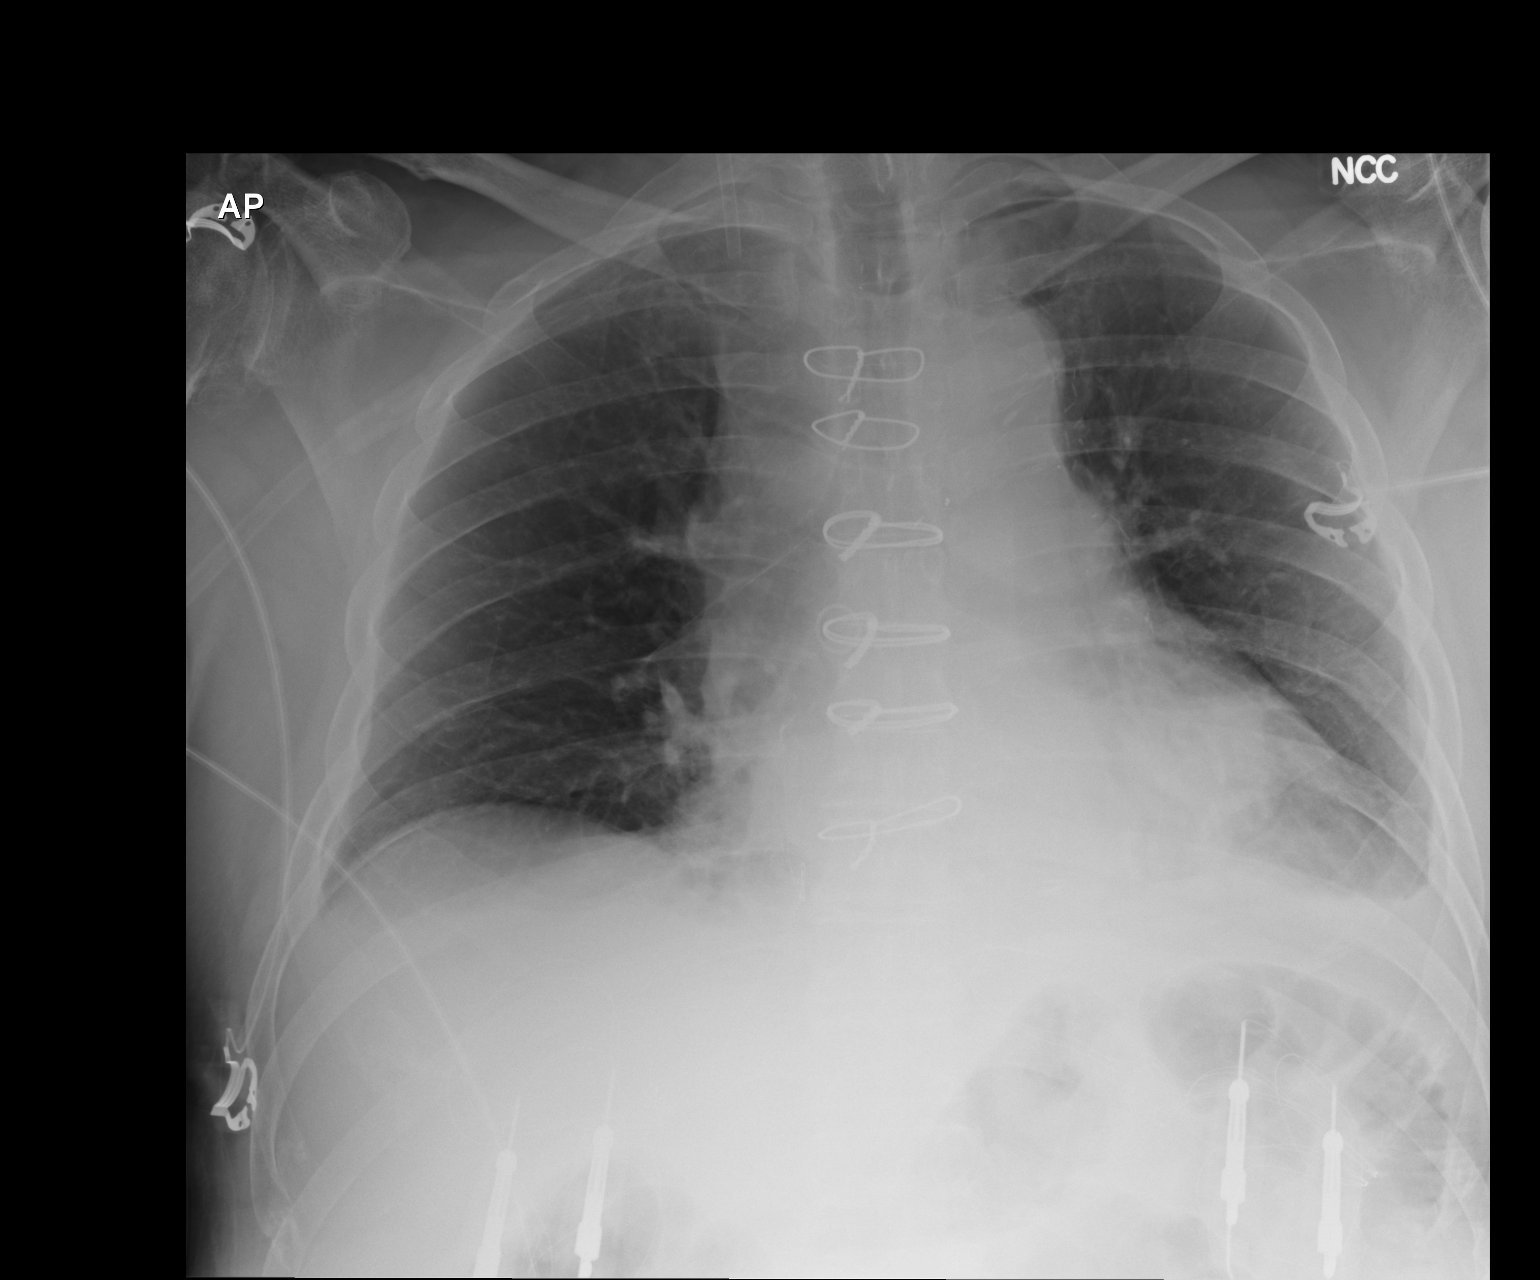

[1 of 1 positions shown; findings below may reference images not displayed]

FINDINGS: Small left pleural effusion with associated left lower lobe
atelectasis. No pleural effusion or pneumothorax.

Interval removal of left chest tube, mediastinal drain, and right IJ
Swan-Ganz catheter.

Right IJ venous sheath.

Cardiomegaly. Postsurgical changes related to prior CABG.
IMPRESSION: Small left pleural effusion with associated left basilar
atelectasis.

No pneumothorax

## 2015-08-01 IMAGING — CR DG CHEST 2V
3 series · 3 of 3 positions shown · non-contrast
Comparison: Chest x-ray of 07/10/2013

CLINICAL DATA: CABG in in Thursday June, 2013, some chest pain and
mild shortness of breath

EXAM:
CHEST  2 VIEW

[w chest pa (1 of 2)]
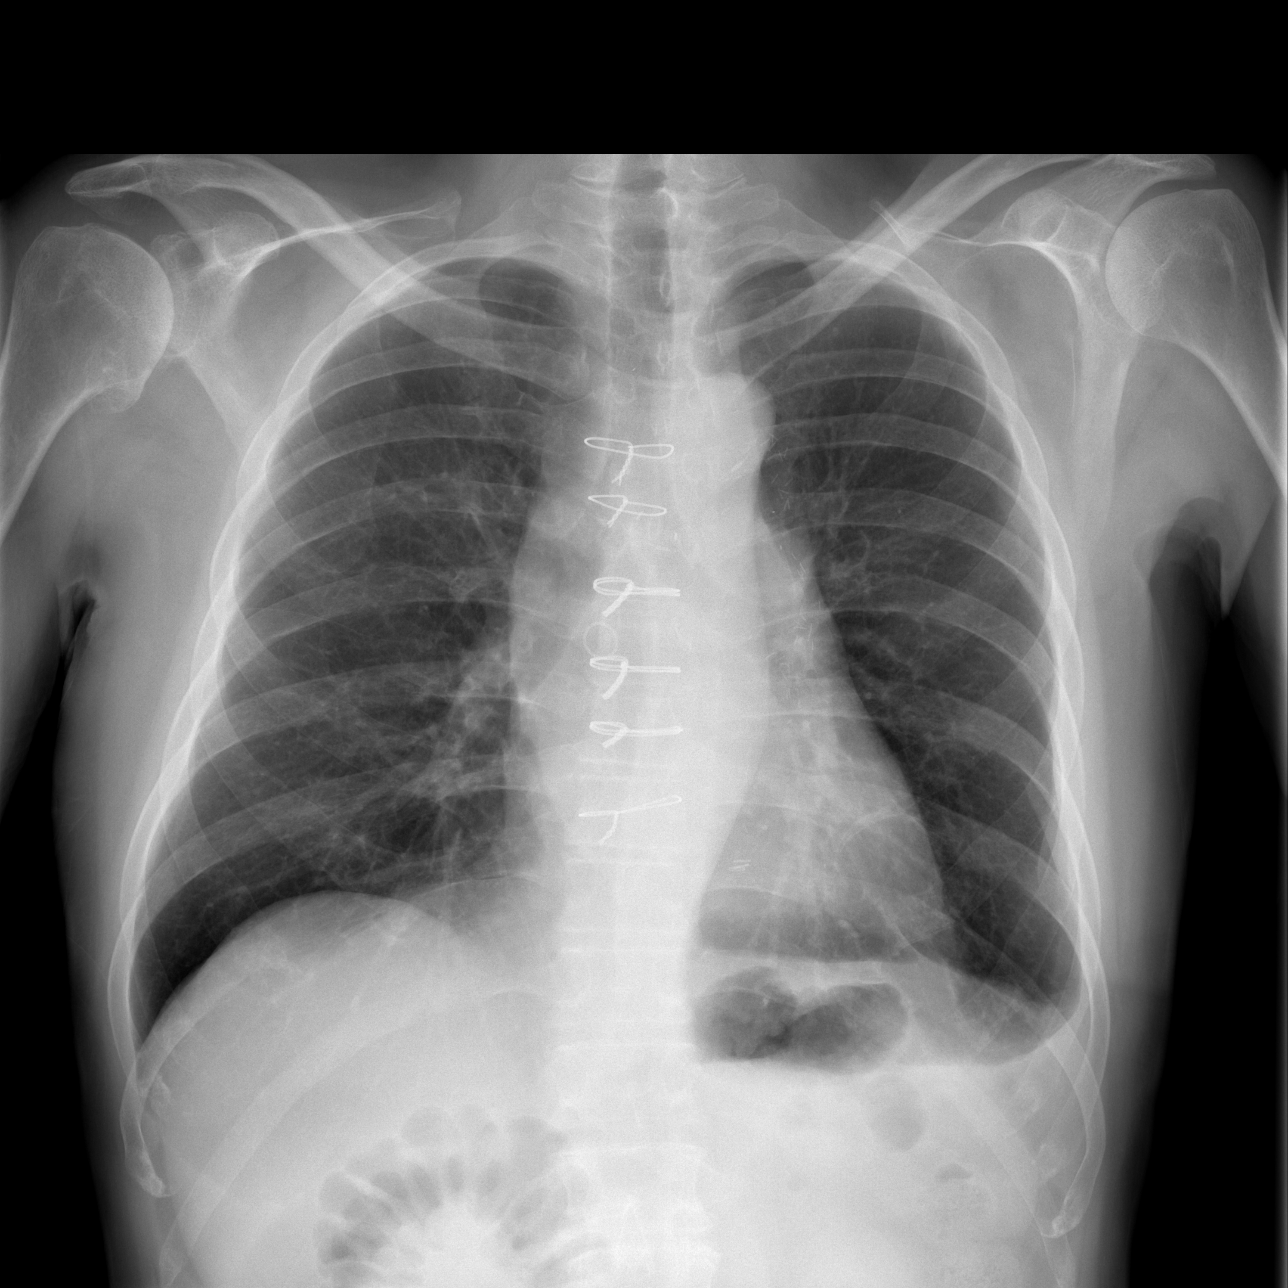

[w chest lat]
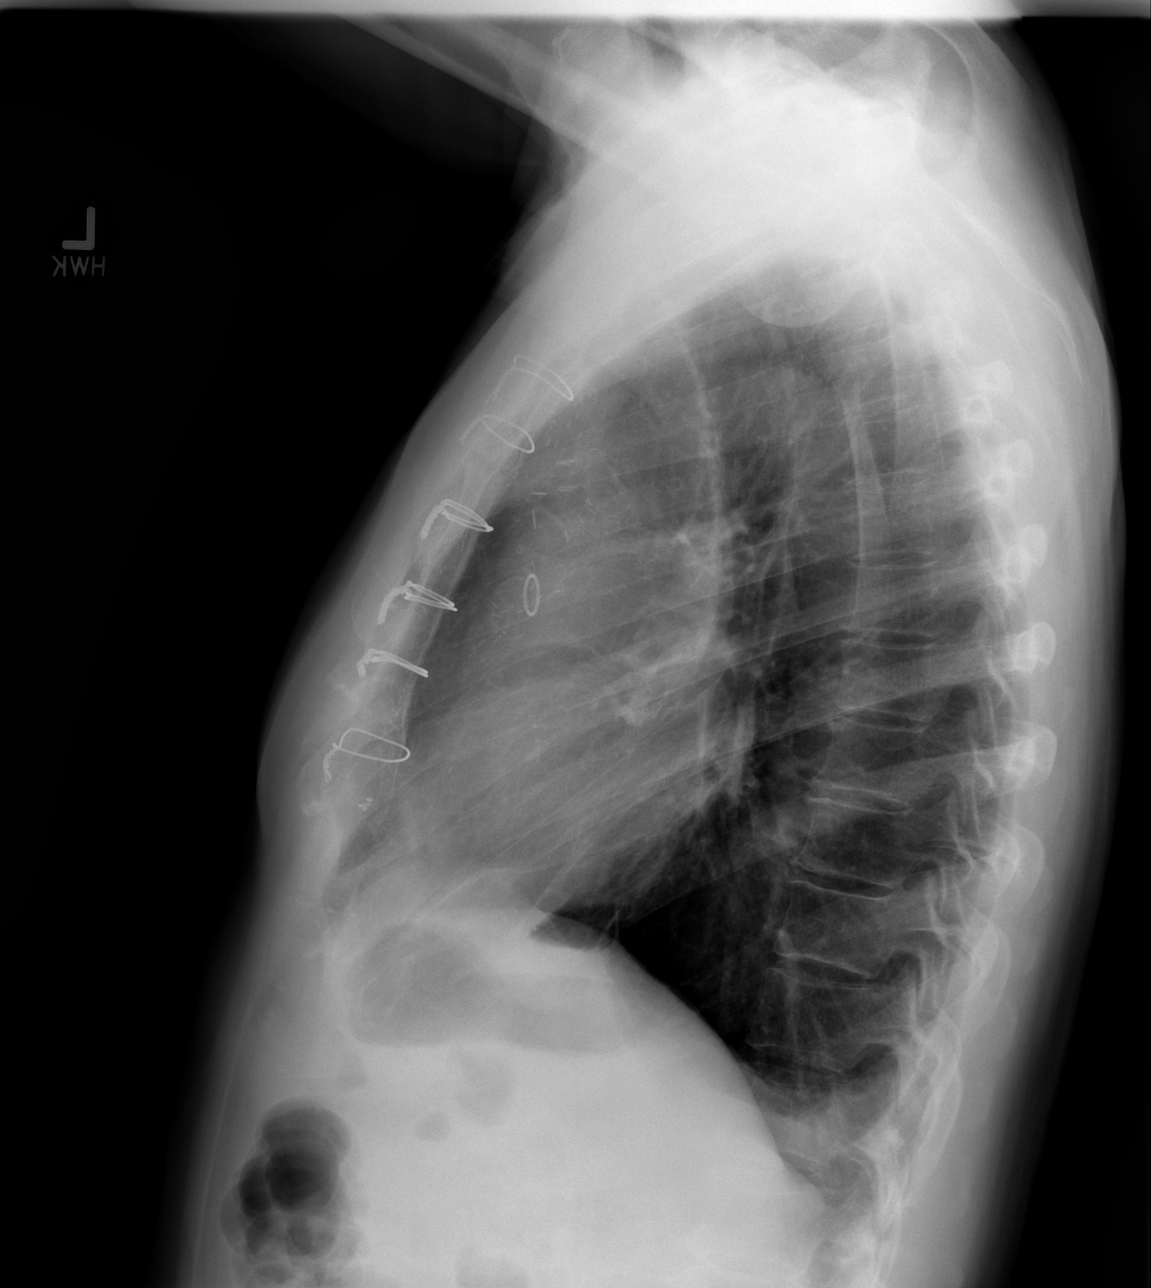

[w chest pa (2 of 2)]
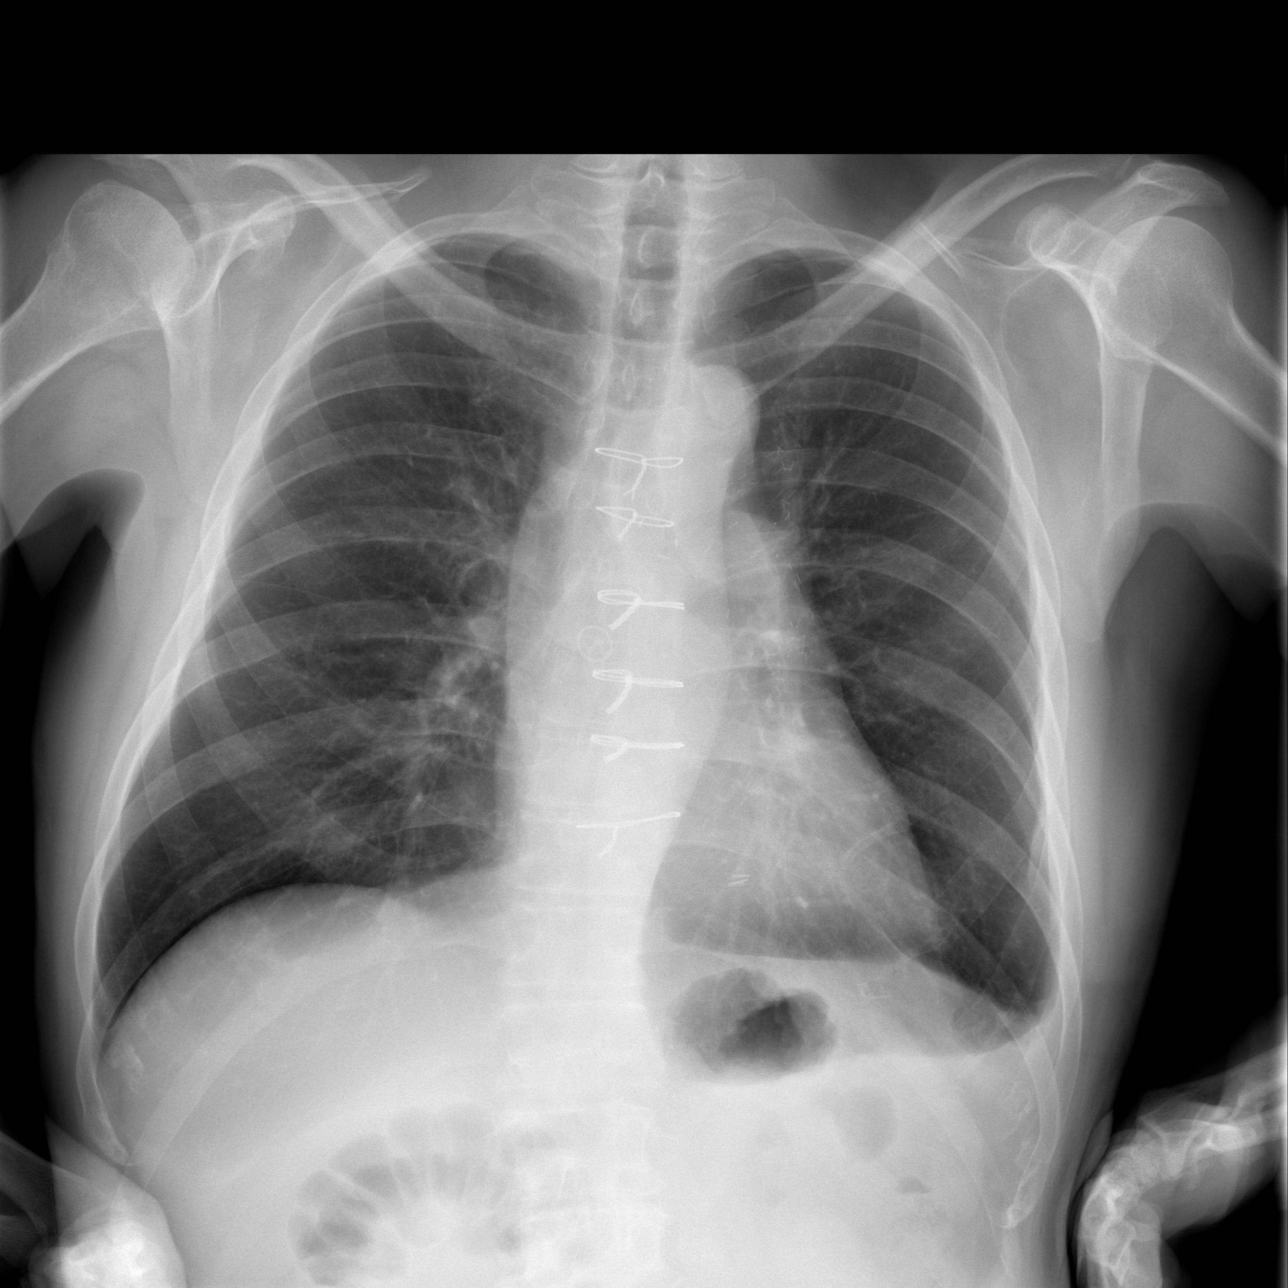

[3 of 3 positions shown; findings below may reference images not displayed]

FINDINGS: The right pleural effusion has resolved. Only a small left pleural
effusion remains. Aeration of the lungs has improved. Cardiomegaly
is stable. Median sternotomy sutures are intact.
IMPRESSION: 1. Resolution of small right pleural effusion.
2. Small left pleural effusion remains. Improved aeration.

## 2015-12-16 ENCOUNTER — Other Ambulatory Visit: Payer: Self-pay | Admitting: Cardiovascular Disease

## 2015-12-24 NOTE — Progress Notes (Signed)
Cardiology Office Note   Date:  12/25/2015   ID:  Steve Hickman, Steve Hickman [birth year], MRN [id number]  PCP:  No PCP Per Patient  Cardiologist:  Dr. Gwenlyn Found    12 month f/u    History of Present Illness: Steve Hickman is a 75 y.o. male with a history of CAD s/p PCI/BMS to LCx followed by CABG x2V (2014), LBBB, HLD and post op afib who present to clinic for yearly follow up.   History of CAD status post inferior STEMI 05/28/13. He was taken emergently to the Cath Lab and underwent PCI with bare metal stent to the proximal codominant circumflex. He had residual disease and underwent elective coronary artery bypass grafting with a LIMA to his LAD and a vein to the PDA 07/07/13 by Dr. Roxan Hockey. He did have some postop A. Fib converting to sinus rhythm on amiodarone.  He was last seen by Dr. Gwenlyn Found in 11/2014 and doing well from a cardiac standpoint.   Today he presents to clinic for follow up. No chest pain or SOB. He is active with work delivering car parts. He does a lot lifting and walking. No LE edema, orthopnea, or PND. No dizziness, palpitations or syncope. No blood in his stool or urine. He is feeling great and has no complaints.     Past Medical History  Diagnosis Date  . Medical history non-contributory   . Diabetes mellitus ([place]) 05/31/2013  . Dyslipidemia, goal LDL below 70 05/31/2013  . Coronary artery disease     STEMI  . Shortness of breath     with exertion  . STEMI (ST elevation myocardial infarction)- CFX BMS, 05/28/2013 05/29/2013    STEMI   . 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 Left bundle branch block     Past Surgical History  Procedure Laterality Date  . No past surgeries    . Coronary artery bypass graft N/A 07/07/2013    Procedure: CORONARY ARTERY BYPASS GRAFTING (CABG);  Surgeon: Melrose Nakayama, MD;  Location: [facility];  Service: Open Heart Surgery;  Laterality: N/A;  CABG X 2  . Left heart cath N/A 05/28/2013    Procedure: LEFT  HEART CATH;  Surgeon: Lorretta Harp, MD;  [facility] C[facility] LAB;  Service: Cardiovascular;  Laterality: N/A;  . Percutaneous coronary stent intervention (pci-s)  05/28/2013    Procedure: PERCUTANEOUS CORONARY STENT INTERVENTION (PCI-S);  Surgeon: Lorretta Harp, MD;  Location: [facility] CATH LAB;  Service: Cardiovascular;;  prox cx     Current Outpatient Prescriptions  Medication Sig Dispense Refill  . aspirin EC 81 MG EC tablet Take 1 tablet (81 mg total) by mouth daily.    Marland Kitchen atorvastatin (LIPITOR) 80 MG tablet Take 1 tablet (80 mg total) by mouth daily at 6 PM. 30 tablet 11  . metoprolol tartrate (LOPRESSOR) 25 MG tablet Take 0.5 tablets (12.5 mg total) by mouth 2 (two) times daily. 30 tablet 11  . pantoprazole (PROTONIX) 40 MG tablet Take 1 tablet (40 mg total) by mouth daily. 30 tablet 6   No current facility-administered medications for this visit.    Allergies:   Review of patient's allergies indicates no known allergies.    Social History:  The patient  reports that he has never smoked. He has never used smokeless tobacco. He reports that he drinks about 0.6 oz of alcohol per week. He reports that he does not use illicit drugs.   Family History:  The patient's family history includes Healthy in his father, mother, and sister; Stroke in his father. There is no history of Heart attack or Hypertension.    ROS:  Please see the history of present illness.   Otherwise, review of systems are positive for none.   All other systems are reviewed and negative.    PHYSICAL EXAM: VS:  BP 130/90 mmHg  Pulse 80  Ht 5\' 5"  (1.651 m)  Wt 145 lb 12.8 oz (66.134 kg)  BMI 24.26 kg/m2 , BMI Body mass index is 24.26 kg/(m^2). GEN: Well nourished, well developed, in no acute distress HEENT: normal Neck: no JVD, carotid bruits, or masses Cardiac: RRR; no murmurs, rubs, or gallops,no edema  Respiratory:  clear to auscultation bilaterally, normal work of breathing GI: soft, nontender, nondistended, +  BS MS: no deformity or atrophy Skin: warm and dry, no rash Neuro:  Strength and sensation are intact Psych: euthymic mood, full affect   EKG:  EKG is ordered today. The ekg ordered today demonstrates HR 85, NSR with LBBB   Recent Labs: No results found for requested labs within last 365 days.    Lipid Panel    Component Value Date/Time   CHOL 82 12/07/2014 1140   TRIG 73 12/07/2014 1140   HDL 30* 12/07/2014 1140   CHOLHDL 2.7 12/07/2014 1140   VLDL 15 12/07/2014 1140   LDLCALC 37 12/07/2014 1140      Wt Readings from Last 3 Encounters:  12/25/15 145 lb 12.8 oz (66.134 kg)  12/01/14 158 lb 4.8 oz (71.804 kg)  09/05/13 142 lb 3.2 oz (64.501 kg)      Other studies Reviewed: Additional studies/ records that were reviewed today include: 2D ECHO Review of the above records demonstrates:   2D ECHO: 07/09/2013 LV EF: 55% - 60% Study Conclusions - Procedure narrative: Transthoracic echocardiography. Image quality was suboptimal. The study was technically difficult, as a result of poor sound wave transmission, surgical dressings, and excessive abdominal air. - Left ventricle: The cavity size was normal. Wall thickness was increased in a pattern of mild LVH. Systolic function was normal. The estimated ejection fraction was in the range of 55% to 60%. Incoordinate post-operative and LBBB-relatedseptal motion. Doppler parameters are consistent with abnormal left ventricular relaxation (grade 1 diastolic dysfunction). The E/e' ratio is <10, suggesting normal LV filling pressure. - Aortic valve: Poorly visualized. Sclerosis without stenosis. No regurgitation. - Mitral valve: Calcified annulus. Leaflet separation was normal. Trivial regurgitation. - Left atrium: The atrium was at the upper limits of normal in size.   ASSESSMENT AND PLAN: Steve Hickman is a 75 y.o. male with a history of CAD s/p PCI/BMS to LCx followed by CABG x2V (2014),  LBBB, HLD and post op afib who present to clinic for yearly follow up.   CAD: stable. Continue ASA, statin and BB.   HLD: continue statin. LDL in 11/2014 excellent at 37 with normal LFTs.   LBBB: chronic  PAF: felt to be related to surgery. No recurrence.    Current medicines are reviewed at length with the patient today.  The patient does not have concerns regarding medicines.  The following changes have been made:  no change  Labs/ tests ordered today include: none  Orders Placed This Encounter  Procedures  . EKG 12-Lead     Disposition:   FU with Dr. Gwenlyn Found in 1 year  Signed, Eileen Stanford, PA-C  12/25/2015 3:11 PM    [facility] [id number]  [address], [place], [place]  [address] Phone: [phone]; Fax: [phone]

## 2015-12-25 ENCOUNTER — Ambulatory Visit (INDEPENDENT_AMBULATORY_CARE_PROVIDER_SITE_OTHER): Payer: Medicare Other | Admitting: Physician Assistant

## 2015-12-25 ENCOUNTER — Encounter: Payer: Self-pay | Admitting: Physician Assistant

## 2015-12-25 VITALS — BP 130/90 | HR 80 | Ht 65.0 in | Wt 145.8 lb

## 2015-12-25 DIAGNOSIS — I25810 Atherosclerosis of coronary artery bypass graft(s) without angina pectoris: Secondary | ICD-10-CM

## 2015-12-25 MED ORDER — ATORVASTATIN CALCIUM 80 MG PO TABS: 80.0000 mg | ORAL_TABLET | Freq: Every day | ORAL | Status: DC

## 2015-12-25 MED ORDER — METOPROLOL TARTRATE 25 MG PO TABS: 12.5000 mg | ORAL_TABLET | Freq: Two times a day (BID) | ORAL | Status: DC

## 2015-12-25 NOTE — Patient Instructions (Signed)
Medication Instructions:  Your physician recommends that you continue on your current medications as directed. Please refer to the Current Medication list given to you today.   Labwork: None ordered  Testing/Procedures: None ordered  Follow-Up: Your physician wants you to follow-up in: 1 year with Dr.Berry You will receive a reminder letter in the mail two months in advance. If you don't receive a letter, please call our office to schedule the follow-up appointment.   Any Other Special Instructions Will Be Listed Below (If Applicable).     If you need a refill on your cardiac medications before your next appointment, please call your pharmacy.

## 2016-01-02 ENCOUNTER — Other Ambulatory Visit: Payer: Self-pay | Admitting: Cardiovascular Disease

## 2016-01-03 NOTE — Telephone Encounter (Signed)
Rx(s) sent to pharmacy electronically.  

## 2016-05-05 DIAGNOSIS — D649 Anemia, unspecified: Secondary | ICD-10-CM | POA: Diagnosis not present

## 2016-05-05 DIAGNOSIS — E559 Vitamin D deficiency, unspecified: Secondary | ICD-10-CM | POA: Diagnosis not present

## 2016-05-05 DIAGNOSIS — E109 Type 1 diabetes mellitus without complications: Secondary | ICD-10-CM | POA: Diagnosis not present

## 2016-05-05 DIAGNOSIS — E785 Hyperlipidemia, unspecified: Secondary | ICD-10-CM | POA: Diagnosis not present

## 2016-05-05 DIAGNOSIS — Z125 Encounter for screening for malignant neoplasm of prostate: Secondary | ICD-10-CM | POA: Diagnosis not present

## 2016-05-05 DIAGNOSIS — D4959 Neoplasm of unspecified behavior of other genitourinary organ: Secondary | ICD-10-CM | POA: Diagnosis not present

## 2016-05-05 DIAGNOSIS — Z79899 Other long term (current) drug therapy: Secondary | ICD-10-CM | POA: Diagnosis not present

## 2016-05-09 DIAGNOSIS — R3121 Asymptomatic microscopic hematuria: Secondary | ICD-10-CM | POA: Diagnosis not present

## 2016-05-09 DIAGNOSIS — N4289 Other specified disorders of prostate: Secondary | ICD-10-CM | POA: Diagnosis not present

## 2016-05-09 DIAGNOSIS — R310 Gross hematuria: Secondary | ICD-10-CM | POA: Diagnosis not present

## 2016-05-09 DIAGNOSIS — N400 Enlarged prostate without lower urinary tract symptoms: Secondary | ICD-10-CM | POA: Diagnosis not present

## 2016-05-14 DIAGNOSIS — R310 Gross hematuria: Secondary | ICD-10-CM | POA: Diagnosis not present

## 2016-05-22 DIAGNOSIS — R310 Gross hematuria: Secondary | ICD-10-CM | POA: Diagnosis not present

## 2016-05-26 DIAGNOSIS — R319 Hematuria, unspecified: Secondary | ICD-10-CM | POA: Diagnosis not present

## 2016-05-26 DIAGNOSIS — E109 Type 1 diabetes mellitus without complications: Secondary | ICD-10-CM | POA: Diagnosis not present

## 2016-06-25 DIAGNOSIS — E109 Type 1 diabetes mellitus without complications: Secondary | ICD-10-CM | POA: Diagnosis not present

## 2016-08-13 DIAGNOSIS — Z23 Encounter for immunization: Secondary | ICD-10-CM | POA: Diagnosis not present

## 2016-08-21 DIAGNOSIS — E109 Type 1 diabetes mellitus without complications: Secondary | ICD-10-CM | POA: Diagnosis not present

## 2016-10-01 DIAGNOSIS — L03818 Cellulitis of other sites: Secondary | ICD-10-CM | POA: Diagnosis not present

## 2016-10-01 DIAGNOSIS — E109 Type 1 diabetes mellitus without complications: Secondary | ICD-10-CM | POA: Diagnosis not present

## 2016-11-19 DIAGNOSIS — E109 Type 1 diabetes mellitus without complications: Secondary | ICD-10-CM | POA: Diagnosis not present

## 2016-12-22 ENCOUNTER — Other Ambulatory Visit: Payer: Self-pay | Admitting: Physician Assistant

## 2016-12-23 ENCOUNTER — Other Ambulatory Visit: Payer: Self-pay | Admitting: *Deleted

## 2016-12-23 MED ORDER — METOPROLOL TARTRATE 25 MG PO TABS: 12.5000 mg | ORAL_TABLET | Freq: Two times a day (BID) | ORAL | 0 refills | Status: DC

## 2016-12-23 MED ORDER — ATORVASTATIN CALCIUM 80 MG PO TABS
80.0000 mg | ORAL_TABLET | Freq: Every day | ORAL | 0 refills | Status: DC
Start: 2016-12-23 — End: 2017-01-16

## 2016-12-29 ENCOUNTER — Other Ambulatory Visit: Payer: Self-pay | Admitting: Cardiovascular Disease

## 2016-12-29 NOTE — Telephone Encounter (Signed)
Rx has been sent to the pharmacy electronically. ° °

## 2017-01-16 ENCOUNTER — Telehealth: Payer: Self-pay | Admitting: Cardiovascular Disease

## 2017-01-16 MED ORDER — METOPROLOL TARTRATE 25 MG PO TABS: 12.5000 mg | ORAL_TABLET | Freq: Two times a day (BID) | ORAL | 0 refills | Status: DC

## 2017-01-16 MED ORDER — ATORVASTATIN CALCIUM 80 MG PO TABS: 80.0000 mg | ORAL_TABLET | Freq: Every day | ORAL | 0 refills | Status: DC

## 2017-01-16 NOTE — Telephone Encounter (Signed)
Rx sent to pharmacy electronically.  Patient has refills for protonix.  Appt 4/2 with Dr. Gwenlyn Found.

## 2017-01-16 NOTE — Telephone Encounter (Signed)
New message    *STAT* If patient is at the pharmacy, call can be transferred to refill team.   1. Which medications need to be refilled? (please list name of each medication and dose if known) metropolol atorvastatin pantoprazole  2. Which pharmacy/location (including street and city if local pharmacy) is medication to be sent to? [facility] in [place]  3. Do they need a 30 day or 90 day supply? 30 day

## 2017-01-26 ENCOUNTER — Ambulatory Visit (INDEPENDENT_AMBULATORY_CARE_PROVIDER_SITE_OTHER): Payer: Medicare Other | Admitting: Cardiology

## 2017-01-26 ENCOUNTER — Encounter: Payer: Self-pay | Admitting: Cardiology

## 2017-01-26 VITALS — BP 133/84 | HR 71 | Ht 67.0 in | Wt 148.4 lb

## 2017-01-26 DIAGNOSIS — I2510 Atherosclerotic heart disease of native coronary artery without angina pectoris: Secondary | ICD-10-CM

## 2017-01-26 DIAGNOSIS — I447 Left bundle-branch block, unspecified: Secondary | ICD-10-CM | POA: Diagnosis not present

## 2017-01-26 DIAGNOSIS — I480 Paroxysmal atrial fibrillation: Secondary | ICD-10-CM

## 2017-01-26 DIAGNOSIS — E785 Hyperlipidemia, unspecified: Secondary | ICD-10-CM

## 2017-01-26 DIAGNOSIS — I2101 ST elevation (STEMI) myocardial infarction involving left main coronary artery: Secondary | ICD-10-CM | POA: Diagnosis not present

## 2017-01-26 DIAGNOSIS — I2109 ST elevation (STEMI) myocardial infarction involving other coronary artery of anterior wall: Secondary | ICD-10-CM

## 2017-01-26 DIAGNOSIS — Z951 Presence of aortocoronary bypass graft: Secondary | ICD-10-CM

## 2017-01-26 DIAGNOSIS — Z9861 Coronary angioplasty status: Secondary | ICD-10-CM | POA: Diagnosis not present

## 2017-01-26 DIAGNOSIS — I252 Old myocardial infarction: Secondary | ICD-10-CM | POA: Diagnosis not present

## 2017-01-26 HISTORY — DX: Atherosclerotic heart disease of native coronary artery without angina pectoris: I25.10

## 2017-01-26 NOTE — Assessment & Plan Note (Signed)
CFX PCI with BMS in setting of STEMI- brought back later for CABG x 2

## 2017-01-26 NOTE — Progress Notes (Signed)
01/26/2017 Steve Hickman   1941/04/01  [id number]  Primary Physician Tamsen Roers, MD Primary Cardiologist: Dr Gwenlyn Found  HPI:  76 y/o WM, lives alone in his own home, who had not seen any physician in years,  presented to the ER with an inferior STEMI 05/28/13.  He was seen and evaluated and taken emergently to the cath lab. He underwent PCI with BMS to proximal codominant LCX. He had residual disease and later underwent elective CABG X 2 with LIMA-LAD, SVG-PD on 07/07/13 by Dr Roxan Hockey. Post op he had PAF briefly treated with Amiodarone, he has not had recurrence. Echo 07/09/13 showed normal LVF-EF 55-60%, LA upper normal.  He is here for a one year follow up (LOV was 12/24/15). He denies any chest pain or tachycardia. He walks his dogs daily. He has had no other hospitalizations. He has no problems with his medications.     Current Outpatient Prescriptions  Medication Sig Dispense Refill  . aspirin EC 81 MG EC tablet Take 1 tablet (81 mg total) by mouth daily.    Marland Kitchen atorvastatin (LIPITOR) 80 MG tablet Take 1 tablet (80 mg total) by mouth daily at 6 PM. KEEP FOLLOW UP APPOINTMENT for further refills 30 tablet 0  . metoprolol tartrate (LOPRESSOR) 25 MG tablet Take 0.5 tablets (12.5 mg total) by mouth 2 (two) times daily. KEEP FOLLOW UP APPOINTMENT for further refills 30 tablet 0  . pantoprazole (PROTONIX) 40 MG tablet TAKE ONE TABLET BY MOUTH ONCE DAILY 30 tablet 1   No current facility-administered medications for this visit.     No Known Allergies  Past Medical History:  Diagnosis Date  . 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 Coronary artery disease    STEMI  . Diabetes mellitus ([place]) 05/31/2013  . Dyslipidemia, goal LDL below 70 05/31/2013  . Left bundle branch block   . Medical history non-contributory   . Shortness of breath    with exertion  . STEMI (ST elevation myocardial infarction)- CFX BMS, 05/28/2013 05/29/2013   STEMI     Social History    Social History  . Marital status: Married    Spouse name: N/A  . Number of children: N/A  . Years of education: N/A   Occupational History  . Not on file.   Social History Main Topics  . Smoking status: Never Smoker  . Smokeless tobacco: Never Used  . Alcohol use 0.6 oz/week    1 Shots of liquor per week  . Drug use: No  . Sexual activity: Not on file   Other Topics Concern  . Not on file   Social History Narrative  . No narrative on file     Family History  Problem Relation Age of Onset  . Healthy Mother     NO CARDIAC RELATED HEALTH ISSUES   . Healthy Father     NO CARDIAC RELATED HEALTH ISSUES   . Stroke Father   . Healthy Sister     NO CARDIAC RELATED HEALTH ISSUES   . Heart attack Neg Hx   . Hypertension Neg Hx      Review of Systems: General: negative for chills, fever, night sweats or weight changes.  Cardiovascular: negative for chest pain, dyspnea on exertion, edema, orthopnea, palpitations, paroxysmal nocturnal dyspnea or shortness of breath Dermatological: negative for rash Respiratory: negative for cough or wheezing Urologic: negative for hematuria Abdominal: negative for nausea, vomiting, diarrhea, bright red blood per rectum, melena, or  hematemesis Neurologic: negative for visual changes, syncope, or dizziness All other systems reviewed and are otherwise negative except as noted above.    Blood pressure 133/84, pulse 71, height 5\' 7"  (1.702 m), weight 148 lb 6.4 oz (67.3 kg).  General appearance: alert, cooperative and no distress Neck: no carotid bruit and no JVD Lungs: clear to auscultation bilaterally Heart: regular rate and rhythm Extremities: extremities normal, atraumatic, no cyanosis or edema Skin: Skin color, texture, turgor normal. No rashes or lesions Neurologic: Grossly normal  EKG NSR, LBBB  ASSESSMENT AND PLAN:   Hx of CABG CAD s/p CABG x 2 (LIMA-LAD, SV-PDA) 07/07/13 (EF 60%)   LBBB (left bundle branch  block) Chronic  Paroxysmal a-fib - post-op CABBG No recurrence  Dyslipidemia, goal LDL below 70 Last LDL < 50, followed by PCP  CAD S/P percutaneous coronary angioplasty CFX PCI with BMS in setting of STEMI- brought back later for CABG x 2   PLAN  Same Rx- f/u one year with dr Celine Ahr [facility] PA-C 01/26/2017 11:50 AM

## 2017-01-26 NOTE — Assessment & Plan Note (Signed)
CAD s/p CABG x 2 (LIMA-LAD, SV-PDA) 07/07/13 (EF 60%)

## 2017-01-26 NOTE — Assessment & Plan Note (Signed)
Last LDL < 50, followed by PCP

## 2017-01-26 NOTE — Assessment & Plan Note (Addendum)
No recurrence. 

## 2017-01-26 NOTE — Patient Instructions (Signed)
Medication Instructions:  Continue current medications  Labwork: None Ordered  Testing/Procedures: None Ordered  Follow-Up: Your physician wants you to follow-up in: 1 Year with Dr Gwenlyn Found. You will receive a reminder letter in the mail two months in advance. If you don't receive a letter, please call our office to schedule the follow-up appointment.   Any Other Special Instructions Will Be Listed Below (If Applicable).   If you need a refill on your cardiac medications before your next appointment, please call your pharmacy.

## 2017-01-26 NOTE — Assessment & Plan Note (Signed)
Chronic. 

## 2017-02-16 DIAGNOSIS — E109 Type 1 diabetes mellitus without complications: Secondary | ICD-10-CM | POA: Diagnosis not present

## 2017-02-22 ENCOUNTER — Other Ambulatory Visit: Payer: Self-pay | Admitting: Cardiovascular Disease

## 2017-02-23 NOTE — Telephone Encounter (Signed)
REFILL 

## 2017-02-24 ENCOUNTER — Other Ambulatory Visit: Payer: Self-pay

## 2017-02-24 MED ORDER — ATORVASTATIN CALCIUM 80 MG PO TABS: ORAL_TABLET | ORAL | 3 refills | Status: DC

## 2017-02-24 MED ORDER — METOPROLOL TARTRATE 25 MG PO TABS: ORAL_TABLET | ORAL | 3 refills | Status: DC

## 2017-03-01 ENCOUNTER — Other Ambulatory Visit: Payer: Self-pay | Admitting: Cardiovascular Disease

## 2017-03-02 DIAGNOSIS — E109 Type 1 diabetes mellitus without complications: Secondary | ICD-10-CM | POA: Diagnosis not present

## 2017-05-02 ENCOUNTER — Other Ambulatory Visit: Payer: Self-pay | Admitting: Cardiovascular Disease

## 2018-02-20 ENCOUNTER — Other Ambulatory Visit: Payer: Self-pay | Admitting: Cardiovascular Disease

## 2018-02-22 NOTE — Telephone Encounter (Signed)
Rx sent to pharmacy   

## 2018-02-25 ENCOUNTER — Other Ambulatory Visit: Payer: Self-pay | Admitting: Cardiovascular Disease

## 2018-02-25 NOTE — Telephone Encounter (Signed)
REFILL 

## 2018-04-15 ENCOUNTER — Other Ambulatory Visit: Payer: Self-pay | Admitting: Cardiovascular Disease

## 2018-04-15 ENCOUNTER — Encounter: Payer: Self-pay | Admitting: Cardiology

## 2018-04-15 NOTE — Progress Notes (Signed)
Cardiology Office Note   Date:  04/16/2018   ID:  Reznor, Ferrando 09-17-1941, MRN [id number]  PCP:  Tamsen Roers, MD  Cardiologist:   No primary care provider on file.    Chief Complaint  Patient presents with  . Coronary Artery Disease      History of Present Illness: Steve Hickman is a 77 y.o. male who presents for follow up of CAD.   He had an inferior STEMI 05/28/13. He was seen and evaluated and taken emergently to the cath lab. He underwent PCI with BMS to proximal codominant LCX. He had residual disease and later underwent elective CABG X 2 with LIMA-LAD, SVG-PD on 07/07/13 by Dr Roxan Hockey. Post op he had PAF briefly treated with Amiodarone, he has not had recurrence. Echo 07/09/13 showed normal LVF-EF 55-60%.    Since I last saw him he has done well.  The patient denies any new symptoms such as chest discomfort, neck or arm discomfort. There has been no new shortness of breath, PND or orthopnea. There have been no reported palpitations, presyncope or syncope.  He is walking the dog but does not exercise routinely.     Past Medical History:  Diagnosis Date  . 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 Diabetes mellitus ([place]) 05/31/2013  . Dyslipidemia, goal LDL below 70 05/31/2013  . Left bundle branch block   . STEMI (ST elevation myocardial infarction)- CFX BMS, 05/28/2013 05/29/2013   STEMI     Past Surgical History:  Procedure Laterality Date  . CORONARY ARTERY BYPASS GRAFT N/A 07/07/2013   Procedure: CORONARY ARTERY BYPASS GRAFTING (CABG);  Surgeon: Melrose Nakayama, MD;  Location: [place];  Service: Open Heart Surgery;  Laterality: N/A;  CABG X 2  . LEFT HEART CATH N/A 05/28/2013   Procedure: LEFT HEART CATH;  Surgeon: Lorretta Harp, MD;  Location: [facility] CATH LAB;  Service: Cardiovascular;  Laterality: N/A;  . NO PAST SURGERIES    . PERCUTANEOUS CORONARY STENT INTERVENTION (PCI-S)  05/28/2013   Procedure: PERCUTANEOUS CORONARY STENT  INTERVENTION (PCI-S);  Surgeon: Lorretta Harp, MD;  Location: [facility] CATH LAB;  Service: Cardiovascular;;  prox cx     Current Outpatient Medications  Medication Sig Dispense Refill  . glimepiride (AMARYL) 2 MG tablet Take 2 mg by mouth daily with breakfast.    . metFORMIN (GLUCOPHAGE) 1000 MG tablet Take 1,000 mg by mouth 2 (two) times daily with a meal.    . aspirin EC 81 MG EC tablet Take 1 tablet (81 mg total) by mouth daily.    Marland Kitchen atorvastatin (LIPITOR) 80 MG tablet Take 1 tablet (80 mg total) by mouth daily at 6 PM. KEEP OV. 90 tablet 0  . metoprolol tartrate (LOPRESSOR) 25 MG tablet TAKE 1/2 (ONE-HALF) TABLET BY MOUTH TWICE DAILY. Please call to make appointment for further refills 60 tablet 0  . pantoprazole (PROTONIX) 40 MG tablet TAKE 1 TABLET BY MOUTH ONCE DAILY 30 tablet 0   No current facility-administered medications for this visit.     Allergies:   Patient has no known allergies.    ROS:  Please see the history of present illness.   Otherwise, review of systems are positive for none.   All other systems are reviewed and negative.    PHYSICAL EXAM: VS:  BP 122/68   Pulse 69   Ht 5\' 7"  (1.702 m)   Wt 157 lb 3.2 oz (71.3 kg)  BMI 24.62 kg/m  , BMI Body mass index is 24.62 kg/m. GENERAL:  Well appearing NECK:  No jugular venous distention, waveform within normal limits, carotid upstroke brisk and symmetric, no bruits, no thyromegaly LUNGS:  Clear to auscultation bilaterally CHEST:  Well healed sternotomy scar. HEART:  PMI not displaced or sustained,S1 and S2 within normal limits, no S3, no S4, no clicks, no rubs, 2 out of 6 apical systolic murmur radiating slightly at the aortic outflow tract, no diastolic murmurs ABD:  Flat, positive bowel sounds normal in frequency in pitch, no bruits, no rebound, no guarding, no midline pulsatile mass, no hepatomegaly, no splenomegaly EXT:  2 plus pulses throughout, no edema, no cyanosis no clubbing    EKG:  EKG is ordered  today. The ekg ordered today demonstrates sinus rhythm, rate 69, left bundle branch block, no acute ST-T wave changes.   Recent Labs: No results found for requested labs within last 8760 hours.    Lipid Panel    Component Value Date/Time   CHOL 82 12/07/2014 1140   TRIG 73 12/07/2014 1140   HDL 30 (L) 12/07/2014 1140   CHOLHDL 2.7 12/07/2014 1140   VLDL 15 12/07/2014 1140   LDLCALC 37 12/07/2014 1140      Wt Readings from Last 3 Encounters:  04/16/18 157 lb 3.2 oz (71.3 kg)  01/26/17 148 lb 6.4 oz (67.3 kg)  12/25/15 145 lb 12.8 oz (66.1 kg)      Other studies Reviewed: Additional studies/ records that were reviewed today include: None. Review of the above records demonstrates:  Please see elsewhere in the note.     ASSESSMENT AND PLAN:  Hx of CABG The patient has no new sypmtoms.  No further cardiovascular testing is indicated.  We will continue with aggressive risk reduction and meds as listed.  LBBB (left bundle branch block) This is chronic.  No further testing.   Paroxysmal a-fib - post-op CABG There has been no recurrence of this.    No further change in treatment  Dyslipidemia, goal LDL below 70 I will try to get a copy of his LDL from his PCP.   DM He reports that his A1C was OK.  I will defer to Tamsen Roers, MD    Current medicines are reviewed at length with the patient today.  The patient does not have concerns regarding medicines.  The following changes have been made:  no change  Labs/ tests ordered today include: None  Orders Placed This Encounter  Procedures  . EKG 12-Lead     Disposition:   FU with me in 12 months.      Signed, Minus Breeding, MD  04/16/2018 4:10 PM    [facility]

## 2018-04-16 ENCOUNTER — Encounter: Payer: Self-pay | Admitting: Cardiology

## 2018-04-16 ENCOUNTER — Ambulatory Visit (INDEPENDENT_AMBULATORY_CARE_PROVIDER_SITE_OTHER): Payer: Medicare Other | Admitting: Cardiology

## 2018-04-16 VITALS — BP 122/68 | HR 69 | Ht 67.0 in | Wt 157.2 lb

## 2018-04-16 DIAGNOSIS — E118 Type 2 diabetes mellitus with unspecified complications: Secondary | ICD-10-CM

## 2018-04-16 DIAGNOSIS — I252 Old myocardial infarction: Secondary | ICD-10-CM | POA: Diagnosis not present

## 2018-04-16 DIAGNOSIS — I2510 Atherosclerotic heart disease of native coronary artery without angina pectoris: Secondary | ICD-10-CM | POA: Diagnosis not present

## 2018-04-16 DIAGNOSIS — I447 Left bundle-branch block, unspecified: Secondary | ICD-10-CM | POA: Diagnosis not present

## 2018-04-16 NOTE — Patient Instructions (Signed)
Medication Instructions:  Continue current medications  If you need a refill on your cardiac medications before your next appointment, please call your pharmacy.  Labwork: None Ordered   Testing/Procedures: None Ordered  Follow-Up: Your physician wants you to follow-up in: 1 Year with APP. You should receive a reminder letter in the mail two months in advance. If you do not receive a letter, please call our office [phone].     Thank you for choosing [facility] at [facility]!!

## 2018-04-25 ENCOUNTER — Other Ambulatory Visit: Payer: Self-pay | Admitting: Cardiovascular Disease

## 2018-05-19 ENCOUNTER — Other Ambulatory Visit: Payer: Self-pay | Admitting: Cardiovascular Disease

## 2018-06-03 ENCOUNTER — Other Ambulatory Visit: Payer: Self-pay | Admitting: Cardiovascular Disease

## 2019-03-22 ENCOUNTER — Telehealth: Payer: Self-pay | Admitting: *Deleted

## 2019-03-22 NOTE — Telephone Encounter (Signed)
Unable to leave a message, no voicemail.  

## 2019-04-22 ENCOUNTER — Other Ambulatory Visit: Payer: Self-pay | Admitting: Cardiology

## 2019-04-25 ENCOUNTER — Telehealth: Payer: Self-pay | Admitting: *Deleted

## 2019-04-25 NOTE — Telephone Encounter (Signed)
A message was left, re: follow up visit. 

## 2019-05-11 ENCOUNTER — Other Ambulatory Visit: Payer: Self-pay | Admitting: Cardiology

## 2019-05-18 NOTE — Progress Notes (Signed)
Cardiology Office Note   Date:  05/19/2019   ID:  Steve Hickman, Steve Hickman 03/03/1941, MRN [id number]  PCP:  Tamsen Roers, MD  Cardiologist:   No primary care provider on file.   No chief complaint on file.     History of Present Illness: Steve Hickman is a 78 y.o. male who presents for follow up of CAD.   He had an inferior STEMI 05/28/13. He was seen and evaluated and taken emergently to the cath lab. He underwent PCI with BMS to proximal codominant LCX. He had residual disease andlaterunderwent elective CABG X 2 with LIMA-LAD, SVG-PD on 07/07/13 by Dr Roxan Hockey. Post op he had PAF briefly treated with Amiodarone, he has not had recurrence. Echo 07/09/13 showed normal LVF-EF 55-60%.    Since I last saw him he has done OK.  He says he has some fleeting symptoms that feels like just a little beat.  This sounds more like a palpitation.  He never had chest pain.  He still does not have chest pressure, neck or arm discomfort.  He does not have any shortness of breath, PND or orthopnea.  He has had no weight gain or edema.   Past Medical History:  Diagnosis Date  . 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 Diabetes mellitus ([place]) 05/31/2013  . Dyslipidemia, goal LDL below 70 05/31/2013  . Left bundle branch block   . STEMI (ST elevation myocardial infarction)- CFX BMS, 05/28/2013 05/29/2013   STEMI     Past Surgical History:  Procedure Laterality Date  . CORONARY ARTERY BYPASS GRAFT N/A 07/07/2013   Procedure: CORONARY ARTERY BYPASS GRAFTING (CABG);  Surgeon: Melrose Nakayama, MD;  Location: [place];  Service: Open Heart Surgery;  Laterality: N/A;  CABG X 2  . LEFT HEART CATH N/A 05/28/2013   Procedure: LEFT HEART CATH;  Surgeon: Lorretta Harp, MD;  Location: [facility] CATH LAB;  Service: Cardiovascular;  Laterality: N/A;  . NO PAST SURGERIES    . PERCUTANEOUS CORONARY STENT INTERVENTION (PCI-S)  05/28/2013   Procedure: PERCUTANEOUS CORONARY STENT INTERVENTION  (PCI-S);  Surgeon: Lorretta Harp, MD;  [facility] CATH LAB;  Service: Cardiovascular;;  prox cx     Current Outpatient Medications  Medication Sig Dispense Refill  . aspirin EC 81 MG EC tablet Take 1 tablet (81 mg total) by mouth daily.    Marland Kitchen atorvastatin (LIPITOR) 80 MG tablet Take 1 tablet (80 mg total) by mouth daily at 6 PM. 90 tablet 3  . glimepiride (AMARYL) 2 MG tablet Take 2 mg by mouth daily with breakfast.    . metFORMIN (GLUCOPHAGE) 1000 MG tablet Take 1,000 mg by mouth 2 (two) times daily with a meal.    . metoprolol tartrate (LOPRESSOR) 25 MG tablet Take 1/2 (one-half) tablet by mouth twice daily 90 tablet 0  . pantoprazole (PROTONIX) 40 MG tablet Take 1 tablet by mouth once daily 90 tablet 0   No current facility-administered medications for this visit.     Allergies:   Patient has no known allergies.    ROS:  Please see the history of present illness.   Otherwise, review of systems are positive for none.   All other systems are reviewed and negative.    PHYSICAL EXAM: VS:  BP (!) 143/82   Pulse 64   Ht 5\' 7"  (1.702 m)   Wt 147 lb 12.8 oz (67 kg)   SpO2 96%   BMI  23.15 kg/m  , BMI Body mass index is 23.15 kg/m. GENERAL:  Well appearing NECK:  No jugular venous distention, waveform within normal limits, carotid upstroke brisk and symmetric, no bruits, no thyromegaly LUNGS:  Clear to auscultation bilaterally CHEST:  Unremarkable HEART:  PMI not displaced or sustained,S1 and S2 within normal limits, no S3, no S4, no clicks, no rubs, soft 2 out of 6 apical systolic nonradiating, no diastolic murmurs ABD:  Flat, positive bowel sounds normal in frequency in pitch, no bruits, no rebound, no guarding, no midline pulsatile mass, no hepatomegaly, no splenomegaly EXT:  2 plus pulses throughout, no edema, no cyanosis no clubbing   EKG:  EKG is ordered today. The ekg ordered today demonstrates sinus rhythm, left bundle branch block, rate 64, no significant change from  previous.   Recent Labs: No results found for requested labs within last 8760 hours.    Lipid Panel    Component Value Date/Time   CHOL 82 12/07/2014 1140   TRIG 73 12/07/2014 1140   HDL 30 (L) 12/07/2014 1140   CHOLHDL 2.7 12/07/2014 1140   VLDL 15 12/07/2014 1140   LDLCALC 37 12/07/2014 1140      Wt Readings from Last 3 Encounters:  05/19/19 147 lb 12.8 oz (67 kg)  04/16/18 157 lb 3.2 oz (71.3 kg)  01/26/17 148 lb 6.4 oz (67.3 kg)      Other studies Reviewed: Additional studies/ records that were reviewed today include: None. Review of the above records demonstrates:  Please see elsewhere in the note.     ASSESSMENT AND PLAN:  Hx of CABG The patient has no new sypmtoms.  No further cardiovascular testing is indicated.  We will continue with aggressive risk reduction and meds as listed.  LBBB (left bundle branch block) This is chronic.    Paroxysmal a-fib - post-op CABG He has no symptomatic paroxysms.  No change in therapy.   Dyslipidemia, goal LDL below 70 I am getting get the results from his primary provider.  I do not have these results.  DM I will check his A1c.  He probably would benefit from SGLT2 inhibitor or GLP-1 receptor antagonist but he would not be able afford these or understand the instructions of our clinical trial.   Current medicines are reviewed at length with the patient today.  The patient does not have concerns regarding medicines.  The following changes have been made:  no change  Labs/ tests ordered today include: None No orders of the defined types were placed in this encounter.   Disposition:   FU with me with me in 18 months.     Signed, Minus Breeding, MD  05/19/2019 4:20 PM    [facility]

## 2019-05-19 ENCOUNTER — Ambulatory Visit (INDEPENDENT_AMBULATORY_CARE_PROVIDER_SITE_OTHER): Payer: Medicare Other | Admitting: Cardiology

## 2019-05-19 ENCOUNTER — Other Ambulatory Visit: Payer: Self-pay

## 2019-05-19 ENCOUNTER — Telehealth: Payer: Self-pay | Admitting: Cardiology

## 2019-05-19 ENCOUNTER — Encounter: Payer: Self-pay | Admitting: Cardiology

## 2019-05-19 VITALS — BP 143/82 | HR 64 | Ht 67.0 in | Wt 147.8 lb

## 2019-05-19 DIAGNOSIS — E785 Hyperlipidemia, unspecified: Secondary | ICD-10-CM

## 2019-05-19 DIAGNOSIS — E118 Type 2 diabetes mellitus with unspecified complications: Secondary | ICD-10-CM

## 2019-05-19 DIAGNOSIS — I447 Left bundle-branch block, unspecified: Secondary | ICD-10-CM

## 2019-05-19 DIAGNOSIS — I2510 Atherosclerotic heart disease of native coronary artery without angina pectoris: Secondary | ICD-10-CM

## 2019-05-19 DIAGNOSIS — I480 Paroxysmal atrial fibrillation: Secondary | ICD-10-CM | POA: Diagnosis not present

## 2019-05-19 DIAGNOSIS — I252 Old myocardial infarction: Secondary | ICD-10-CM

## 2019-05-19 NOTE — Telephone Encounter (Signed)

## 2019-05-19 NOTE — Patient Instructions (Addendum)
Follow-Up: You will need a follow up appointment in 18 months.  Please call our office 2 months in advance to schedule this appointment.  You may see Minus Breeding, MD or one of the following Advanced Practice Providers on your designated Care Team:  Rosaria Ferries, PA-C   Jory Sims, DNP, ANP       Medication Instructions:  The current medical regimen is effective;  continue present plan and medications as directed. Please refer to the Current Medication list given to you today. If you need a refill on your cardiac medications before your next appointment, please call your pharmacy. Labwork: When you have labs (blood work) and your tests are completely normal, you will receive your results ONLY by [place] (if you have MyChart) -OR- A paper copy in the mail.  At [facility], you and your health needs are our priority.  As part of our continuing mission to provide you with exceptional heart care, we have created designated Provider Care Teams.  These Care Teams include your primary Cardiologist (physician) and Advanced Practice Providers (APPs -  Physician Assistants and Nurse Practitioners) who all work together to provide you with the care you need, when you need it.  Thank you for choosing [facility] at [facility]!!

## 2019-05-25 ENCOUNTER — Encounter: Payer: Self-pay | Admitting: Cardiology

## 2019-05-25 DIAGNOSIS — I2510 Atherosclerotic heart disease of native coronary artery without angina pectoris: Secondary | ICD-10-CM | POA: Insufficient documentation

## 2019-05-25 DIAGNOSIS — I252 Old myocardial infarction: Secondary | ICD-10-CM | POA: Insufficient documentation

## 2019-05-25 DIAGNOSIS — E785 Hyperlipidemia, unspecified: Secondary | ICD-10-CM | POA: Insufficient documentation

## 2019-05-31 ENCOUNTER — Other Ambulatory Visit: Payer: Self-pay | Admitting: Cardiovascular Disease

## 2019-07-21 ENCOUNTER — Other Ambulatory Visit: Payer: Self-pay | Admitting: Cardiology

## 2019-08-15 ENCOUNTER — Other Ambulatory Visit: Payer: Self-pay | Admitting: Cardiology

## 2019-09-05 DIAGNOSIS — Z9049 Acquired absence of other specified parts of digestive tract: Secondary | ICD-10-CM | POA: Insufficient documentation

## 2019-11-23 ENCOUNTER — Other Ambulatory Visit: Payer: Self-pay | Admitting: Cardiology

## 2020-05-23 ENCOUNTER — Other Ambulatory Visit: Payer: Self-pay | Admitting: Cardiology

## 2020-06-11 ENCOUNTER — Other Ambulatory Visit: Payer: Self-pay | Admitting: Cardiology

## 2020-06-29 ENCOUNTER — Other Ambulatory Visit: Payer: Self-pay | Admitting: Cardiology

## 2020-09-05 ENCOUNTER — Other Ambulatory Visit: Payer: Self-pay | Admitting: Cardiology

## 2020-10-07 ENCOUNTER — Other Ambulatory Visit: Payer: Self-pay | Admitting: Cardiology

## 2020-10-24 ENCOUNTER — Other Ambulatory Visit: Payer: Self-pay | Admitting: Cardiology

## 2020-10-26 ENCOUNTER — Other Ambulatory Visit: Payer: Self-pay | Admitting: Cardiology

## 2020-11-14 ENCOUNTER — Telehealth: Payer: Self-pay | Admitting: Cardiology

## 2020-11-14 MED ORDER — ATORVASTATIN CALCIUM 80 MG PO TABS: ORAL_TABLET | ORAL | 11 refills | Status: DC

## 2020-11-14 NOTE — Telephone Encounter (Signed)
Atorvastatin refilled.  

## 2020-11-14 NOTE — Telephone Encounter (Signed)
Pt c/o medication issue:  1. Name of Medication: atorvastatin (LIPITOR) 80 MG tablet  2. How are you currently taking this medication (dosage and times per day)? As written  3. Are you having a reaction (difficulty breathing--STAT)? No   4. What is your medication issue? Patient needed appt with Dr. Percival Spanish being prescribing medication. Has scheduled appt for 12/21/20 at 3:00 pm. Needs a 30 day supply of medication

## 2020-12-20 NOTE — Progress Notes (Signed)
Cardiology Office Note   Date:  12/21/2020   ID:  Steve, Hickman 1941-10-26, MRN [id number]  PCP:  Tamsen Roers, MD  Cardiologist:   Minus Breeding, MD   Chief Complaint  Patient presents with  . Coronary Artery Disease      History of Present Illness: Steve Hickman is a 80 y.o. male who presents for follow up of CAD.   He had an inferior STEMI 05/28/13. He was seen and evaluated and taken emergently to the cath lab. He underwent PCI with BMS to proximal codominant LCX. He had residual disease andlaterunderwent elective CABG X 2 with LIMA-LAD, SVG-PD on 07/07/13 by Dr Roxan Hockey. Post op he had PAF briefly treated with Amiodarone, he has not had recurrence. Echo 07/09/13 showed normal LVF-EF 55-60%.    Since I last saw him he has done okay.  His treatment was simply treating since had to move into some other housing and has to walk about 100 yards into his home.  He states with this he denies any shortness of breath or chest pain.  He has had no PND or orthopnea.  He has had no palpitations, presyncope or syncope.  He has had no weight gain or edema.   Past Medical History:  Diagnosis Date  . 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 Diabetes mellitus ([place]) 05/31/2013  . Dyslipidemia, goal LDL below 70 05/31/2013  . Left bundle branch block   . STEMI (ST elevation myocardial infarction)- CFX BMS, 05/28/2013 05/29/2013   STEMI     Past Surgical History:  Procedure Laterality Date  . CORONARY ARTERY BYPASS GRAFT N/A 07/07/2013   Procedure: CORONARY ARTERY BYPASS GRAFTING (CABG);  Surgeon: Melrose Nakayama, MD;  Location: [facility];  Service: Open Heart Surgery;  Laterality: N/A;  CABG X 2  . LEFT HEART CATH N/A 05/28/2013   Procedure: LEFT HEART CATH;  Surgeon: Lorretta Harp, MD;  Location: [facility] CATH LAB;  Service: Cardiovascular;  Laterality: N/A;  . NO PAST SURGERIES    . PERCUTANEOUS CORONARY STENT INTERVENTION (PCI-S)  05/28/2013   Procedure:  PERCUTANEOUS CORONARY STENT INTERVENTION (PCI-S);  Surgeon: Lorretta Harp, MD;  Location: [facility] CATH LAB;  Service: Cardiovascular;;  prox cx     Current Outpatient Medications  Medication Sig Dispense Refill  . aspirin EC 81 MG EC tablet Take 1 tablet (81 mg total) by mouth daily.    Marland Kitchen atorvastatin (LIPITOR) 80 MG tablet TAKE 1 TABLET BY MOUTH ONCE DAILY AT  6PM 30 tablet 11  . glimepiride (AMARYL) 2 MG tablet Take 2 mg by mouth daily with breakfast.    . metFORMIN (GLUCOPHAGE) 1000 MG tablet Take 1,000 mg by mouth 2 (two) times daily with a meal.    . metoprolol tartrate (LOPRESSOR) 25 MG tablet Take 1/2 (one-half) tablet by mouth twice daily 90 tablet 2  . pantoprazole (PROTONIX) 40 MG tablet Take 1 tablet by mouth once daily 90 tablet 2   No current facility-administered medications for this visit.    Allergies:   Patient has no known allergies.    ROS:  Please see the history of present illness.   Otherwise, review of systems are positive for none.   All other systems are reviewed and negative.    PHYSICAL EXAM: VS:  BP (!) 154/78 (BP Location: Left Arm, Patient Position: Sitting)   Pulse (!) 57   Ht 5\' 4"  (1.626 m)   Wt 135  lb 3.2 oz (61.3 kg)   SpO2 96%   BMI 23.21 kg/m  , BMI Body mass index is 23.21 kg/m. GENERAL:  Well appearing NECK:  No jugular venous distention, waveform within normal limits, carotid upstroke brisk and symmetric, no bruits, no thyromegaly LUNGS:  Clear to auscultation bilaterally CHEST:  Well healed sternotomy scar. HEART:  PMI not displaced or sustained,S1 and S2 within normal limits, no S3, no S4, no clicks, no rubs, no murmurs ABD:  Flat, positive bowel sounds normal in frequency in pitch, no bruits, no rebound, no guarding, no midline pulsatile mass, no hepatomegaly, no splenomegaly EXT:  2 plus pulses throughout, no edema, no cyanosis no clubbing   EKG:  EKG is  ordered today. The ekg ordered today demonstrates sinus rhythm, left bundle  branch block, rate 57, no significant change from previous.   Recent Labs: No results found for requested labs within last 8760 hours.    Lipid Panel    Component Value Date/Time   CHOL 82 12/07/2014 1140   TRIG 73 12/07/2014 1140   HDL 30 (L) 12/07/2014 1140   CHOLHDL 2.7 12/07/2014 1140   VLDL 15 12/07/2014 1140   LDLCALC 37 12/07/2014 1140      Wt Readings from Last 3 Encounters:  12/21/20 135 lb 3.2 oz (61.3 kg)  05/19/19 147 lb 12.8 oz (67 kg)  04/16/18 157 lb 3.2 oz (71.3 kg)      Other studies Reviewed: Additional studies/ records that were reviewed today include: None. Review of the above records demonstrates:  Please see elsewhere in the note.     ASSESSMENT AND PLAN:  Hx of CABG The patient has no new sypmtoms.  No further cardiovascular testing is indicated.  We will continue with aggressive risk reduction and meds as listed.  LBBB (left bundle branch block) This is chronic.  No change in therapy.   Dyslipidemia, goal LDL below 70 I do not have his most recent lipids and I called the primary care office to get these.  The goal will be an LDL less than 70.   HTN His blood pressure is elevated.  However, this is quite unusual.  He can keep an eye on his blood pressure at home and it is not at target as he typically is on might have to uptitrate his medications.  DM I do not have the most recent A1c.  Of note we previously had talked about SGLT2 or GLP-1 receptor antagonist but he would not be able to afford these and would not be interested in them.  Current medicines are reviewed at length with the patient today.  The patient does not have concerns regarding medicines.  The following changes have been made:  None  Labs/ tests ordered today include: None  Orders Placed This Encounter  Procedures  . EKG 12-Lead    Disposition:   FU with me with me in 12 months.     Signed, Minus Breeding, MD  12/21/2020 3:40 PM    [facility]

## 2020-12-21 ENCOUNTER — Ambulatory Visit (INDEPENDENT_AMBULATORY_CARE_PROVIDER_SITE_OTHER): Payer: Medicare Other | Admitting: Cardiology

## 2020-12-21 ENCOUNTER — Other Ambulatory Visit: Payer: Self-pay

## 2020-12-21 ENCOUNTER — Encounter: Payer: Self-pay | Admitting: Cardiology

## 2020-12-21 VITALS — BP 154/78 | HR 57 | Ht 64.0 in | Wt 135.2 lb

## 2020-12-21 DIAGNOSIS — E785 Hyperlipidemia, unspecified: Secondary | ICD-10-CM | POA: Diagnosis not present

## 2020-12-21 DIAGNOSIS — I252 Old myocardial infarction: Secondary | ICD-10-CM

## 2020-12-21 DIAGNOSIS — I2510 Atherosclerotic heart disease of native coronary artery without angina pectoris: Secondary | ICD-10-CM | POA: Diagnosis not present

## 2020-12-21 DIAGNOSIS — I447 Left bundle-branch block, unspecified: Secondary | ICD-10-CM | POA: Diagnosis not present

## 2020-12-21 DIAGNOSIS — I480 Paroxysmal atrial fibrillation: Secondary | ICD-10-CM

## 2020-12-21 DIAGNOSIS — E118 Type 2 diabetes mellitus with unspecified complications: Secondary | ICD-10-CM

## 2020-12-21 NOTE — Patient Instructions (Signed)
Medication Instructions:  No changes *If you need a refill on your cardiac medications before your next appointment, please call your pharmacy*  Follow-Up: At [facility], you and your health needs are our priority.  As part of our continuing mission to provide you with exceptional heart care, we have created designated Provider Care Teams.  These Care Teams include your primary Cardiologist (physician) and Advanced Practice Providers (APPs -  Physician Assistants and Nurse Practitioners) who all work together to provide you with the care you need, when you need it.  Your next appointment:   12 month(s) You will receive a reminder letter in the mail two months in advance. If you don't receive a letter, please call our office to schedule the follow-up appointment.  The format for your next appointment:   In Person  Provider:   James Hochrein, MD   

## 2021-02-28 ENCOUNTER — Other Ambulatory Visit: Payer: Self-pay | Admitting: Cardiology

## 2021-02-28 NOTE — Telephone Encounter (Signed)
Rx(s) sent to pharmacy electronically.  

## 2021-03-26 ENCOUNTER — Other Ambulatory Visit: Payer: Self-pay | Admitting: Cardiology

## 2021-06-29 ENCOUNTER — Other Ambulatory Visit: Payer: Self-pay | Admitting: Cardiology

## 2021-10-01 ENCOUNTER — Other Ambulatory Visit: Payer: Self-pay | Admitting: Cardiology

## 2021-11-18 ENCOUNTER — Other Ambulatory Visit: Payer: Self-pay | Admitting: Cardiology

## 2021-11-18 NOTE — Progress Notes (Signed)
Cardiology Office Note   Date:  11/19/2021   ID:  Steve, Hickman 04/22/1941, MRN [id number]  PCP:  Tamsen Roers, MD  Cardiologist:   Minus Breeding, MD   Chief Complaint  Patient presents with   Dizziness      History of Present Illness: Steve Hickman is a 81 y.o. male who presents for follow up of CAD.   He had an inferior STEMI 05/28/13.  He was seen and evaluated and taken emergently to the cath lab. He underwent PCI with BMS to proximal codominant LCX. He had residual disease and later underwent elective CABG X 2 with LIMA-LAD, SVG-PD on 07/07/13 by Dr Roxan Hockey. Post op he had PAF briefly treated with Amiodarone, he has not had recurrence. Echo 07/09/13 showed normal LVF-EF 55-60%.     Since I last saw him he has not had any chest discomfort, neck or arm discomfort but he does not recall these as significant symptoms previously.  He does get dizzy.  Some of this is with changing positions.  He notices this when he sits up or bends over to pick something up.  He also notices at times and he sitting still and he just looks up.  He does not describe his heart racing or skipping except for very rare palpitations.  He has not had any frank syncope.  He has had no new shortness of breath, PND or orthopnea   Past Medical History:  Diagnosis Date   CAD s/p CABG x 2 (LIMA-LAD, SV-PDA) 07/07/13 (EF 60%)  05/29/2013   Diabetes mellitus ([place]) 05/31/2013   Dyslipidemia, goal LDL below 70 05/31/2013   Left bundle branch block    STEMI (ST elevation myocardial infarction)- CFX BMS, 05/28/2013 05/29/2013   STEMI     Past Surgical History:  Procedure Laterality Date   CORONARY ARTERY BYPASS GRAFT N/A 07/07/2013   Procedure: CORONARY ARTERY BYPASS GRAFTING (CABG);  Surgeon: Melrose Nakayama, MD;  Location: [facility];  Service: Open Heart Surgery;  Laterality: N/A;  CABG X 2   LEFT HEART CATH N/A 05/28/2013   Procedure: LEFT HEART CATH;  Surgeon: Lorretta Harp, MD;  Location: [facility] CATH LAB;   Service: Cardiovascular;  Laterality: N/A;   NO PAST SURGERIES     PERCUTANEOUS CORONARY STENT INTERVENTION (PCI-S)  05/28/2013   Procedure: PERCUTANEOUS CORONARY STENT INTERVENTION (PCI-S);  Surgeon: Lorretta Harp, MD;  Location[facility] CATH LAB;  Service: Cardiovascular;;  prox cx     Current Outpatient Medications  Medication Sig Dispense Refill   aspirin EC 81 MG EC tablet Take 1 tablet (81 mg total) by mouth daily.     atorvastatin (LIPITOR) 80 MG tablet TAKE 1 TABLET BY MOUTH ONCE DAILY AT  6PM 30 tablet 11   glimepiride (AMARYL) 2 MG tablet Take 2 mg by mouth daily with breakfast.     metFORMIN (GLUCOPHAGE) 1000 MG tablet Take 1,000 mg by mouth 2 (two) times daily with a meal.     metoprolol tartrate (LOPRESSOR) 25 MG tablet Take 1/2 (one-half) tablet by mouth twice daily 90 tablet 0   pantoprazole (PROTONIX) 40 MG tablet Take 1 tablet by mouth once daily 90 tablet 3   No current facility-administered medications for this visit.    Allergies:   Patient has no known allergies.    ROS:  Please see the history of present illness.   Otherwise, review of systems are positive for none.   All other systems are reviewed and negative.  PHYSICAL EXAM: VS:  BP (!) 168/88    Pulse 63    Ht 5\' 5"  (1.651 m)    Wt 136 lb (61.7 kg)    SpO2 92%    BMI 22.63 kg/m  , BMI Body mass index is 22.63 kg/m. GENERAL:  Well appearing NECK:  No jugular venous distention, waveform within normal limits, carotid upstroke brisk and symmetric, no bruits, no thyromegaly LUNGS:  Clear to auscultation bilaterally CHEST:  Well healed sternotomy scar. HEART:  PMI not displaced or sustained,S1 and S2 within normal limits, no S3, no S4, no clicks, no rubs, very brief soft apical systolic murmur, no radiating at the aortic outflow tract, no diastolic murmurs ABD:  Flat, positive bowel sounds normal in frequency in pitch, no bruits, no rebound, no guarding, no midline pulsatile mass, no hepatomegaly, no  splenomegaly EXT:  2 plus pulses throughout, no edema, no cyanosis no clubbing    EKG:  EKG is  ordered today. The ekg ordered  demonstrates sinus rhythm, left bundle branch block, rate 67, no significant change from previous..  Intermittent left bundle branch block with 1 narrow complex beats identified.   Recent Labs: No results found for requested labs within last 8760 hours.    Lipid Panel    Component Value Date/Time   CHOL 82 12/07/2014 1140   TRIG 73 12/07/2014 1140   HDL 30 (L) 12/07/2014 1140   CHOLHDL 2.7 12/07/2014 1140   VLDL 15 12/07/2014 1140   LDLCALC 37 12/07/2014 1140      Wt Readings from Last 3 Encounters:  11/19/21 136 lb (61.7 kg)  12/21/20 135 lb 3.2 oz (61.3 kg)  05/19/19 147 lb 12.8 oz (67 kg)      Other studies Reviewed: Additional studies/ records that were reviewed today include: Labs. Review of the above records demonstrates:  Please see elsewhere in the note.     ASSESSMENT AND PLAN:  Hx of CABG Is not having any anginal symptoms.  I am not suspecting ischemia.   I will continue with aggressive risk reduction.   LBBB (left bundle branch block) This is chronic.  However, given this and the dizziness I will check a 3-day monitor.   Dyslipidemia I will be checking a lipid profile even though he is not fasting today.  HTN His blood pressure is elevated and I am going to give him a blood pressure cuff with instructions on how to take his blood pressures.  I do not want to titrate his meds until he had further readings given his dizziness.   DM I will check an A1c.   DIZZINESS:   Will evaluate with the monitor as above.  Pupils equally orthostatic although he had a slight drop in his standing blood pressure and I suspect this may be the etiology of his dizziness.  Current medicines are reviewed at length with the patient today.  The patient does not have concerns regarding medicines.  The following changes have been made:   None  Labs/ tests ordered today include: None  Orders Placed This Encounter  Procedures   Basic metabolic panel   CBC   Lipid panel   TSH   Hemoglobin A1c   LONG TERM MONITOR (3-14 DAYS)   EKG 12-Lead    Disposition:   FU with me with me in 3 months.     Signed, Minus Breeding, MD  11/19/2021 5:11 PM    [place]

## 2021-11-19 ENCOUNTER — Ambulatory Visit (INDEPENDENT_AMBULATORY_CARE_PROVIDER_SITE_OTHER): Payer: Medicare Other | Admitting: Cardiology

## 2021-11-19 ENCOUNTER — Encounter: Payer: Self-pay | Admitting: Cardiology

## 2021-11-19 ENCOUNTER — Other Ambulatory Visit: Payer: Self-pay

## 2021-11-19 VITALS — BP 168/88 | HR 63 | Ht 65.0 in | Wt 136.0 lb

## 2021-11-19 DIAGNOSIS — I447 Left bundle-branch block, unspecified: Secondary | ICD-10-CM

## 2021-11-19 DIAGNOSIS — I252 Old myocardial infarction: Secondary | ICD-10-CM

## 2021-11-19 DIAGNOSIS — I2510 Atherosclerotic heart disease of native coronary artery without angina pectoris: Secondary | ICD-10-CM

## 2021-11-19 DIAGNOSIS — E785 Hyperlipidemia, unspecified: Secondary | ICD-10-CM | POA: Diagnosis not present

## 2021-11-19 DIAGNOSIS — R002 Palpitations: Secondary | ICD-10-CM

## 2021-11-19 DIAGNOSIS — I10 Essential (primary) hypertension: Secondary | ICD-10-CM

## 2021-11-19 DIAGNOSIS — E118 Type 2 diabetes mellitus with unspecified complications: Secondary | ICD-10-CM

## 2021-11-19 NOTE — Patient Instructions (Addendum)
Medication Instructions:  No changes *If you need a refill on your cardiac medications before your next appointment, please call your pharmacy*   Lab Work: Your provider would like for you to have the following labs today: A1C, lipid, BMET, CBC and Lipid  If you have labs (blood work) drawn today and your tests are completely normal, you will receive your results only by: [place] (if you have MyChart) OR A paper copy in the mail If you have any lab test that is abnormal or we need to change your treatment, we will call you to review the results.   Testing/Procedures: [place]- Long Term Monitor Instructions  Your physician has requested you wear a ZIO patch monitor for 3 days.  This is a single patch monitor. [employer] supplies one patch monitor per enrollment. Additional stickers are not available. Please do not apply patch if you will be having a Nuclear Stress Test,  Echocardiogram, Cardiac CT, MRI, or Chest Xray during the period you would be wearing the  monitor. The patch cannot be worn during these tests. You cannot remove and re-apply the  ZIO XT patch monitor.  Your ZIO patch monitor will be mailed 3 day USPS to your address on file. It may take 3-5 days  to receive your monitor after you have been enrolled.  Once you have received your monitor, please review the enclosed instructions. Your monitor  has already been registered assigning a specific monitor serial # to you.  Billing and Patient Assistance Program Information  We have supplied [employer] with any of your insurance information on file for billing purposes. [employer] offers a sliding scale Patient Assistance Program for patients that do not have  insurance, or whose insurance does not completely cover the cost of the ZIO monitor.  You must apply for the Patient Assistance Program to qualify for this discounted rate.  To apply, please call [employer] at [phone], select option 4, select option 2, ask to apply for   Patient Assistance Program. Theodore Demark will ask your household income, and how many people  are in your household. They will quote your out-of-pocket cost based on that information.  [employer] will also be able to set up a 62-month, interest-free payment plan if needed.  Applying the monitor   Shave hair from upper left chest.  Hold abrader disc by orange tab. Rub abrader in 40 strokes over the upper left chest as  indicated in your monitor instructions.  Clean area with 4 enclosed alcohol pads. Let dry.  Apply patch as indicated in monitor instructions. Patch will be placed under collarbone on left  side of chest with arrow pointing upward.  Rub patch adhesive wings for 2 minutes. Remove white label marked "1". Remove the white  label marked "2". Rub patch adhesive wings for 2 additional minutes.  While looking in a mirror, press and release button in center of patch. A small green light will  flash 3-4 times. This will be your only indicator that the monitor has been turned on.  Do not shower for the first 24 hours. You may shower after the first 24 hours.  Press the button if you feel a symptom. You will hear a small click. Record Date, Time and  Symptom in the Patient Logbook.  When you are ready to remove the patch, follow instructions on the last 2 pages of Patient  Logbook. Stick patch monitor onto the last page of Patient Logbook.  Place Patient Logbook in the blue and white  box. Use locking tab on box and tape box closed  securely. The blue and white box has prepaid postage on it. Please place it in the mailbox as  soon as possible. Your physician should have your test results approximately 7 days after the  monitor has been mailed back to [facility].  Call [place] at [phone] if you have questions regarding  your ZIO XT patch monitor. Call them immediately if you see an orange light blinking on your  monitor.  If your monitor falls off in less than 4  days, contact our Monitor department at [phone].  If your monitor becomes loose or falls off after 4 days call [employer] at [phone] for  suggestions on securing your monitor    Follow-Up: At [facility], you and your health needs are our priority.  As part of our continuing mission to provide you with exceptional heart care, we have created designated Provider Care Teams.  These Care Teams include your primary Cardiologist (physician) and Advanced Practice Providers (APPs -  Physician Assistants and Nurse Practitioners) who all work together to provide you with the care you need, when you need it.  We recommend signing up for the patient portal called "MyChart".  Sign up information is provided on this After Visit Summary.  MyChart is used to connect with patients for Virtual Visits (Telemedicine).  Patients are able to view lab/test results, encounter notes, upcoming appointments, etc.  Non-urgent messages can be sent to your provider as well.   To learn more about what you can do with MyChart, go to [email or web address].    Your next appointment:   Follow up in 3 months with APP  Dr. Percival Spanish would like you to check your blood pressure daily for the next 2 weeks.  Keep a journal of these daily blood pressure and heart rate readings and call our office or send a message through [place] with the results. Thank you!  It is best to check your BP 1-2 hours after taking your medications to see the medications effectiveness on your BP.    Here are some tips that our clinical pharmacists share for home BP monitoring:          Rest 10 minutes before taking your blood pressure.          Don't smoke or drink caffeinated beverages for at least 30 minutes before.          Take your blood pressure before (not after) you eat.          Sit comfortably with your back supported and both feet on the floor (don't cross your legs).          Elevate your arm to heart level on a table or a  desk.          Use the proper sized cuff. It should fit smoothly and snugly around your bare upper arm. There should be enough room to slip a fingertip under the cuff. The bottom edge of the cuff should be 1 inch above the crease of the elbow.

## 2021-11-20 ENCOUNTER — Other Ambulatory Visit: Payer: Self-pay | Admitting: *Deleted

## 2021-11-20 ENCOUNTER — Ambulatory Visit: Payer: Medicare Other

## 2021-11-20 ENCOUNTER — Encounter: Payer: Self-pay | Admitting: *Deleted

## 2021-11-20 DIAGNOSIS — I2510 Atherosclerotic heart disease of native coronary artery without angina pectoris: Secondary | ICD-10-CM

## 2021-11-20 DIAGNOSIS — I10 Essential (primary) hypertension: Secondary | ICD-10-CM

## 2021-11-20 DIAGNOSIS — R002 Palpitations: Secondary | ICD-10-CM

## 2021-11-20 LAB — HEMOGLOBIN A1C
Est. average glucose Bld gHb Est-mCnc: 183 mg/dL
Hgb A1c MFr Bld: 8 % — ABNORMAL HIGH (ref 4.8–5.6)

## 2021-11-20 LAB — CBC
Hematocrit: 37.6 % (ref 37.5–51.0)
Hemoglobin: 12.9 g/dL — ABNORMAL LOW (ref 13.0–17.7)
MCH: 32.3 pg (ref 26.6–33.0)
MCHC: 34.3 g/dL (ref 31.5–35.7)
MCV: 94 fL (ref 79–97)
Platelets: 215 10*3/uL (ref 150–450)
RBC: 3.99 x10E6/uL — ABNORMAL LOW (ref 4.14–5.80)
RDW: 12 % (ref 11.6–15.4)
WBC: 9.4 10*3/uL (ref 3.4–10.8)

## 2021-11-20 LAB — BASIC METABOLIC PANEL
BUN/Creatinine Ratio: 12 (ref 10–24)
BUN: 15 mg/dL (ref 8–27)
CO2: 23 mmol/L (ref 20–29)
Calcium: 9.5 mg/dL (ref 8.6–10.2)
Chloride: 104 mmol/L (ref 96–106)
Creatinine, Ser: 1.29 mg/dL — ABNORMAL HIGH (ref 0.76–1.27)
Glucose: 169 mg/dL — ABNORMAL HIGH (ref 70–99)
Potassium: 5.2 mmol/L (ref 3.5–5.2)
Sodium: 144 mmol/L (ref 134–144)
eGFR: 55 mL/min/{1.73_m2} — ABNORMAL LOW (ref >59)

## 2021-11-20 LAB — LIPID PANEL
Chol/HDL Ratio: 2.7 ratio (ref 0.0–5.0)
Cholesterol, Total: 101 mg/dL (ref 100–199)
HDL: 38 mg/dL — ABNORMAL LOW (ref >39)
LDL Chol Calc (NIH): 41 mg/dL (ref 0–99)
Triglycerides: 123 mg/dL (ref 0–149)
VLDL Cholesterol Cal: 22 mg/dL (ref 5–40)

## 2021-11-20 LAB — TSH: TSH: 3.04 u[IU]/mL (ref 0.450–4.500)

## 2021-11-20 NOTE — Progress Notes (Unsigned)
Enrolled for [employer] to mail a ZIO XT long term holter monitor to the patients address on file.  

## 2021-11-30 ENCOUNTER — Other Ambulatory Visit: Payer: Self-pay | Admitting: Cardiology

## 2022-01-14 ENCOUNTER — Encounter: Payer: Self-pay | Admitting: *Deleted

## 2022-02-13 NOTE — Progress Notes (Signed)
? ?Cardiology Clinic Note  ? ?Patient Name: Steve Hickman ?Date of Encounter: 02/17/2022 ? ?Primary Care Provider:  Tamsen Roers, MD ?Primary Cardiologist:  Minus Breeding, MD ? ?Patient Profile  ?  ?Steve Hickman 81 year old male presents to the clinic today for follow-up evaluation of his coronary artery disease status post CABG and hyperlipidemia. ? ?Past Medical History  ?  ?Past Medical History:  ?Diagnosis Date  ? CAD s/p CABG x 2 (LIMA-LAD, SV-PDA) 07/07/13 (EF 60%)  05/29/2013  ? Diabetes mellitus ([place]) 05/31/2013  ? Dyslipidemia, goal LDL below 70 05/31/2013  ? Left bundle branch block   ? STEMI (ST elevation myocardial infarction)- CFX BMS, 05/28/2013 05/29/2013  ? STEMI   ? ?Past Surgical History:  ?Procedure Laterality Date  ? CORONARY ARTERY BYPASS GRAFT N/A 07/07/2013  ? Procedure: CORONARY ARTERY BYPASS GRAFTING (CABG);  Surgeon: Melrose Nakayama, MD;  Location: [facility];  Service: Open Heart Surgery;  Laterality: N/A;  CABG X 2  ? LEFT HEART CATH N/A 05/28/2013  ? Procedure: LEFT HEART CATH;  Surgeon: Lorretta Harp, MD;  Location: [facility] CATH LAB;  Service: Cardiovascular;  Laterality: N/A;  ? NO PAST SURGERIES    ? PERCUTANEOUS CORONARY STENT INTERVENTION (PCI-S)  05/28/2013  ? Procedure: PERCUTANEOUS CORONARY STENT INTERVENTION (PCI-S);  Surgeon: Lorretta Harp, MD;  Location[facility] CATH LAB;  Service: Cardiovascular;;  prox cx  ? ? ?Allergies ? ?No Known Allergies ? ?History of Present Illness  ?  ?Steve Hickman has a PMH of LBBB, paroxysmal atrial fibrillation post CABG coronary artery disease status post CABG 07/07/2013 by Dr. Roxan Hockey, diabetes, dyslipidemia, and inferior STEMI 05/2013. ? ?He received CABG x2 LIMA-LAD, SVG-PDA.  His echocardiogram 07/09/2013 showed normal LVEF 50-60%. ? ?He was seen by Dr. Percival Spanish 11/19/2021.  During that time he denied recurrent chest discomfort/pain.  He did note occasional dizziness.  He felt that some of this was with changing positions.  He specifically noted  sitting up and bending over.  He also noted dizziness with sitting still and looking up.  He denied heart racing and noted very rare palpitations.  He denied syncope.  He denied shortness of breath PND and orthopnea. ? ?He presents to the clinic today for follow-up evaluation and states he feels well.  He has dizziness has substantially improved.  He does continue to note occasional episodes of dizziness when he bends over.  However, he states that since he was here in January he has much improved.  We reviewed his lab work from 11/19/2021 which shows a elevated creatinine of 1.29.  We will recheck this today.  He states that in the spare time he enjoys looking at old coins and collecting them.  He sold his previous collection and is now getting back into collecting again.  I will order a BMP today, have him increase his physical activity as tolerated, and plan follow-up for 6 to 9 months. ? ?Today he denies chest pain, shortness of breath, lower extremity edema, fatigue, palpitations, melena, hematuria, hemoptysis, diaphoresis, weakness, presyncope, syncope, orthopnea, and PND. ? ? ?Home Medications  ?  ?Prior to Admission medications   ?Medication Sig Start Date End Date Taking? Authorizing Provider  ?aspirin EC 81 MG EC tablet Take 1 tablet (81 mg total) by mouth daily. 05/31/13   Isaiah Serge, NP  ?atorvastatin (LIPITOR) 80 MG tablet TAKE 1 TABLET BY MOUTH ONCE DAILY AT  Amede[facility] Plenty 12/02/21   Minus Breeding, MD  ?glimepiride (AMARYL) 2 MG tablet  Take 2 mg by mouth daily with breakfast.    [provider]  ?metFORMIN (GLUCOPHAGE) 1000 MG tablet Take 1,000 mg by mouth 2 (two) times daily with a meal.    [provider]  ?metoprolol tartrate (LOPRESSOR) 25 MG tablet Take 1/2 (one-half) tablet by mouth twice daily 11/19/21   Minus Breeding, MD  ?pantoprazole (PROTONIX) 40 MG tablet Take 1 tablet by mouth once daily 10/01/21   Minus Breeding, MD  ? ? ?Family History  ?  ?Family History  ?Problem Relation  Age of Onset  ? Healthy Mother   ?     NO CARDIAC RELATED HEALTH ISSUES   ? Healthy Father   ?     NO CARDIAC RELATED HEALTH ISSUES   ? Stroke Father   ? Healthy Sister   ?     NO CARDIAC RELATED HEALTH ISSUES   ? Heart attack Neg Hx   ? Hypertension Neg Hx   ? ?He indicated that his mother is deceased. He indicated that his father is deceased. He indicated that only one of his two sisters is alive. He indicated that the status of his neg hx is unknown. ? ?Social History  ?  ?Social History  ? ?Socioeconomic History  ? Marital status: Married  ?  Spouse name: Not on file  ? Number of children: Not on file  ? Years of education: Not on file  ? Highest education level: Not on file  ?Occupational History  ? Not on file  ?Tobacco Use  ? Smoking status: Never  ? Smokeless tobacco: Never  ?Substance and Sexual Activity  ? Alcohol use: Yes  ?  Alcohol/week: 1.0 standard drink  ?  Types: 1 Shots of liquor per week  ? Drug use: No  ? Sexual activity: Not on file  ?Other Topics Concern  ? Not on file  ?Social History Narrative  ? Not on file  ? ?Social Determinants of Health  ? ?Financial Resource Strain: Not on file  ?Food Insecurity: Not on file  ?Transportation Needs: Not on file  ?Physical Activity: Not on file  ?Stress: Not on file  ?Social Connections: Not on file  ?Intimate Partner Violence: Not on file  ?  ? ?Review of Systems  ?  ?General:  No chills, fever, night sweats or weight changes.  ?Cardiovascular:  No chest pain, dyspnea on exertion, edema, orthopnea, palpitations, paroxysmal nocturnal dyspnea. ?Dermatological: No rash, lesions/masses ?Respiratory: No cough, dyspnea ?Urologic: No hematuria, dysuria ?Abdominal:   No nausea, vomiting, diarrhea, bright red blood per rectum, melena, or hematemesis ?Neurologic:  No visual changes, wkns, changes in mental status. ?All other systems reviewed and are otherwise negative except as noted above. ? ?Physical Exam  ?  ?VS:  BP 134/62   Pulse 74   Ht '5\' 5"'$  (1.651 m)    Wt 134 lb 6.4 oz (61 kg)   SpO2 95%   BMI 22.37 kg/m?  , BMI Body mass index is 22.37 kg/m?. ?GEN: Well nourished, well developed, in no acute distress. ?HEENT: normal. ?Neck: Supple, no JVD, carotid bruits, or masses. ?Cardiac: RRR, no murmurs, rubs, or gallops. No clubbing, cyanosis, edema.  Radials/DP/PT 2+ and equal bilaterally.  ?Respiratory:  Respirations regular and unlabored, clear to auscultation bilaterally. ?GI: Soft, nontender, nondistended, BS + x 4. ?MS: no deformity or atrophy. ?Skin: warm and dry, no rash. ?Neuro:  Strength and sensation are intact. ?Psych: Normal affect. ? ?Accessory Clinical Findings  ?  ?Recent Labs: ?11/19/2021: BUN 15;  Creatinine, Ser 1.29; Hemoglobin 12.9; Platelets 215; Potassium 5.2; Sodium 144; TSH 3.040  ? ?Recent Lipid Panel ?   ?Component Value Date/Time  ? CHOL 101 11/19/2021 1648  ? TRIG 123 11/19/2021 1648  ? HDL 38 (L) 11/19/2021 1648  ? CHOLHDL 2.7 11/19/2021 1648  ? CHOLHDL 2.7 12/07/2014 1140  ? VLDL 15 12/07/2014 1140  ? [place] 41 11/19/2021 1648  ? ? ?ECG personally reviewed by me today-none today. ? ?EKG 11/19/2021 ?Sinus rhythm, LBBB, 67 bpm, no acute changes ? ?Echocardiogram 07/09/2013 ?Study Conclusions  ? ?- Procedure narrative: Transthoracic echocardiography. Image  ?  quality was suboptimal. The study was technically  ?  difficult, as a result of poor sound wave transmission,  ?  surgical dressings, and excessive abdominal air.  ?- Left ventricle: The cavity size was normal. Wall thickness  ?  was increased in a pattern of mild LVH. Systolic function  ?  was normal. The estimated ejection fraction was in the  ?  range of 55% to 60%. Incoordinate post-operative and  ?  LBBB-relatedseptal motion. Doppler parameters are  ?  consistent with abnormal left ventricular relaxation  ?  (grade 1 diastolic dysfunction). The E/e' ratio is <10,  ?  suggesting normal LV filling pressure.  ?- Aortic valve: Poorly visualized. Sclerosis without  ?  stenosis. No  regurgitation.  ?- Mitral valve: Calcified annulus. Leaflet separation was  ?  normal. Trivial regurgitation.  ?- Left atrium: The atrium was at the upper limits of normal  ?  in size. ? ?Assessment & Plan  ? ?1.  Cor

## 2022-02-14 ENCOUNTER — Other Ambulatory Visit: Payer: Self-pay | Admitting: Cardiology

## 2022-02-17 ENCOUNTER — Ambulatory Visit (INDEPENDENT_AMBULATORY_CARE_PROVIDER_SITE_OTHER): Payer: Medicare Other | Admitting: General Practice

## 2022-02-17 ENCOUNTER — Encounter: Payer: Self-pay | Admitting: General Practice

## 2022-02-17 VITALS — BP 134/62 | HR 74 | Ht 65.0 in | Wt 134.4 lb

## 2022-02-17 DIAGNOSIS — R002 Palpitations: Secondary | ICD-10-CM

## 2022-02-17 DIAGNOSIS — I447 Left bundle-branch block, unspecified: Secondary | ICD-10-CM

## 2022-02-17 DIAGNOSIS — I10 Essential (primary) hypertension: Secondary | ICD-10-CM

## 2022-02-17 DIAGNOSIS — E785 Hyperlipidemia, unspecified: Secondary | ICD-10-CM

## 2022-02-17 DIAGNOSIS — I252 Old myocardial infarction: Secondary | ICD-10-CM

## 2022-02-17 DIAGNOSIS — I2510 Atherosclerotic heart disease of native coronary artery without angina pectoris: Secondary | ICD-10-CM

## 2022-02-17 DIAGNOSIS — E118 Type 2 diabetes mellitus with unspecified complications: Secondary | ICD-10-CM

## 2022-02-17 NOTE — Patient Instructions (Addendum)
Medication Instructions:  ?The current medical regimen is effective;  continue present plan and medications as directed. Please refer to the Current Medication list given to you today.  ? ?*If you need a refill on your cardiac medications before your next appointment, please call your pharmacy* ? ?Lab Work:   Testing/Procedures:  ?BMET TODAY   NONE ? ?If you have labs (blood work) drawn today and your tests are completely normal, you will receive your results only by: ?MyChart Message (if you have MyChart) OR  A paper copy in the mail ?If you have any lab test that is abnormal or we need to change your treatment, we will call you to review the results. ? ?Special Instructions ?PLEASE INCREASE PHYSICAL ACTIVITY AS TOLERATED  ? ?Follow-Up: ?Your next appointment:  6-9 month(s) In Person with Minus Breeding, MD  08-19-2022 at [place] ? ?Please call our office 2 months in advance to schedule this appointment  :1 ? ?At [facility], you and your health needs are our priority.  As part of our continuing mission to provide you with exceptional heart care, we have created designated Provider Care Teams.  These Care Teams include your primary Cardiologist (physician) and Advanced Practice Providers (APPs -  Physician Assistants and Nurse Practitioners) who all work together to provide you with the care you need, when you need it. ? ?We recommend signing up for the patient portal called "MyChart".  Sign up information is provided on this After Visit Summary.  MyChart is used to connect with patients for Virtual Visits (Telemedicine).  Patients are able to view lab/test results, encounter notes, upcoming appointments, etc.  Non-urgent messages can be sent to your provider as well.   ?To learn more about what you can do with MyChart, go to [email or web address].   ? ? ?Important Information About Sugar ? ? ? ? ? ? ?       ?

## 2022-02-18 LAB — BASIC METABOLIC PANEL
BUN/Creatinine Ratio: 11 (ref 10–24)
BUN: 15 mg/dL (ref 8–27)
CO2: 21 mmol/L (ref 20–29)
Calcium: 9 mg/dL (ref 8.6–10.2)
Chloride: 103 mmol/L (ref 96–106)
Creatinine, Ser: 1.33 mg/dL — ABNORMAL HIGH (ref 0.76–1.27)
Glucose: 280 mg/dL — ABNORMAL HIGH (ref 70–99)
Potassium: 5.2 mmol/L (ref 3.5–5.2)
Sodium: 145 mmol/L — ABNORMAL HIGH (ref 134–144)
eGFR: 53 mL/min/{1.73_m2} — ABNORMAL LOW (ref >59)

## 2022-03-01 ENCOUNTER — Other Ambulatory Visit: Payer: Self-pay | Admitting: Cardiology

## 2022-05-23 ENCOUNTER — Other Ambulatory Visit: Payer: Self-pay | Admitting: Cardiology

## 2022-07-03 DIAGNOSIS — T1490XA Injury, unspecified, initial encounter: Secondary | ICD-10-CM | POA: Insufficient documentation

## 2022-07-07 DIAGNOSIS — W19XXXA Unspecified fall, initial encounter: Secondary | ICD-10-CM | POA: Insufficient documentation

## 2022-07-07 DIAGNOSIS — S2241XA Multiple fractures of ribs, right side, initial encounter for closed fracture: Secondary | ICD-10-CM | POA: Insufficient documentation

## 2022-08-16 NOTE — Progress Notes (Deleted)
Cardiology Office Note   Date:  08/16/2022   ID:  Steve, Hickman 22-Jul-1941, MRN [id number]  PCP:  Steve Roers, MD  Cardiologist:   Minus Breeding, MD   No chief complaint on file.     History of Present Illness: Steve Hickman is a 81 y.o. male who presents for follow up of CAD.   He had an inferior STEMI 05/28/13.  He was seen and evaluated and taken emergently to the cath lab. He underwent PCI with BMS to proximal codominant LCX. He had residual disease and later underwent elective CABG X 2 with LIMA-LAD, SVG-PD on 07/07/13 by Dr Roxan Hockey. Post op he had PAF briefly treated with Amiodarone, he has not had recurrence. Echo 07/09/13 showed normal LVF-EF 55-60%.     Since I last saw him ***   ***  he has not had any chest discomfort, neck or arm discomfort but he does not recall these as significant symptoms previously.  He does get dizzy.  Some of this is with changing positions.  He notices this when he sits up or bends over to pick something up.  He also notices at times and he sitting still and he just looks up.  He does not describe his heart racing or skipping except for very rare palpitations.  He has not had any frank syncope.  He has had no new shortness of breath, PND or orthopnea   Past Medical History:  Diagnosis Date   CAD s/p CABG x 2 (LIMA-LAD, SV-PDA) 07/07/13 (EF 60%)  05/29/2013   Diabetes mellitus ([place]) 05/31/2013   Dyslipidemia, goal LDL below 70 05/31/2013   Left bundle branch block    STEMI (ST elevation myocardial infarction)- CFX BMS, 05/28/2013 05/29/2013   STEMI     Past Surgical History:  Procedure Laterality Date   CORONARY ARTERY BYPASS GRAFT N/A 07/07/2013   Procedure: CORONARY ARTERY BYPASS GRAFTING (CABG);  Surgeon: Melrose Nakayama, MD;  Location: [facility];  Service: Open Heart Surgery;  Laterality: N/A;  CABG X 2   LEFT HEART CATH N/A 05/28/2013   Procedure: LEFT HEART CATH;  Surgeon: Lorretta Harp, MD;  Location: [facility] CATH LAB;  Service:  Cardiovascular;  Laterality: N/A;   NO PAST SURGERIES     PERCUTANEOUS CORONARY STENT INTERVENTION (PCI-S)  05/28/2013   Procedure: PERCUTANEOUS CORONARY STENT INTERVENTION (PCI-S);  Surgeon: Lorretta Harp, MD;  Location: [facility] CATH LAB;  Service: Cardiovascular;;  prox cx     Current Outpatient Medications  Medication Sig Dispense Refill   aspirin EC 81 MG EC tablet Take 1 tablet (81 mg total) by mouth daily.     atorvastatin (LIPITOR) 80 MG tablet TAKE 1 TABLET BY MOUTH ONCE DAILY AT  6  PM 90 tablet 3   glimepiride (AMARYL) 2 MG tablet Take 2 mg by mouth daily with breakfast.     metFORMIN (GLUCOPHAGE) 1000 MG tablet Take 1,000 mg by mouth 2 (two) times daily with a meal.     metoprolol tartrate (LOPRESSOR) 25 MG tablet Take 1/2 (one-half) tablet by mouth twice daily 90 tablet 3   pantoprazole (PROTONIX) 40 MG tablet Take 1 tablet by mouth once daily 90 tablet 3   No current facility-administered medications for this visit.    Allergies:   Patient has no known allergies.    ROS:  Please see the history of present illness.   Otherwise, review of systems are positive for ***.   All other systems  are reviewed and negative.    PHYSICAL EXAM: VS:  There were no vitals taken for this visit. , BMI There is no height or weight on file to calculate BMI. GENERAL:  Well appearing NECK:  No jugular venous distention, waveform within normal limits, carotid upstroke brisk and symmetric, no bruits, no thyromegaly LUNGS:  Clear to auscultation bilaterally CHEST:  Well healed sternotomy scar. HEART:  PMI not displaced or sustained,S1 and S2 within normal limits, no S3, no S4, no clicks, no rubs, *** murmurs ABD:  Flat, positive bowel sounds normal in frequency in pitch, no bruits, no rebound, no guarding, no midline pulsatile mass, no hepatomegaly, no splenomegaly EXT:  2 plus pulses throughout, no edema, no cyanosis no clubbing     ***GENERAL:  Well appearing NECK:  No jugular venous  distention, waveform within normal limits, carotid upstroke brisk and symmetric, no bruits, no thyromegaly LUNGS:  Clear to auscultation bilaterally CHEST:  Well healed sternotomy scar. HEART:  PMI not displaced or sustained,S1 and S2 within normal limits, no S3, no S4, no clicks, no rubs, very brief soft apical systolic murmur, no radiating at the aortic outflow tract, no diastolic murmurs ABD:  Flat, positive bowel sounds normal in frequency in pitch, no bruits, no rebound, no guarding, no midline pulsatile mass, no hepatomegaly, no splenomegaly EXT:  2 plus pulses throughout, no edema, no cyanosis no clubbing    EKG:  EKG is *** ordered today. The ekg ordered  demonstrates sinus rhythm, left bundle branch block, rate ***, no significant change from previous..  Intermittent left bundle branch block with 1 narrow complex beats identified.   Recent Labs: 11/19/2021: Hemoglobin 12.9; Platelets 215; TSH 3.040 02/17/2022: BUN 15; Creatinine, Ser 1.33; Potassium 5.2; Sodium 145    Lipid Panel    Component Value Date/Time   CHOL 101 11/19/2021 1648   TRIG 123 11/19/2021 1648   HDL 38 (L) 11/19/2021 1648   CHOLHDL 2.7 11/19/2021 1648   CHOLHDL 2.7 12/07/2014 1140   VLDL 15 12/07/2014 1140   LDLCALC 41 11/19/2021 1648      Wt Readings from Last 3 Encounters:  02/17/22 134 lb 6.4 oz (61 kg)  11/19/21 136 lb (61.7 kg)  12/21/20 135 lb 3.2 oz (61.3 kg)      Other studies Reviewed: Additional studies/ records that were reviewed today include: ***. Review of the above records demonstrates:  Please see elsewhere in the note.     ASSESSMENT AND PLAN:  Hx of CABG *** Is not having any anginal symptoms.  I am not suspecting ischemia.   I will continue with aggressive risk reduction.   LBBB (left bundle branch block) This is *** chronic.  However, given this and the dizziness I will check a 3-day monitor.   Dyslipidemia ***  I will be checking a lipid profile even though he is not  fasting today.  HTN His blood pressure is *** elevated and I am going to give him a blood pressure cuff with instructions on how to take his blood pressures.  I do not want to titrate his meds until he had further readings given his dizziness.   DM ***  I will check an A1c.   DIZZINESS:   ***  Will evaluate with the monitor as above.  Pupils equally orthostatic although he had a slight drop in his standing blood pressure and I suspect this may be the etiology of his dizziness.  Current medicines are reviewed at length with the patient today.  The patient does not have concerns regarding medicines.  The following changes have been made:  ***  Labs/ tests ordered today include:  ***  No orders of the defined types were placed in this encounter.   Disposition:   FU with me with me in *** months.     Signed, Minus Breeding, MD  08/16/2022 9:16 PM    [place]

## 2022-08-19 ENCOUNTER — Ambulatory Visit: Payer: Medicare Other | Attending: Cardiology | Admitting: Cardiology

## 2022-08-19 DIAGNOSIS — E785 Hyperlipidemia, unspecified: Secondary | ICD-10-CM

## 2022-08-19 DIAGNOSIS — I447 Left bundle-branch block, unspecified: Secondary | ICD-10-CM

## 2022-08-19 DIAGNOSIS — I2510 Atherosclerotic heart disease of native coronary artery without angina pectoris: Secondary | ICD-10-CM

## 2022-08-19 DIAGNOSIS — E118 Type 2 diabetes mellitus with unspecified complications: Secondary | ICD-10-CM

## 2022-10-16 ENCOUNTER — Other Ambulatory Visit: Payer: Self-pay | Admitting: Cardiology

## 2022-10-20 DIAGNOSIS — E119 Type 2 diabetes mellitus without complications: Secondary | ICD-10-CM | POA: Insufficient documentation

## 2022-11-21 DIAGNOSIS — K219 Gastro-esophageal reflux disease without esophagitis: Secondary | ICD-10-CM | POA: Insufficient documentation

## 2023-01-14 ENCOUNTER — Other Ambulatory Visit: Payer: Self-pay | Admitting: Cardiology

## 2023-02-16 ENCOUNTER — Other Ambulatory Visit: Payer: Self-pay | Admitting: Cardiology

## 2023-03-11 ENCOUNTER — Other Ambulatory Visit: Payer: Self-pay | Admitting: Cardiology

## 2023-03-13 ENCOUNTER — Telehealth: Payer: Self-pay | Admitting: Cardiology

## 2023-03-13 NOTE — Telephone Encounter (Signed)
*  STAT* If patient is at the pharmacy, call can be transferred to refill team.   1. Which medications need to be refilled? (please list name of each medication and dose if known)   atorvastatin (LIPITOR) 80 MG tablet   2. Which pharmacy/location (including street and city if local pharmacy) is medication to be sent to?  [id number] - [place], [place] - [address]   3. Do they need a 30 day or 90 day supply?   90 day  Patient stated he is almost out of this medication.

## 2023-03-18 ENCOUNTER — Other Ambulatory Visit: Payer: Self-pay | Admitting: Cardiology

## 2023-04-03 ENCOUNTER — Other Ambulatory Visit: Payer: Self-pay | Admitting: Cardiology

## 2023-06-09 ENCOUNTER — Other Ambulatory Visit: Payer: Self-pay | Admitting: Cardiology

## 2023-07-14 DIAGNOSIS — I10 Essential (primary) hypertension: Secondary | ICD-10-CM

## 2023-07-14 HISTORY — DX: Essential (primary) hypertension: I10

## 2023-07-14 NOTE — Progress Notes (Unsigned)
Cardiology Office Note:   Date:  07/15/2023  ID:  Steve Hickman, DOB 06/21/1941, MRN [id number] PCP: Philemon Kingdom, MD   HeartCare Providers Cardiologist:  Rollene Rotunda, MD {  History of Present Illness:   Steve Hickman is a 82 y.o. male who presents for follow up of CAD.   He had an inferior STEMI 05/28/13.  He was seen and evaluated and taken emergently to the cath lab. He underwent PCI with BMS to proximal codominant LCX. He had residual disease and later underwent elective CABG X 2 with LIMA-LAD, SVG-PD on 07/07/13 by Dr Dorris Fetch. Post op he had PAF briefly treated with Amiodarone, he has not had recurrence. Echo 07/09/13 showed normal LVF-EF 55-60%.     Since I last saw him he has done okay.  He lives by himself.  He is not particularly active though he carries in his own groceries up 3 or 4 stairs. The patient denies any new symptoms such as chest discomfort, neck or arm discomfort. There has been no new shortness of breath, PND or orthopnea. There have been no reported palpitations, presyncope or syncope.   He may get a little dizzy when he gets up in the morning.  This does not seem to be as problematic as it was before.  ROS: Disturbed sleep pattern with daytime somnolence and nighttime insomnia  Otherwise as stated in the HPI and negative for all other systems.  Studies Reviewed:    EKG:   EKG Interpretation Date/Time:  Wednesday July 15 2023 15:38:14 EDT Ventricular Rate:  75 PR Interval:  196 QRS Duration:  128 QT Interval:  414 QTC Calculation: 462 R Axis:   -36  Text Interpretation: Sinus rhythm with Premature atrial complexes Left axis deviation Left bundle branch block When compared with ECG of 11-Jul-2013 09:42, No significant change since last tracing Confirmed by Rollene Rotunda ([id number]) on 07/15/2023 3:53:07 PM    Risk Assessment/Calculations:        Physical Exam:   VS:  BP 134/88 (BP Location: Left Arm, Patient Position: Sitting, Cuff  Size: Normal)   Pulse 77   Ht 5\' 5"  (1.651 m)   Wt 138 lb 12.8 oz (63 kg)   SpO2 95%   BMI 23.10 kg/m    Wt Readings from Last 3 Encounters:  07/15/23 138 lb 12.8 oz (63 kg)  02/17/22 134 lb 6.4 oz (61 kg)  11/19/21 136 lb (61.7 kg)     GEN: Well nourished, well developed in no acute distress NECK: No JVD; No carotid bruits CARDIAC: RRR, 2 out of 6 apical systolic murmur radiating at the aortic outflow tract and early peaking, no diastolic murmurs, rubs, gallops RESPIRATORY:  Clear to auscultation without rales, wheezing or rhonchi  ABDOMEN: Soft, non-tender, non-distended EXTREMITIES:  No edema; No deformity   ASSESSMENT AND PLAN:   Hx of CABG:  The patient has no new sypmtoms.  No further cardiovascular testing is indicated.  We will continue with aggressive risk reduction and meds as listed.   LBBB (left bundle branch block): This has been chronic.  No change in therapy.   Dyslipidemia: LDL was 37 in May.  No change in therapy.  HTN: His blood pressure is well-controlled.  He will continue the meds as listed.  DM: A1c was 8.0.  He needs to follow-up with his primary.  We talked about the fact that he does eat too much sugar.  He is going to check with his primary as to whether he  supposed to be on glimepiride because he does not think he is taking this.  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   DIZZINESS: This was mild.  No change in therapy.  HYPERKALEMIA: I see this on the most recent labs he had done by his primary provider and I will repeat this.  He also has some renal insufficiency       Follow up with me in one year.   Signed, Rollene Rotunda, MD

## 2023-07-15 ENCOUNTER — Ambulatory Visit: Payer: 59 | Attending: Cardiology | Admitting: Cardiology

## 2023-07-15 ENCOUNTER — Encounter: Payer: Self-pay | Admitting: Cardiology

## 2023-07-15 VITALS — BP 134/88 | HR 77 | Ht 65.0 in | Wt 138.8 lb

## 2023-07-15 DIAGNOSIS — E118 Type 2 diabetes mellitus with unspecified complications: Secondary | ICD-10-CM

## 2023-07-15 DIAGNOSIS — I252 Old myocardial infarction: Secondary | ICD-10-CM

## 2023-07-15 DIAGNOSIS — I10 Essential (primary) hypertension: Secondary | ICD-10-CM | POA: Diagnosis not present

## 2023-07-15 DIAGNOSIS — I2510 Atherosclerotic heart disease of native coronary artery without angina pectoris: Secondary | ICD-10-CM

## 2023-07-15 DIAGNOSIS — E785 Hyperlipidemia, unspecified: Secondary | ICD-10-CM | POA: Diagnosis not present

## 2023-07-15 DIAGNOSIS — R011 Cardiac murmur, unspecified: Secondary | ICD-10-CM

## 2023-07-15 NOTE — Patient Instructions (Signed)
Medication Instructions:  NO CHANGES  *If you need a refill on your cardiac medications before your next appointment, please call your pharmacy*   Lab Work: BMET today   If you have labs (blood work) drawn today and your tests are completely normal, you will receive your results only by: MyChart Message (if you have MyChart) OR A paper copy in the mail If you have any lab test that is abnormal or we need to change your treatment, we will call you to review the results.   Testing/Procedures: Your physician has requested that you have an echocardiogram. Echocardiography is a painless test that uses sound waves to create images of your heart. It provides your doctor with information about the size and shape of your heart and how well your heart's chambers and valves are working. This procedure takes approximately one hour. There are no restrictions for this procedure. Please do NOT wear cologne, perfume, aftershave, or lotions (deodorant is allowed). Please arrive 15 minutes prior to your appointment time.    Follow-Up: At [facility], you and your health needs are our priority.  As part of our continuing mission to provide you with exceptional heart care, we have created designated Provider Care Teams.  These Care Teams include your primary Cardiologist (physician) and Advanced Practice Providers (APPs -  Physician Assistants and Nurse Practitioners) who all work together to provide you with the care you need, when you need it.  We recommend signing up for the patient portal called "MyChart".  Sign up information is provided on this After Visit Summary.  MyChart is used to connect with patients for Virtual Visits (Telemedicine).  Patients are able to view lab/test results, encounter notes, upcoming appointments, etc.  Non-urgent messages can be sent to your provider as well.   To learn more about what you can do with MyChart, go to [email or web address].    Your next  appointment:   12 months with Dr. Antoine Poche

## 2023-07-16 LAB — BASIC METABOLIC PANEL
BUN/Creatinine Ratio: 10 (ref 10–24)
BUN: 16 mg/dL (ref 8–27)
CO2: 22 mmol/L (ref 20–29)
Calcium: 9.2 mg/dL (ref 8.6–10.2)
Chloride: 103 mmol/L (ref 96–106)
Creatinine, Ser: 1.57 mg/dL — ABNORMAL HIGH (ref 0.76–1.27)
Glucose: 234 mg/dL — ABNORMAL HIGH (ref 70–99)
Potassium: 5.1 mmol/L (ref 3.5–5.2)
Sodium: 140 mmol/L (ref 134–144)
eGFR: 43 mL/min/{1.73_m2} — ABNORMAL LOW (ref >59)

## 2023-07-20 ENCOUNTER — Telehealth: Payer: Self-pay | Admitting: *Deleted

## 2023-07-20 ENCOUNTER — Encounter: Payer: Self-pay | Admitting: *Deleted

## 2023-07-20 DIAGNOSIS — N289 Disorder of kidney and ureter, unspecified: Secondary | ICD-10-CM

## 2023-07-20 NOTE — Telephone Encounter (Signed)
Letter of lab results sent to pt  Lab orders mailed to the pt

## 2023-07-20 NOTE — Telephone Encounter (Signed)
-----   Message from Rollene Rotunda sent at 07/17/2023  4:38 PM EDT ----- The potassium was OK.  Creat is elevated.  It is trending up.  Repeat BMET in one month.  No change in therapy.  Call Mr. Nakanishi with the results and send results to Philemon Kingdom, MD

## 2023-07-31 ENCOUNTER — Other Ambulatory Visit: Payer: Self-pay | Admitting: Cardiology

## 2023-08-09 ENCOUNTER — Other Ambulatory Visit: Payer: Self-pay | Admitting: Cardiology

## 2023-08-17 ENCOUNTER — Ambulatory Visit: Payer: 59 | Attending: Cardiology

## 2023-08-17 DIAGNOSIS — R011 Cardiac murmur, unspecified: Secondary | ICD-10-CM

## 2023-08-17 LAB — ECHOCARDIOGRAM COMPLETE
AR max vel: 1.52 cm2
AV Area VTI: 1.61 cm2
AV Area mean vel: 1.48 cm2
AV Mean grad: 9.4 mm[Hg]
AV Peak grad: 16.7 mm[Hg]
Ao pk vel: 2.04 m/s
S' Lateral: 2.4 cm

## 2023-08-24 ENCOUNTER — Encounter: Payer: Self-pay | Admitting: *Deleted

## 2023-08-26 ENCOUNTER — Other Ambulatory Visit: Payer: Self-pay | Admitting: Cardiology

## 2023-09-01 ENCOUNTER — Encounter: Payer: Self-pay | Admitting: *Deleted

## 2023-09-02 ENCOUNTER — Other Ambulatory Visit: Payer: Self-pay | Admitting: Cardiology

## 2023-09-10 LAB — BASIC METABOLIC PANEL
BUN/Creatinine Ratio: 10 (ref 10–24)
BUN: 15 mg/dL (ref 8–27)
CO2: 21 mmol/L (ref 20–29)
Calcium: 9.5 mg/dL (ref 8.6–10.2)
Chloride: 106 mmol/L (ref 96–106)
Creatinine, Ser: 1.47 mg/dL — ABNORMAL HIGH (ref 0.76–1.27)
Glucose: 217 mg/dL — ABNORMAL HIGH (ref 70–99)
Potassium: 5.5 mmol/L — ABNORMAL HIGH (ref 3.5–5.2)
Sodium: 142 mmol/L (ref 134–144)
eGFR: 47 mL/min/{1.73_m2} — ABNORMAL LOW (ref >59)

## 2023-11-16 ENCOUNTER — Emergency Department (HOSPITAL_COMMUNITY): Payer: 59

## 2023-11-16 ENCOUNTER — Inpatient Hospital Stay (HOSPITAL_COMMUNITY)
Admission: EM | Admit: 2023-11-16 | Discharge: 2023-11-27 | DRG: 064 | Disposition: A | Discharge status: Skilled Nursing Facility | Payer: 59 | Attending: Internal Medicine | Admitting: Internal Medicine

## 2023-11-16 DIAGNOSIS — N1831 Chronic kidney disease, stage 3a: Secondary | ICD-10-CM | POA: Diagnosis present

## 2023-11-16 DIAGNOSIS — R4182 Altered mental status, unspecified: Secondary | ICD-10-CM | POA: Diagnosis present

## 2023-11-16 DIAGNOSIS — Z79899 Other long term (current) drug therapy: Secondary | ICD-10-CM

## 2023-11-16 DIAGNOSIS — R1310 Dysphagia, unspecified: Secondary | ICD-10-CM | POA: Diagnosis present

## 2023-11-16 DIAGNOSIS — E1165 Type 2 diabetes mellitus with hyperglycemia: Secondary | ICD-10-CM | POA: Diagnosis present

## 2023-11-16 DIAGNOSIS — G460 Middle cerebral artery syndrome: Secondary | ICD-10-CM | POA: Diagnosis present

## 2023-11-16 DIAGNOSIS — I4819 Other persistent atrial fibrillation: Secondary | ICD-10-CM

## 2023-11-16 DIAGNOSIS — E876 Hypokalemia: Secondary | ICD-10-CM | POA: Diagnosis present

## 2023-11-16 DIAGNOSIS — R531 Weakness: Secondary | ICD-10-CM | POA: Diagnosis not present

## 2023-11-16 DIAGNOSIS — I63522 Cerebral infarction due to unspecified occlusion or stenosis of left anterior cerebral artery: Secondary | ICD-10-CM | POA: Diagnosis present

## 2023-11-16 DIAGNOSIS — I252 Old myocardial infarction: Secondary | ICD-10-CM

## 2023-11-16 DIAGNOSIS — I129 Hypertensive chronic kidney disease with stage 1 through stage 4 chronic kidney disease, or unspecified chronic kidney disease: Secondary | ICD-10-CM | POA: Diagnosis present

## 2023-11-16 DIAGNOSIS — Z7982 Long term (current) use of aspirin: Secondary | ICD-10-CM

## 2023-11-16 DIAGNOSIS — Z955 Presence of coronary angioplasty implant and graft: Secondary | ICD-10-CM

## 2023-11-16 DIAGNOSIS — R64 Cachexia: Secondary | ICD-10-CM | POA: Diagnosis present

## 2023-11-16 DIAGNOSIS — G9341 Metabolic encephalopathy: Secondary | ICD-10-CM | POA: Diagnosis present

## 2023-11-16 DIAGNOSIS — Z951 Presence of aortocoronary bypass graft: Secondary | ICD-10-CM

## 2023-11-16 DIAGNOSIS — J9601 Acute respiratory failure with hypoxia: Secondary | ICD-10-CM | POA: Diagnosis not present

## 2023-11-16 DIAGNOSIS — G9340 Encephalopathy, unspecified: Secondary | ICD-10-CM

## 2023-11-16 DIAGNOSIS — R627 Adult failure to thrive: Secondary | ICD-10-CM | POA: Diagnosis present

## 2023-11-16 DIAGNOSIS — I442 Atrioventricular block, complete: Secondary | ICD-10-CM | POA: Diagnosis present

## 2023-11-16 DIAGNOSIS — K5900 Constipation, unspecified: Secondary | ICD-10-CM | POA: Diagnosis present

## 2023-11-16 DIAGNOSIS — I214 Non-ST elevation (NSTEMI) myocardial infarction: Secondary | ICD-10-CM | POA: Insufficient documentation

## 2023-11-16 DIAGNOSIS — Z7902 Long term (current) use of antithrombotics/antiplatelets: Secondary | ICD-10-CM

## 2023-11-16 DIAGNOSIS — I639 Cerebral infarction, unspecified: Secondary | ICD-10-CM | POA: Diagnosis present

## 2023-11-16 DIAGNOSIS — E1122 Type 2 diabetes mellitus with diabetic chronic kidney disease: Secondary | ICD-10-CM | POA: Diagnosis present

## 2023-11-16 DIAGNOSIS — N170 Acute kidney failure with tubular necrosis: Secondary | ICD-10-CM | POA: Diagnosis present

## 2023-11-16 DIAGNOSIS — E875 Hyperkalemia: Secondary | ICD-10-CM | POA: Diagnosis present

## 2023-11-16 DIAGNOSIS — M6282 Rhabdomyolysis: Secondary | ICD-10-CM | POA: Diagnosis present

## 2023-11-16 DIAGNOSIS — I63512 Cerebral infarction due to unspecified occlusion or stenosis of left middle cerebral artery: Secondary | ICD-10-CM | POA: Diagnosis not present

## 2023-11-16 DIAGNOSIS — R29727 NIHSS score 27: Secondary | ICD-10-CM | POA: Diagnosis present

## 2023-11-16 DIAGNOSIS — I2510 Atherosclerotic heart disease of native coronary artery without angina pectoris: Secondary | ICD-10-CM | POA: Diagnosis present

## 2023-11-16 DIAGNOSIS — E860 Dehydration: Secondary | ICD-10-CM | POA: Diagnosis present

## 2023-11-16 DIAGNOSIS — D6489 Other specified anemias: Secondary | ICD-10-CM | POA: Diagnosis present

## 2023-11-16 DIAGNOSIS — I959 Hypotension, unspecified: Secondary | ICD-10-CM | POA: Diagnosis present

## 2023-11-16 DIAGNOSIS — I2489 Other forms of acute ischemic heart disease: Secondary | ICD-10-CM

## 2023-11-16 DIAGNOSIS — Z515 Encounter for palliative care: Secondary | ICD-10-CM

## 2023-11-16 DIAGNOSIS — R404 Transient alteration of awareness: Principal | ICD-10-CM

## 2023-11-16 DIAGNOSIS — Z66 Do not resuscitate: Secondary | ICD-10-CM | POA: Diagnosis not present

## 2023-11-16 DIAGNOSIS — Z823 Family history of stroke: Secondary | ICD-10-CM

## 2023-11-16 DIAGNOSIS — N179 Acute kidney failure, unspecified: Secondary | ICD-10-CM

## 2023-11-16 DIAGNOSIS — G8191 Hemiplegia, unspecified affecting right dominant side: Secondary | ICD-10-CM | POA: Diagnosis present

## 2023-11-16 DIAGNOSIS — Z7984 Long term (current) use of oral hypoglycemic drugs: Secondary | ICD-10-CM

## 2023-11-16 DIAGNOSIS — E538 Deficiency of other specified B group vitamins: Secondary | ICD-10-CM | POA: Diagnosis present

## 2023-11-16 DIAGNOSIS — E872 Acidosis, unspecified: Secondary | ICD-10-CM | POA: Diagnosis present

## 2023-11-16 DIAGNOSIS — E7800 Pure hypercholesterolemia, unspecified: Secondary | ICD-10-CM | POA: Diagnosis present

## 2023-11-16 DIAGNOSIS — E870 Hyperosmolality and hypernatremia: Secondary | ICD-10-CM | POA: Diagnosis present

## 2023-11-16 DIAGNOSIS — I213 ST elevation (STEMI) myocardial infarction of unspecified site: Secondary | ICD-10-CM | POA: Insufficient documentation

## 2023-11-16 DIAGNOSIS — I21A1 Myocardial infarction type 2: Secondary | ICD-10-CM | POA: Diagnosis present

## 2023-11-16 LAB — BASIC METABOLIC PANEL
Anion gap: 25 — ABNORMAL HIGH (ref 5–15)
BUN: 41 mg/dL — ABNORMAL HIGH (ref 8–23)
CO2: 10 mmol/L — ABNORMAL LOW (ref 22–32)
Calcium: 8.5 mg/dL — ABNORMAL LOW (ref 8.9–10.3)
Chloride: 111 mmol/L (ref 98–111)
Creatinine, Ser: 2.86 mg/dL — ABNORMAL HIGH (ref 0.61–1.24)
GFR, Estimated: 21 mL/min — ABNORMAL LOW (ref >60)
Glucose, Bld: 170 mg/dL — ABNORMAL HIGH (ref 70–99)
Potassium: 6.1 mmol/L — ABNORMAL HIGH (ref 3.5–5.1)
Sodium: 146 mmol/L — ABNORMAL HIGH (ref 135–145)

## 2023-11-16 LAB — CBC WITH DIFFERENTIAL/PLATELET
Abs Immature Granulocytes: 0.14 10*3/uL — ABNORMAL HIGH (ref 0.00–0.07)
Basophils Absolute: 0 10*3/uL (ref 0.0–0.1)
Basophils Relative: 0 %
Eosinophils Absolute: 0 10*3/uL (ref 0.0–0.5)
Eosinophils Relative: 0 %
HCT: 36.1 % — ABNORMAL LOW (ref 39.0–52.0)
Hemoglobin: 10.5 g/dL — ABNORMAL LOW (ref 13.0–17.0)
Immature Granulocytes: 1 %
Lymphocytes Relative: 7 %
Lymphs Abs: 1.5 10*3/uL (ref 0.7–4.0)
MCH: 31.6 pg (ref 26.0–34.0)
MCHC: 29.1 g/dL — ABNORMAL LOW (ref 30.0–36.0)
MCV: 108.7 fL — ABNORMAL HIGH (ref 80.0–100.0)
Monocytes Absolute: 1.5 10*3/uL — ABNORMAL HIGH (ref 0.1–1.0)
Monocytes Relative: 7 %
Neutro Abs: 17.6 10*3/uL — ABNORMAL HIGH (ref 1.7–7.7)
Neutrophils Relative %: 85 %
Platelets: 169 10*3/uL (ref 150–400)
RBC: 3.32 MIL/uL — ABNORMAL LOW (ref 4.22–5.81)
RDW: 12.2 % (ref 11.5–15.5)
WBC: 20.7 10*3/uL — ABNORMAL HIGH (ref 4.0–10.5)
nRBC: 0 % (ref 0.0–0.2)

## 2023-11-16 LAB — CBG MONITORING, ED: Glucose-Capillary: 144 mg/dL — ABNORMAL HIGH (ref 70–99)

## 2023-11-16 LAB — CK: Total CK: 1021 U/L — ABNORMAL HIGH (ref 49–397)

## 2023-11-16 LAB — LIPID PANEL
Cholesterol: 77 mg/dL (ref 0–200)
HDL: 36 mg/dL — ABNORMAL LOW (ref >40)
LDL Cholesterol: 20 mg/dL (ref 0–99)
Total CHOL/HDL Ratio: 2.1 {ratio}
Triglycerides: 104 mg/dL (ref <150)
VLDL: 21 mg/dL (ref 0–40)

## 2023-11-16 LAB — MAGNESIUM: Magnesium: 1.1 mg/dL — ABNORMAL LOW (ref 1.7–2.4)

## 2023-11-16 LAB — TROPONIN I (HIGH SENSITIVITY)
Troponin I (High Sensitivity): 1018 ng/L (ref <18)
Troponin I (High Sensitivity): 1138 ng/L (ref <18)

## 2023-11-16 MED ORDER — SODIUM CHLORIDE 0.9 % IV BOLUS
1000.0000 mL | Freq: Once | INTRAVENOUS | Status: AC
  Administered 2023-11-16: 1000 mL via INTRAVENOUS

## 2023-11-16 MED ORDER — PIPERACILLIN-TAZOBACTAM 3.375 G IVPB 30 MIN
3.3750 g | Freq: Once | INTRAVENOUS | Status: AC
Start: 2023-11-16 — End: 2023-11-16
  Administered 2023-11-16: 3.375 g via INTRAVENOUS
  Filled 2023-11-16: qty 50

## 2023-11-16 MED ORDER — VANCOMYCIN HCL 1500 MG/300ML IV SOLN
1500.0000 mg | Freq: Once | INTRAVENOUS | Status: AC
  Administered 2023-11-16: 1500 mg via INTRAVENOUS
  Filled 2023-11-16: qty 300

## 2023-11-16 MED ORDER — DEXMEDETOMIDINE HCL IN NACL 400 MCG/100ML IV SOLN: 0.0000 ug/kg/h | INTRAVENOUS | Status: DC

## 2023-11-16 MED ORDER — CALCIUM GLUCONATE-NACL 1-0.675 GM/50ML-% IV SOLN
1.0000 g | Freq: Once | INTRAVENOUS | Status: AC
  Administered 2023-11-16: 1000 mg via INTRAVENOUS
  Filled 2023-11-16: qty 50

## 2023-11-16 MED ORDER — VANCOMYCIN HCL 1.5 G IV SOLR
1500.0000 mg | Freq: Once | INTRAVENOUS | Status: DC
  Filled 2023-11-16: qty 30

## 2023-11-16 NOTE — ED Provider Notes (Addendum)
[place] EMERGENCY DEPARTMENT AT [facility] Provider Note   CSN: [id number] Arrival date & time: 11/16/23  1926     History  Chief Complaint  Patient presents with   Altered Mental Status    Steve Hickman is a 83 y.o. male with history of coronary disease, diabetes, high blood pressure, high cholesterol, presenting from home with concern for confusion and weakness.  EMS reports that the patient's ex-wife spoke to him on the phone at 10 PM yesterday evening and he sounded garbled and confused.  He came to check on him this morning and found him laying on the floor, contracted on the right side, not able to speak to her.  EMS reports he has not been able to communicate with them since their arrival.  Blood pressure did drop slightly hypotensive and he was given 200 cc of fluid by EMS prior to arrival.  Glucose was 194.  Heart rate 56, 98% on room air.  The patient is nonverbal on arrival.  Per history provided by EMS, subsequently from the ex-wife, the patient typically lives independently, is fully functional, it is not clear when the last time he was normal.  I am not able to reach any emergency contact with the number provided in his current chart.  The patient arrives alone.  Update - his ex-wife feels me that the patient was last seen normal 2 days ago on Saturday when he went out with a friend to a baseball game in the evening.  HPI     Home Medications Prior to Admission medications   Medication Sig Start Date End Date Taking? Authorizing Provider  aspirin EC 81 MG EC tablet Take 1 tablet (81 mg total) by mouth daily. 05/31/13   Leone Brand, NP  atorvastatin (LIPITOR) 80 MG tablet TAKE 1 TABLET BY MOUTH ONCE DAILY AT 6 PM 08/11/23   Rollene Rotunda, MD  glimepiride (AMARYL) 2 MG tablet Take 2 mg by mouth daily with breakfast.    [provider]  metFORMIN (GLUCOPHAGE) 1000 MG tablet Take 1,000 mg by mouth 2 (two) times daily with a meal.    [provider]  metoprolol tartrate (LOPRESSOR) 25 MG tablet Take 1/2 (one-half) tablet by mouth twice daily Patient taking differently: Take 12.5 mg by mouth 2 (two) times daily. 08/27/23   Rollene Rotunda, MD  pantoprazole (PROTONIX) 40 MG tablet Take 1 tablet (40 mg total) by mouth daily. 09/03/23   Rollene Rotunda, MD      Allergies    Patient has no known allergies.    Review of Systems   Review of Systems  Physical Exam Updated Vital Signs BP 139/70   Pulse (!) 55   Temp 98 F (36.7 C) (Oral)   Resp (!) 26   SpO2 100%  Physical Exam Constitutional:      General: He is not in acute distress. HENT:     Head: Normocephalic and atraumatic.  Eyes:     Conjunctiva/sclera: Conjunctivae normal.     Pupils: Pupils are equal, round, and reactive to light.  Cardiovascular:     Rate and Rhythm: Normal rate and regular rhythm.  Pulmonary:     Effort: Pulmonary effort is normal. No respiratory distress.  Abdominal:     General: There is no distension.     Tenderness: There is no abdominal tenderness.  Skin:    General: Skin is warm and dry.  Neurological:     General: No focal deficit present.  Mental Status: He is alert. Mental status is at baseline.     Comments: Left gaze deviation, nonverbal, right arm contracted, patient is able to squeeze fingers of left hand but otherwise does not follow commands; patient is able to wiggle both toes     ED Results / Procedures / Treatments   Labs (all labs ordered are listed, but only abnormal results are displayed) Labs Reviewed  LIPID PANEL - Abnormal; Notable for the following components:      Result Value   HDL 36 (*)    All other components within normal limits  BASIC METABOLIC PANEL - Abnormal; Notable for the following components:   Sodium 146 (*)    Potassium 6.1 (*)    CO2 10 (*)    Glucose, Bld 170 (*)    BUN 41 (*)    Creatinine, Ser 2.86 (*)    Calcium 8.5 (*)    GFR, Estimated 21 (*)    Anion gap 25 (*)     All other components within normal limits  CBC WITH DIFFERENTIAL/PLATELET - Abnormal; Notable for the following components:   WBC 20.7 (*)    RBC 3.32 (*)    Hemoglobin 10.5 (*)    HCT 36.1 (*)    MCV 108.7 (*)    MCHC 29.1 (*)    Neutro Abs 17.6 (*)    Monocytes Absolute 1.5 (*)    Abs Immature Granulocytes 0.14 (*)    All other components within normal limits  MAGNESIUM - Abnormal; Notable for the following components:   Magnesium 1.1 (*)    All other components within normal limits  CK - Abnormal; Notable for the following components:   Total CK 1,021 (*)    All other components within normal limits  CBG MONITORING, ED - Abnormal; Notable for the following components:   Glucose-Capillary 144 (*)    All other components within normal limits  TROPONIN I (HIGH SENSITIVITY) - Abnormal; Notable for the following components:   Troponin I (High Sensitivity) 1,018 (*)    All other components within normal limits  TROPONIN I (HIGH SENSITIVITY) - Abnormal; Notable for the following components:   Troponin I (High Sensitivity) 1,138 (*)    All other components within normal limits  CULTURE, BLOOD (ROUTINE X 2)  CULTURE, BLOOD (ROUTINE X 2)  URINALYSIS, ROUTINE W REFLEX MICROSCOPIC  BASIC METABOLIC PANEL  TROPONIN I (HIGH SENSITIVITY)    EKG EKG Interpretation Date/Time:  Monday November 16 2023 20:05:35 EST Ventricular Rate:  53 PR Interval:    QRS Duration:  118 QT Interval:  584 QTC Calculation: 549 R Axis:   58  Text Interpretation: Junctional rhythm Incomplete right bundle branch block Confirmed by Alvester Chou [phone]) on 11/16/2023 8:22:43 PM  Radiology CT Head Wo Contrast Result Date: 11/16/2023 CLINICAL DATA:  Neuro deficit, acute, stroke suspected right sided weakness EXAM: CT HEAD WITHOUT CONTRAST TECHNIQUE: Contiguous axial images were obtained from the base of the skull through the vertex without intravenous contrast. RADIATION DOSE REDUCTION: This exam was  performed according to the departmental dose-optimization program which includes automated exposure control, adjustment of the mA and/or kV according to patient size and/or use of iterative reconstruction technique. COMPARISON:  None Available. FINDINGS: Brain: Normal anatomic configuration. Parenchymal volume loss is commensurate with the patient's age. Mild periventricular white matter changes are present likely reflecting the sequela of small vessel ischemia. No abnormal intra or extra-axial mass lesion or fluid collection. No abnormal mass effect or midline shift. No  evidence of acute intracranial hemorrhage or infarct. Ventricular size is normal. Cerebellum unremarkable. Vascular: No asymmetric hyperdense vasculature at the skull base. Skull: Intact Sinuses/Orbits: Paranasal sinuses are clear. Orbits are unremarkable. Other: Mastoid air cells and middle ear cavities are clear. IMPRESSION: 1. No acute intracranial hemorrhage or infarct. 2. Mild senescent change. Electronically Signed   By: Helyn Numbers M.D.   On: 11/16/2023 20:51   DG Chest Portable 1 View Result Date: 11/16/2023 CLINICAL DATA:  Aspiration evaluation. EXAM: PORTABLE CHEST 1 VIEW COMPARISON:  07/02/2022 FINDINGS: Prior median sternotomy and CABG. Mild cardiomegaly. Mediastinal contours within normal limits. Aortic atherosclerosis. No confluent airspace opacities or effusions. No acute bony abnormality. IMPRESSION: Cardiomegaly.  No active disease. Electronically Signed   By: Charlett Nose M.D.   On: 11/16/2023 20:02    Procedures .Critical Care  Performed by: Terald Sleeper, MD Authorized by: Terald Sleeper, MD   Critical care provider statement:    Critical care time (minutes):  45   Critical care time was exclusive of:  Separately billable procedures and treating other patients   Critical care was necessary to treat or prevent imminent or life-threatening deterioration of the following conditions:  Sepsis   Critical care  was time spent personally by me on the following activities:  Ordering and performing treatments and interventions, ordering and review of laboratory studies, ordering and review of radiographic studies, pulse oximetry, review of old charts, examination of patient and evaluation of patient's response to treatment   Care discussed with: admitting provider       Medications Ordered in ED Medications  sodium chloride 0.9 % bolus 1,000 mL (0 mLs Intravenous Stopped 11/16/23 2017)  sodium chloride 0.9 % bolus 1,000 mL (0 mLs Intravenous Stopped 11/16/23 2139)  calcium gluconate 1 g/ 50 mL sodium chloride IVPB (0 mg Intravenous Stopped 11/16/23 2139)  piperacillin-tazobactam (ZOSYN) IVPB 3.375 g (0 g Intravenous Stopped 11/16/23 2212)  vancomycin (VANCOREADY) IVPB 1500 mg/300 mL (0 mg Intravenous Stopped 11/16/23 2347)    ED Course/ Medical Decision Making/ A&P Clinical Course as of 11/17/23 0002  Mon Nov 16, 2023  1934 No answer from Serge Bayerl, number not in service [MT]  2022 Delray Alt ex-wife offered updated phone number  - now confirms patient and his friend went to a baseball game on Saturday and he was behaving normally; however the patient sounded confused when he called her at 10 pm last night, repeating "Are you alright?" And called after 11 pm and was emotionally labile, hung up [MT]  2029 Potassium(!): 6.1 [MT]  2029 Anion gap(!): 25 [MT]  2029 CK Total(!): 1,021 [MT]  2040 Peaked T waves consistent with hyperkalemia - less likely STEMI, suspect trop is due to demand ischemia.  IV fluids ordered [MT]  2041 IV calcium ordered [MT]  2212 Neuro consulted - dr Otelia Limes, will see pt stat, recommending MRI imaging and MRA as ordered (noncontrast) [MT]  2227 Delta troponin flat [MT]  2301 Dr Otelia Limes - high concern for stroke - plan to proceed with MRI imaging and medical admission [MT]  2348 Spoke to hospitalist - she requests cardiology consultation, we will page and have cardiology consult as  well about NSTEMI troponin elevations [MT]  2358 I spoke to the cardiology fellow who will consult and leave recommendations for inpatient team. [MT]    Clinical Course User Index [MT] Rorey Hodges, Kermit Balo, MD  Medical Decision Making Amount and/or Complexity of Data Reviewed Labs: ordered. Decision-making details documented in ED Course. Radiology: ordered. ECG/medicine tests: ordered.  Risk Prescription drug management. Decision regarding hospitalization.   This patient presents to the ED with concern for right-sided weakness, garbled speech. This involves an extensive number of treatment options, and is a complaint that carries with it a high risk of complications and morbidity.  The differential diagnosis includes CVA versus metabolic derangement versus infection versus other  Co-morbidities that complicate the patient evaluation: Stroke risk factors include high cholesterol, diabetes  Additional history obtained from EMS  External records from outside source obtained and reviewed including [facility] office visit May 2024 noting history of essential hypertension, type 2 diabetes, chronic kidney disease  I ordered and personally interpreted labs.  The pertinent results include: Elevated troponin over thousand but flat on repeat.  BMP with elevated BUN, creatinine, hyperkalemia, anion gap.  White blood cell count elevated at 20.7.  I ordered imaging studies including CT of the head, MRI imaging I independently visualized and interpreted imaging which showed no emergent finding reported on CT head but high clinical concern for stroke, MRI imaging ordered and pending I agree with the radiologist interpretation  The patient was maintained on a cardiac monitor.  I personally viewed and interpreted the cardiac monitored which showed an underlying rhythm of: Sinus rhythm  Per my interpretation the patient's ECG shows sinus rhythm with hyperacute T waves  I  ordered medication including IV fluids and calcium gluconate for hyperkalemia with EKG changes.  Broad-spectrum antibiotics for potential sepsis of unclear etiology.  IV fluids per sepsis protocol  I have reviewed the patients home medicines and have made adjustments as needed  I requested consultation with the neurology,  and discussed lab and imaging findings as well as pertinent plan - they recommend: MRI imaging - concern for potential stroke  After the interventions noted above, I reevaluated the patient and found that they have: stayed the same   Plan: Will need repeat BMP now after treatment for hyperkalemia; MRI imaging and admission; management of AKI; further stroke recs per neurologist  I suspect troponin elevation is demand ischemia from prolonged downtime at home.  I do not see STEMI criteria at this time.  He'll need trending ECG and troponins overnight, but no indication for heparin at this moment, particularly with concern for possible stroke, to prevent hemorrhagic conversion.  Dispostion:  After consideration of the diagnostic results and the patients response to treatment, I feel that the patent would benefit from medical admission.      Final Clinical Impression(s) / ED Diagnoses Final diagnoses:  Transient alteration of awareness  Hyperkalemia  AKI (acute kidney injury) [facility])    Rx / DC Orders ED Discharge Orders     None         Tarini Carrier, Kermit Balo, MD 11/16/23 5366    Terald Sleeper, MD 11/17/23 0002

## 2023-11-16 NOTE — ED Notes (Signed)
Sister Willaim Rayas [phone] would like an update asap

## 2023-11-16 NOTE — Progress Notes (Incomplete)
EEG complete - results pending 

## 2023-11-16 NOTE — Progress Notes (Signed)
ED Pharmacy Antibiotic Sign Off An antibiotic consult was received from an ED provider for sepsis per pharmacy dosing for vancomycin and Zosyn. A chart review was completed to assess appropriateness.   The following one time order(s) were placed:  Vancomycin 1500mg  IV x1 Zosyn 3.375g IV over 30 min x1  Further antibiotic and/or antibiotic pharmacy consults should be ordered by the admitting provider if indicated.   Thank you for allowing pharmacy to be a part of this patient's care.   Jenita Seashore, [facility]  Clinical Pharmacist 11/16/23 9:23 PM

## 2023-11-16 NOTE — ED Notes (Signed)
Patient transported to CT scan . 

## 2023-11-16 NOTE — Progress Notes (Signed)
Pt going to MRI, eeg to be done after as schedule allows.

## 2023-11-16 NOTE — ED Notes (Signed)
2nd blood culture specimens collected by phlebotomist.

## 2023-11-16 NOTE — Consult Note (Signed)
NEUROLOGY CONSULT NOTE   Date of service: November 16, 2023 Patient Name: Steve Hickman MRN:  [id number] DOB:  10-30-1940 Chief Complaint: Aphasia with right sided weakness Requesting Provider: Terald Sleeper, MD  History of Present Illness  Steve Hickman is an 83 y.o. male with a past medical history of CAD s/p CABG x 2 (LIMA-LAD, SV-PDA) 07/07/13 (EF 60%), DM, Dyslipidemia, LBBB and prior STEMI who was BIB [facility] EMS from home after ex wife called for AMS. She last spoke to him Sunday night at 10 PM and at that time his speech was garbled. Ex wife then went to his home to check on him this AM and found him in the floor at his home today aphasic and contracted on his right side. EMS reported that he had not been able to communicate with them since their arrival. Vitals per EMS: HR 56, BP 120/60, 98% RA, CBG 194. Blood pressure did drop with EMS and he was slightly hypotensive, thus he was given 200 cc of fluid by EMS prior to arrival.   CT head was obtained, revealing no acute intracranial hemorrhage or infarct.  At baseline, the patient is usually A&Ox4 and ambulatory, lives independently and is fully functional.  LKN was on Saturday evening when he went to a ball game with a friend.   LKW: Saturday evening Modified rankin score: 0-Completely asymptomatic and back to baseline post- stroke IV Thrombolysis:  No: Out of the time window EVT: No: Out of the time window   NIHSS components Score: Comment  1a Level of Conscious 0[] 1[] 2[x] 3[]     1b LOC Questions 0[] 1[] 2[x]      1c LOC Commands 0[] 1[] 2[x]      2 Best Gaze 0[] 1[] 2[x]      3 Visual 0[] 1[] 2[x] 3[]     4 Facial Palsy 0[] 1[] 2[x] 3[]     5a Motor Arm - left 0[x] 1[] 2[] 3[] 4[] UN[]   5b Motor Arm - Right 0[] 1[] 2[] 3[] 4[x] UN[]   6a Motor Leg - Left 0[x] 1[] 2[] 3[] 4[] UN[]   6b Motor Leg - Right 0[] 1[] 2[x] 3[] 4[] UN[]   7 Limb Ataxia 0[x] 1[] 2[] 3[] UN[]    8 Sensory 0[] 1[] 2[x] UN[]     9 Best  Language 0[] 1[] 2[] 3[x]     10 Dysarthria 0[] 1[] 2[x] UN[]     11  Extinct. and Inattention 0[]  1[]  2[x]       TOTAL:   27      ROS  Unable to ascertain due to aphasia.  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/07/13 (EF 60%)  05/29/2013   Diabetes mellitus (HCC) 05/31/2013   Dyslipidemia, goal LDL below 70 05/31/2013   Left bundle branch block    STEMI (ST elevation myocardial infarction)- CFX BMS, 05/28/2013 05/29/2013   STEMI     Past Surgical History:  Procedure Laterality Date   CORONARY ARTERY BYPASS GRAFT N/A 07/07/2013   Procedure: CORONARY ARTERY BYPASS GRAFTING (CABG);  Surgeon: Loreli Slot, MD;  Location: [facility] OR;  Service: Open Heart Surgery;  Laterality: N/A;  CABG X 2   LEFT HEART CATH N/A 05/28/2013   Procedure: LEFT HEART CATH;  Surgeon: Runell Gess, MD;  Location: [facility] CATH LAB;  Service: Cardiovascular;  Laterality: N/A;   NO PAST SURGERIES  PERCUTANEOUS CORONARY STENT INTERVENTION (PCI-S)  05/28/2013   Procedure: PERCUTANEOUS CORONARY STENT INTERVENTION (PCI-S);  Surgeon: Runell Gess, MD;  Location: [facility] CATH LAB;  Service: Cardiovascular;;  prox cx    Family History: Family History  Problem Relation Age of Onset   Healthy Mother        NO CARDIAC RELATED HEALTH ISSUES    Healthy Father        NO CARDIAC RELATED HEALTH ISSUES    Stroke Father    Healthy Sister        NO CARDIAC RELATED HEALTH ISSUES    Heart attack Neg Hx    Hypertension Neg Hx     Social History  reports that he has never smoked. He has never used smokeless tobacco. He reports current alcohol use of about 1.0 standard drink of alcohol per week. He reports that he does not use drugs.  No Known Allergies  Medications   Current Facility-Administered Medications:    piperacillin-tazobactam (ZOSYN) IVPB 3.375 g, 3.375 g, Intravenous, Once, Jenita Seashore, RPH, Last Rate: 100 mL/hr at 11/16/23 2143, 3.375 g at 11/16/23 2143    vancomycin (VANCOREADY) IVPB 1500 mg/300 mL, 1,500 mg, Intravenous, Once, Terald Sleeper, MD, Last Rate: 150 mL/hr at 11/16/23 2154, 1,500 mg at 11/16/23 2154  Current Outpatient Medications:    aspirin EC 81 MG EC tablet, Take 1 tablet (81 mg total) by mouth daily., Disp: , Rfl:    atorvastatin (LIPITOR) 80 MG tablet, TAKE 1 TABLET BY MOUTH ONCE DAILY AT 6 PM, Disp: 90 tablet, Rfl: 3   glimepiride (AMARYL) 2 MG tablet, Take 2 mg by mouth daily with breakfast., Disp: , Rfl:    metFORMIN (GLUCOPHAGE) 1000 MG tablet, Take 1,000 mg by mouth 2 (two) times daily with a meal., Disp: , Rfl:    metoprolol tartrate (LOPRESSOR) 25 MG tablet, Take 1/2 (one-half) tablet by mouth twice daily (Patient taking differently: Take 12.5 mg by mouth 2 (two) times daily.), Disp: 90 tablet, Rfl: 3   pantoprazole (PROTONIX) 40 MG tablet, Take 1 tablet (40 mg total) by mouth daily., Disp: 90 tablet, Rfl: 2   Vitals   Vitals:   11/16/23 2026 11/16/23 2030 11/16/23 2045 11/16/23 2115  BP:  (!) 109/51 (!) 121/111 131/83  Pulse:  (!) 52 (!) 55 (!) 53  Resp:  (!) 26 (!) 26 (!) 29  Temp: 98 F (36.7 C)     TempSrc: Oral     SpO2:  100% 100% 99%    There is no height or weight on file to calculate BMI.  Physical Exam   Physical Exam  HEENT-  [place]/AT    Lungs- Tachypneic Extremities- No edema  Neurological Examination Mental Status: Awake with decreased level of alertness. Dense receptive and expressive aphasia with right hemineglect. Not responding to any questions or commands, but does grimace and move LUE purposefully to sternal rub.  Cranial Nerves: II: No blink to threat in right hemifield. Blinks to threat in left hemifield. PERRL  III,IV, VI: No ptosis. Forced left gaze deviation  V: Grimaces to noxious stimulus on the right after a delay  VII: Right facial droop is prominent.  VIII: Unable to assess IX,X: Gag reflex deferred XI: Head preferentially rotated to the left XII: Not following commands  for assessment Motor/Sensory: LUE: Moves LUE purposefully and antigravity to sternal rub. Reacts briskly to noxious.  RUE: Flaccid with no movement or grimace to noxious.  LLE: Elevates antigravity and withdraws briskly to noxious  RLE: Delayed movement with 2/5 strength to noxious Deep Tendon Reflexes: Right patellar slightly brisker than left.  Cerebellar: Unable to assess Gait: Deferred   Labs/Imaging/Neurodiagnostic studies   CBC:  Recent Labs  Lab 2023/11/18 1937  WBC 20.7*  NEUTROABS 17.6*  HGB 10.5*  HCT 36.1*  MCV 108.7*  PLT 169   Basic Metabolic Panel:  Lab Results  Component Value Date   NA 146 (H) November 18, 2023   K 6.1 (H) Nov 18, 2023   CO2 10 (L) November 18, 2023   GLUCOSE 170 (H) Nov 18, 2023   BUN 41 (H) 11-18-2023   CREATININE 2.86 (H) 11-18-23   CALCIUM 8.5 (L) 11/18/23   GFRNONAA 21 (L) 11-18-23   GFRAA >90 07/10/2013   Lipid Panel:  Lab Results  Component Value Date   LDLCALC 20 11-18-23   HgbA1c:  Lab Results  Component Value Date   HGBA1C 8.0 (H) 11/19/2021   Urine Drug Screen: No results found for: "LABOPIA", "COCAINSCRNUR", "LABBENZ", "AMPHETMU", "THCU", "LABBARB"  Alcohol Level No results found for: "ETH" INR  Lab Results  Component Value Date   INR 1.40 07/07/2013   APTT  Lab Results  Component Value Date   APTT 37 07/07/2013    ASSESSMENT  83 y.o. male with a past medical history of CAD s/p CABG x 2 (LIMA-LAD, SV-PDA) 07/07/13 (EF 60%), DM, Dyslipidemia, LBBB and prior STEMI who was BIB [place] [facility] from home after ex wife called for AMS. She last spoke to him Sunday night at 10 PM and at that time his speech was garbled. Ex wife then went to his home to check on him this AM and found him in the floor at his home today aphasic and contracted on his right side. EMS reported that he had not been able to communicate with them since their arrival. Vitals per EMS: HR 56, BP 120/60, 98% RA, CBG 194. Blood pressure did drop with EMS and he was  slightly hypotensive, thus he was given 200 cc of fluid by EMS prior to arrival. CT head was obtained, revealing no acute intracranial hemorrhage or infarct. At baseline, the patient is usually A&Ox4 and ambulatory, lives independently and is fully functional. LKN was on Saturday evening when he went to a ball game with a friend.  - Exam reveals findings that best localize to the left MCA and ACA territories. 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 NIHSS 27 - CT head: No acute intracranial hemorrhage or infarct. Mild senescent change. - Not a TNK or thrombectomy candidate due to time criteria.   RECOMMENDATIONS  - MRI brain with MRA of head and neck, all WITHOUT contrast - EEG (ordered) - Continue home ASA and atorvastatin. Add Plavix 75 mg po every day to his regimen. Will need NGT.  - Repeat CT head 24 hours after MRI to assess for possible development of malignant edema with mass effect - TTE - HgbA1c, fasting lipid panel - PT consult, OT consult, Speech consult - Risk factor modification - Telemetry monitoring - Frequent neuro checks - NPO - IVF - Sepsis management per primary team   ______________________________________________________________________    Dessa Phi, Chaelyn Bunyan, MD [facility]

## 2023-11-16 NOTE — ED Triage Notes (Signed)
[facility] BIB [facility] from home after ex wife call for AMS. Last spoke to him last night at 10pm and he was garbled, patient is usually A&Ox4 and ambulatory. Ex wife found him in the floor presenting AMS and stroke like symptoms today. HR 56, BP 120/60, 98% RA, CBG 194

## 2023-11-17 ENCOUNTER — Other Ambulatory Visit: Payer: Self-pay

## 2023-11-17 ENCOUNTER — Inpatient Hospital Stay (HOSPITAL_COMMUNITY): Payer: 59

## 2023-11-17 ENCOUNTER — Emergency Department (HOSPITAL_COMMUNITY): Payer: 59

## 2023-11-17 DIAGNOSIS — R64 Cachexia: Secondary | ICD-10-CM | POA: Diagnosis present

## 2023-11-17 DIAGNOSIS — N1831 Chronic kidney disease, stage 3a: Secondary | ICD-10-CM | POA: Diagnosis present

## 2023-11-17 DIAGNOSIS — Z7189 Other specified counseling: Secondary | ICD-10-CM | POA: Diagnosis not present

## 2023-11-17 DIAGNOSIS — I4819 Other persistent atrial fibrillation: Secondary | ICD-10-CM

## 2023-11-17 DIAGNOSIS — R451 Restlessness and agitation: Secondary | ICD-10-CM | POA: Diagnosis not present

## 2023-11-17 DIAGNOSIS — R531 Weakness: Secondary | ICD-10-CM | POA: Diagnosis present

## 2023-11-17 DIAGNOSIS — E7800 Pure hypercholesterolemia, unspecified: Secondary | ICD-10-CM | POA: Diagnosis present

## 2023-11-17 DIAGNOSIS — N182 Chronic kidney disease, stage 2 (mild): Secondary | ICD-10-CM

## 2023-11-17 DIAGNOSIS — R410 Disorientation, unspecified: Secondary | ICD-10-CM

## 2023-11-17 DIAGNOSIS — G460 Middle cerebral artery syndrome: Secondary | ICD-10-CM | POA: Diagnosis present

## 2023-11-17 DIAGNOSIS — N179 Acute kidney failure, unspecified: Secondary | ICD-10-CM

## 2023-11-17 DIAGNOSIS — E1165 Type 2 diabetes mellitus with hyperglycemia: Secondary | ICD-10-CM | POA: Diagnosis present

## 2023-11-17 DIAGNOSIS — G8191 Hemiplegia, unspecified affecting right dominant side: Secondary | ICD-10-CM | POA: Diagnosis present

## 2023-11-17 DIAGNOSIS — I639 Cerebral infarction, unspecified: Secondary | ICD-10-CM

## 2023-11-17 DIAGNOSIS — R29727 NIHSS score 27: Secondary | ICD-10-CM | POA: Diagnosis present

## 2023-11-17 DIAGNOSIS — Z66 Do not resuscitate: Secondary | ICD-10-CM | POA: Diagnosis not present

## 2023-11-17 DIAGNOSIS — G9340 Encephalopathy, unspecified: Secondary | ICD-10-CM

## 2023-11-17 DIAGNOSIS — E870 Hyperosmolality and hypernatremia: Secondary | ICD-10-CM | POA: Diagnosis present

## 2023-11-17 DIAGNOSIS — E860 Dehydration: Secondary | ICD-10-CM | POA: Diagnosis present

## 2023-11-17 DIAGNOSIS — J9601 Acute respiratory failure with hypoxia: Secondary | ICD-10-CM | POA: Diagnosis not present

## 2023-11-17 DIAGNOSIS — I6389 Other cerebral infarction: Secondary | ICD-10-CM | POA: Diagnosis not present

## 2023-11-17 DIAGNOSIS — I21A1 Myocardial infarction type 2: Secondary | ICD-10-CM | POA: Diagnosis present

## 2023-11-17 DIAGNOSIS — R404 Transient alteration of awareness: Secondary | ICD-10-CM | POA: Diagnosis not present

## 2023-11-17 DIAGNOSIS — I63522 Cerebral infarction due to unspecified occlusion or stenosis of left anterior cerebral artery: Secondary | ICD-10-CM | POA: Diagnosis present

## 2023-11-17 DIAGNOSIS — R4182 Altered mental status, unspecified: Secondary | ICD-10-CM | POA: Diagnosis present

## 2023-11-17 DIAGNOSIS — R569 Unspecified convulsions: Secondary | ICD-10-CM

## 2023-11-17 DIAGNOSIS — E872 Acidosis, unspecified: Secondary | ICD-10-CM | POA: Diagnosis present

## 2023-11-17 DIAGNOSIS — Z515 Encounter for palliative care: Secondary | ICD-10-CM | POA: Diagnosis not present

## 2023-11-17 DIAGNOSIS — M6282 Rhabdomyolysis: Secondary | ICD-10-CM

## 2023-11-17 DIAGNOSIS — E875 Hyperkalemia: Secondary | ICD-10-CM

## 2023-11-17 DIAGNOSIS — I214 Non-ST elevation (NSTEMI) myocardial infarction: Secondary | ICD-10-CM | POA: Insufficient documentation

## 2023-11-17 DIAGNOSIS — I213 ST elevation (STEMI) myocardial infarction of unspecified site: Secondary | ICD-10-CM | POA: Insufficient documentation

## 2023-11-17 DIAGNOSIS — I2489 Other forms of acute ischemic heart disease: Secondary | ICD-10-CM

## 2023-11-17 DIAGNOSIS — E1122 Type 2 diabetes mellitus with diabetic chronic kidney disease: Secondary | ICD-10-CM | POA: Diagnosis present

## 2023-11-17 DIAGNOSIS — G9341 Metabolic encephalopathy: Secondary | ICD-10-CM | POA: Diagnosis present

## 2023-11-17 DIAGNOSIS — I63423 Cerebral infarction due to embolism of bilateral anterior cerebral arteries: Secondary | ICD-10-CM

## 2023-11-17 DIAGNOSIS — N170 Acute kidney failure with tubular necrosis: Secondary | ICD-10-CM | POA: Diagnosis present

## 2023-11-17 DIAGNOSIS — I63512 Cerebral infarction due to unspecified occlusion or stenosis of left middle cerebral artery: Secondary | ICD-10-CM | POA: Diagnosis present

## 2023-11-17 DIAGNOSIS — D6489 Other specified anemias: Secondary | ICD-10-CM | POA: Diagnosis present

## 2023-11-17 DIAGNOSIS — I129 Hypertensive chronic kidney disease with stage 1 through stage 4 chronic kidney disease, or unspecified chronic kidney disease: Secondary | ICD-10-CM | POA: Diagnosis present

## 2023-11-17 DIAGNOSIS — I442 Atrioventricular block, complete: Secondary | ICD-10-CM | POA: Diagnosis present

## 2023-11-17 HISTORY — DX: Other forms of acute ischemic heart disease: I24.89

## 2023-11-17 HISTORY — DX: Cerebral infarction, unspecified: I63.9

## 2023-11-17 HISTORY — DX: Other persistent atrial fibrillation: I48.19

## 2023-11-17 HISTORY — DX: Altered mental status, unspecified: R41.82

## 2023-11-17 HISTORY — DX: Encephalopathy, unspecified: G93.40

## 2023-11-17 LAB — BASIC METABOLIC PANEL
Anion gap: 21 — ABNORMAL HIGH (ref 5–15)
Anion gap: 24 — ABNORMAL HIGH (ref 5–15)
BUN: 42 mg/dL — ABNORMAL HIGH (ref 8–23)
BUN: 45 mg/dL — ABNORMAL HIGH (ref 8–23)
CO2: 10 mmol/L — ABNORMAL LOW (ref 22–32)
CO2: 9 mmol/L — ABNORMAL LOW (ref 22–32)
Calcium: 7.8 mg/dL — ABNORMAL LOW (ref 8.9–10.3)
Calcium: 8.1 mg/dL — ABNORMAL LOW (ref 8.9–10.3)
Chloride: 111 mmol/L (ref 98–111)
Chloride: 113 mmol/L — ABNORMAL HIGH (ref 98–111)
Creatinine, Ser: 2.75 mg/dL — ABNORMAL HIGH (ref 0.61–1.24)
Creatinine, Ser: 2.84 mg/dL — ABNORMAL HIGH (ref 0.61–1.24)
GFR, Estimated: 21 mL/min — ABNORMAL LOW (ref >60)
GFR, Estimated: 22 mL/min — ABNORMAL LOW (ref >60)
Glucose, Bld: 166 mg/dL — ABNORMAL HIGH (ref 70–99)
Glucose, Bld: 174 mg/dL — ABNORMAL HIGH (ref 70–99)
Potassium: 5.3 mmol/L — ABNORMAL HIGH (ref 3.5–5.1)
Potassium: 5.8 mmol/L — ABNORMAL HIGH (ref 3.5–5.1)
Sodium: 143 mmol/L (ref 135–145)
Sodium: 145 mmol/L (ref 135–145)

## 2023-11-17 LAB — ECHOCARDIOGRAM COMPLETE
AR max vel: 1.21 cm2
AV Area VTI: 1.5 cm2
AV Area mean vel: 1.2 cm2
AV Mean grad: 19 mm[Hg]
AV Peak grad: 41.5 mm[Hg]
Ao pk vel: 3.22 m/s
Area-P 1/2: 3.48 cm2
Calc EF: 40.4 %
Height: 65 in
S' Lateral: 3.2 cm
Single Plane A2C EF: 28.1 %
Single Plane A4C EF: 50.6 %
Weight: 2261.04 [oz_av]

## 2023-11-17 LAB — CK
Total CK: 2094 U/L — ABNORMAL HIGH (ref 49–397)
Total CK: 2297 U/L — ABNORMAL HIGH (ref 49–397)

## 2023-11-17 LAB — VITAMIN B12: Vitamin B-12: <50 pg/mL — ABNORMAL LOW (ref 180–914)

## 2023-11-17 LAB — HEPATIC FUNCTION PANEL
ALT: 31 U/L (ref 0–44)
AST: 67 U/L — ABNORMAL HIGH (ref 15–41)
Albumin: 3.4 g/dL — ABNORMAL LOW (ref 3.5–5.0)
Alkaline Phosphatase: 50 U/L (ref 38–126)
Bilirubin, Direct: 0.3 mg/dL — ABNORMAL HIGH (ref 0.0–0.2)
Indirect Bilirubin: 2.1 mg/dL — ABNORMAL HIGH (ref 0.3–0.9)
Total Bilirubin: 2.4 mg/dL — ABNORMAL HIGH (ref 0.0–1.2)
Total Protein: 6.4 g/dL — ABNORMAL LOW (ref 6.5–8.1)

## 2023-11-17 LAB — ETHANOL: Alcohol, Ethyl (B): <10 mg/dL (ref <10)

## 2023-11-17 LAB — RETICULOCYTES
Immature Retic Fract: 22.3 % — ABNORMAL HIGH (ref 2.3–15.9)
RBC.: 3.07 MIL/uL — ABNORMAL LOW (ref 4.22–5.81)
Retic Count, Absolute: 51.9 10*3/uL (ref 19.0–186.0)
Retic Ct Pct: 1.7 % (ref 0.4–3.1)

## 2023-11-17 LAB — CBC
HCT: 31.3 % — ABNORMAL LOW (ref 39.0–52.0)
Hemoglobin: 10 g/dL — ABNORMAL LOW (ref 13.0–17.0)
MCH: 32.1 pg (ref 26.0–34.0)
MCHC: 31.9 g/dL (ref 30.0–36.0)
MCV: 100.3 fL — ABNORMAL HIGH (ref 80.0–100.0)
Platelets: 159 10*3/uL (ref 150–400)
RBC: 3.12 MIL/uL — ABNORMAL LOW (ref 4.22–5.81)
RDW: 12.5 % (ref 11.5–15.5)
WBC: 18.3 10*3/uL — ABNORMAL HIGH (ref 4.0–10.5)
nRBC: 0 % (ref 0.0–0.2)

## 2023-11-17 LAB — LACTIC ACID, PLASMA
Lactic Acid, Venous: 2.3 mmol/L (ref 0.5–1.9)
Lactic Acid, Venous: 2.1 mmol/L (ref 0.5–1.9)
Lactic Acid, Venous: 2.4 mmol/L (ref 0.5–1.9)
Lactic Acid, Venous: 2.1 mmol/L (ref 0.5–1.9)

## 2023-11-17 LAB — CBG MONITORING, ED
Glucose-Capillary: 160 mg/dL — ABNORMAL HIGH (ref 70–99)
Glucose-Capillary: 195 mg/dL — ABNORMAL HIGH (ref 70–99)

## 2023-11-17 LAB — BLOOD CULTURE ID PANEL (REFLEXED) - BCID2

## 2023-11-17 LAB — URINALYSIS, ROUTINE W REFLEX MICROSCOPIC
Bilirubin Urine: NEGATIVE
Glucose, UA: 50 mg/dL — AB
Ketones, ur: 20 mg/dL — AB
Leukocytes,Ua: NEGATIVE
Nitrite: NEGATIVE
Protein, ur: 100 mg/dL — AB
Specific Gravity, Urine: 1.02 (ref 1.005–1.030)
pH: 5 (ref 5.0–8.0)

## 2023-11-17 LAB — HEMOGLOBIN A1C
Hgb A1c MFr Bld: 7.3 % — ABNORMAL HIGH (ref 4.8–5.6)
Mean Plasma Glucose: 162.81 mg/dL

## 2023-11-17 LAB — C-REACTIVE PROTEIN: CRP: 2.9 mg/dL — ABNORMAL HIGH (ref <1.0)

## 2023-11-17 LAB — IRON AND TIBC
Iron: 28 ug/dL — ABNORMAL LOW (ref 45–182)
Saturation Ratios: 8 % — ABNORMAL LOW (ref 17.9–39.5)
TIBC: 332 ug/dL (ref 250–450)
UIBC: 304 ug/dL

## 2023-11-17 LAB — PROCALCITONIN: Procalcitonin: 0.36 ng/mL

## 2023-11-17 LAB — TROPONIN I (HIGH SENSITIVITY)
Troponin I (High Sensitivity): 1286 ng/L (ref <18)
Troponin I (High Sensitivity): 2043 ng/L (ref <18)
Troponin I (High Sensitivity): 2087 ng/L (ref <18)

## 2023-11-17 LAB — FOLATE: Folate: 25.8 ng/mL (ref >5.9)

## 2023-11-17 LAB — OSMOLALITY: Osmolality: 333 mosm/kg (ref 275–295)

## 2023-11-17 LAB — D-DIMER, QUANTITATIVE: D-Dimer, Quant: 5.69 ug{FEU}/mL — ABNORMAL HIGH (ref 0.00–0.50)

## 2023-11-17 LAB — MAGNESIUM: Magnesium: 2 mg/dL (ref 1.7–2.4)

## 2023-11-17 LAB — POTASSIUM
Potassium: 5.1 mmol/L (ref 3.5–5.1)
Potassium: 4.9 mmol/L (ref 3.5–5.1)
Potassium: 4.3 mmol/L (ref 3.5–5.1)

## 2023-11-17 LAB — HEPARIN LEVEL (UNFRACTIONATED): Heparin Unfractionated: 0.55 [IU]/mL (ref 0.30–0.70)

## 2023-11-17 LAB — SEDIMENTATION RATE: Sed Rate: 15 mm/h (ref 0–16)

## 2023-11-17 LAB — BRAIN NATRIURETIC PEPTIDE: B Natriuretic Peptide: 3567.9 pg/mL — ABNORMAL HIGH (ref 0.0–100.0)

## 2023-11-17 LAB — FERRITIN: Ferritin: 73 ng/mL (ref 24–336)

## 2023-11-17 MED ORDER — PIPERACILLIN-TAZOBACTAM 3.375 G IVPB 30 MIN
2.2500 g | Freq: Three times a day (TID) | INTRAVENOUS | Status: DC
  Administered 2023-11-17: 3.375 g via INTRAVENOUS
  Filled 2023-11-17: qty 50

## 2023-11-17 MED ORDER — SODIUM CHLORIDE 0.9 % IV SOLN: INTRAVENOUS | Status: AC

## 2023-11-17 MED ORDER — HYDROMORPHONE HCL 1 MG/ML IJ SOLN: 1.0000 mg | INTRAMUSCULAR | Status: DC | PRN

## 2023-11-17 MED ORDER — ATORVASTATIN CALCIUM 40 MG PO TABS: 80.0000 mg | ORAL_TABLET | Freq: Every day | ORAL | Status: DC

## 2023-11-17 MED ORDER — ACETAMINOPHEN 325 MG PO TABS: 650.0000 mg | ORAL_TABLET | ORAL | Status: DC | PRN

## 2023-11-17 MED ORDER — CALCIUM GLUCONATE-NACL 1-0.675 GM/50ML-% IV SOLN
1.0000 g | Freq: Once | INTRAVENOUS | Status: AC
  Administered 2023-11-17: 1000 mg via INTRAVENOUS
  Filled 2023-11-17: qty 50

## 2023-11-17 MED ORDER — SODIUM BICARBONATE 8.4 % IV SOLN
50.0000 meq | Freq: Once | INTRAVENOUS | Status: AC
  Administered 2023-11-17: 50 meq via INTRAVENOUS
  Filled 2023-11-17: qty 50

## 2023-11-17 MED ORDER — ACETAMINOPHEN 650 MG RE SUPP
650.0000 mg | RECTAL | Status: DC | PRN
  Administered 2023-11-17: 650 mg via RECTAL
  Filled 2023-11-17: qty 1

## 2023-11-17 MED ORDER — CYANOCOBALAMIN 1000 MCG/ML IJ SOLN
1000.0000 ug | Freq: Once | INTRAMUSCULAR | Status: AC
  Administered 2023-11-17: 1000 ug via INTRAMUSCULAR
  Filled 2023-11-17: qty 1

## 2023-11-17 MED ORDER — ACETAMINOPHEN 160 MG/5ML PO SOLN: 650.0000 mg | ORAL | Status: DC | PRN

## 2023-11-17 MED ORDER — LORAZEPAM 2 MG/ML IJ SOLN
1.0000 mg | INTRAMUSCULAR | Status: DC | PRN
  Administered 2023-11-18: 1 mg via INTRAVENOUS
  Administered 2023-11-20: 2 mg via INTRAVENOUS
  Filled 2023-11-17 (×2): qty 1

## 2023-11-17 MED ORDER — GLYCOPYRROLATE 0.2 MG/ML IJ SOLN
0.4000 mg | INTRAMUSCULAR | Status: DC
Start: 2023-11-17 — End: 2023-11-18
  Administered 2023-11-17 – 2023-11-18 (×5): 0.4 mg via INTRAVENOUS
  Filled 2023-11-17 (×5): qty 2

## 2023-11-17 MED ORDER — STROKE: EARLY STAGES OF RECOVERY BOOK: Freq: Once | Status: DC

## 2023-11-17 MED ORDER — HEPARIN SODIUM (PORCINE) 5000 UNIT/ML IJ SOLN
5000.0000 [IU] | Freq: Three times a day (TID) | INTRAMUSCULAR | Status: DC
  Administered 2023-11-17: 5000 [IU] via SUBCUTANEOUS
  Filled 2023-11-17: qty 1

## 2023-11-17 MED ORDER — HYDROMORPHONE HCL 1 MG/ML IJ SOLN: 0.5000 mg | INTRAMUSCULAR | Status: DC | PRN

## 2023-11-17 MED ORDER — ONDANSETRON HCL 4 MG/2ML IJ SOLN
4.0000 mg | Freq: Four times a day (QID) | INTRAMUSCULAR | Status: DC | PRN
Start: 2023-11-17 — End: 2023-11-21

## 2023-11-17 MED ORDER — BIOTENE DRY MOUTH MT LIQD: 15.0000 mL | OROMUCOSAL | Status: DC | PRN

## 2023-11-17 MED ORDER — PERFLUTREN LIPID MICROSPHERE
1.0000 mL | INTRAVENOUS | Status: DC | PRN
Start: 2023-11-17 — End: 2023-11-17
  Administered 2023-11-17: 3 mL via INTRAVENOUS

## 2023-11-17 MED ORDER — HALOPERIDOL 0.5 MG PO TABS
0.5000 mg | ORAL_TABLET | ORAL | Status: DC | PRN
Start: 2023-11-17 — End: 2023-11-21

## 2023-11-17 MED ORDER — HYDROMORPHONE HCL 1 MG/ML IJ SOLN
0.5000 mg | INTRAMUSCULAR | Status: DC
  Administered 2023-11-17: 0.5 mg via INTRAVENOUS
  Filled 2023-11-17: qty 0.5

## 2023-11-17 MED ORDER — HEPARIN (PORCINE) 25000 UT/250ML-% IV SOLN
700.0000 [IU]/h | INTRAVENOUS | Status: DC
  Administered 2023-11-17: 800 [IU]/h via INTRAVENOUS
  Filled 2023-11-17: qty 250

## 2023-11-17 MED ORDER — POLYVINYL ALCOHOL 1.4 % OP SOLN: 1.0000 [drp] | Freq: Four times a day (QID) | OPHTHALMIC | Status: DC | PRN

## 2023-11-17 MED ORDER — HALOPERIDOL LACTATE 5 MG/ML IJ SOLN: 0.5000 mg | INTRAMUSCULAR | Status: DC | PRN

## 2023-11-17 MED ORDER — ONDANSETRON 4 MG PO TBDP
4.0000 mg | ORAL_TABLET | Freq: Four times a day (QID) | ORAL | Status: DC | PRN
Start: 2023-11-17 — End: 2023-11-21

## 2023-11-17 MED ORDER — MAGNESIUM SULFATE 2 GM/50ML IV SOLN
2.0000 g | Freq: Once | INTRAVENOUS | Status: AC
  Administered 2023-11-17: 2 g via INTRAVENOUS
  Filled 2023-11-17: qty 50

## 2023-11-17 MED ORDER — HALOPERIDOL LACTATE 2 MG/ML PO CONC: 0.5000 mg | ORAL | Status: DC | PRN

## 2023-11-17 MED ORDER — VANCOMYCIN HCL 750 MG IV SOLR: 750.0000 mg | INTRAVENOUS | Status: DC

## 2023-11-17 MED ORDER — SODIUM POLYSTYRENE SULFONATE 15 GM/60ML CO SUSP
30.0000 g | Freq: Once | Status: AC
  Administered 2023-11-17: 30 g via RECTAL
  Filled 2023-11-17: qty 120

## 2023-11-17 MED ORDER — SODIUM CHLORIDE 0.9 % IV SOLN: INTRAVENOUS | Status: DC

## 2023-11-17 MED ORDER — HYDROMORPHONE HCL 1 MG/ML IJ SOLN
1.0000 mg | INTRAMUSCULAR | Status: DC
  Administered 2023-11-17 – 2023-11-19 (×10): 1 mg via INTRAVENOUS
  Filled 2023-11-17 (×10): qty 1

## 2023-11-17 MED ORDER — SODIUM BICARBONATE 8.4 % IV SOLN
Freq: Once | INTRAVENOUS | Status: AC
  Filled 2023-11-17: qty 1000

## 2023-11-17 NOTE — Progress Notes (Signed)
  Echocardiogram 2D Echocardiogram has been performed.  Steve Hickman 11/17/2023, 8:48 AM

## 2023-11-17 NOTE — Consult Note (Addendum)
Cardiology Consultation   Patient ID: Steve Hickman MRN: [id number]; DOB: 1941-08-19  Admit date: 11/16/2023 Date of Consult: 11/17/2023  PCP:  Philemon Kingdom, MD   [facility] Providers Cardiologist:  Rollene Rotunda, MD        Patient Profile:   Steve Hickman is a 83 y.o. male with a hx of CAD (inferior STEMI 05/2013 with PCI Lcx followed by CABG 06/2013 LIMA-LAD, SVG-PD), LBBB, poorly controlled DM2, hypertension, hyperlipidemia who is being seen 11/17/2023 for the evaluation of elevated troponin at the request of Dr. Renaye Rakers.  History of Present Illness:   History obtained from chart review as patient unable to give a history.    Steve Hickman was last known to be at his baseline prior to 1/19 p.m.  Around 10 PM on 1/19, he called his ex-wife who noted that his speech was garbled.  His ex-wife went to see him on 1/21 AM and found him on the floor at his home aphasic.  He was unable to communicate at that time.  Vitals with EMS:  HR 56, BP 120/60, 98% RA, CBG 194. Blood pressure did drop with EMS and he was slightly hypotensive, thus he was given 200 cc of fluid by EMS prior to arrival.   Per report, he lives independently and is functional.  It is not clear exactly when his last known normal was but is presumed to be on 1/18 when he went with a friend to a baseball game in the evening.  On arrival to the emergency room, afebrile, HR 50s, BP 130-150/50-70s, not requiring supplemental oxygen.  CBC with WBC 20.7, Hgb 10.5, PLT 169.  BMP with Na 146, K 6.1, CO2 10, BUN 41, Cr 2.6, magnesium 1.1, anion gap 25.  High-sensitivity troponins 1018->1138->1286. EKG with possible CHB though a good amount of artifact, iRBBB, prolonged Qtc, and peaked T waves.  No STE.  CT head with no acute hemorrhage or infarct.  CXR with no edema or infiltrate.  In the ED, has received calcium gluconate 1g, Vanc, Zosyn, NS 2L bolus.  MRI brain with 6 mm acute ischemic nonhemorrhagic deep white matter  infarct involving the left frontal centrum semi ovale, and negative MRA head and neck for LVO.   At baseline, the patient is usually A&Ox4 and ambulatory, lives independently and is fully functional, per report.   Past Medical History:  Diagnosis Date   CAD s/p CABG x 2 (LIMA-LAD, SV-PDA) 07/07/13 (EF 60%)  05/29/2013   Diabetes mellitus (HCC) 05/31/2013   Dyslipidemia, goal LDL below 70 05/31/2013   Left bundle branch block    STEMI (ST elevation myocardial infarction)- CFX BMS, 05/28/2013 05/29/2013   STEMI     Past Surgical History:  Procedure Laterality Date   CORONARY ARTERY BYPASS GRAFT N/A 07/07/2013   Procedure: CORONARY ARTERY BYPASS GRAFTING (CABG);  Surgeon: Loreli Slot, MD;  Location: [facility] OR;  Service: Open Heart Surgery;  Laterality: N/A;  CABG X 2   LEFT HEART CATH N/A 05/28/2013   Procedure: LEFT HEART CATH;  Surgeon: Runell Gess, MD;  Location: [facility] CATH LAB;  Service: Cardiovascular;  Laterality: N/A;   NO PAST SURGERIES     PERCUTANEOUS CORONARY STENT INTERVENTION (PCI-S)  05/28/2013   Procedure: PERCUTANEOUS CORONARY STENT INTERVENTION (PCI-S);  Surgeon: Runell Gess, MD;  Location[facility] CATH LAB;  Service: Cardiovascular;;  prox cx     Home Medications:  Prior to Admission medications   Medication Sig Start Date End Date  Taking? Authorizing Provider  aspirin EC 81 MG EC tablet Take 1 tablet (81 mg total) by mouth daily. 05/31/13   Leone Brand, NP  atorvastatin (LIPITOR) 80 MG tablet TAKE 1 TABLET BY MOUTH ONCE DAILY AT 6 PM 08/11/23   Rollene Rotunda, MD  glimepiride (AMARYL) 2 MG tablet Take 2 mg by mouth daily with breakfast.    [provider]  metFORMIN (GLUCOPHAGE) 1000 MG tablet Take 1,000 mg by mouth 2 (two) times daily with a meal.    [provider]  metoprolol tartrate (LOPRESSOR) 25 MG tablet Take 1/2 (one-half) tablet by mouth twice daily Patient taking differently: Take 12.5 mg by mouth 2 (two) times daily. 08/27/23   Rollene Rotunda,  MD  pantoprazole (PROTONIX) 40 MG tablet Take 1 tablet (40 mg total) by mouth daily. 09/03/23   Rollene Rotunda, MD    Inpatient Medications: Scheduled Meds:  Continuous Infusions:  PRN Meds:   Allergies:   No Known Allergies  Social History:   Social History   Socioeconomic History   Marital status: Married    Spouse name: Not on file   Number of children: Not on file   Years of education: Not on file   Highest education level: Not on file  Occupational History   Not on file  Tobacco Use   Smoking status: Never   Smokeless tobacco: Never  Substance and Sexual Activity   Alcohol use: Yes    Alcohol/week: 1.0 standard drink of alcohol    Types: 1 Shots of liquor per week   Drug use: No   Sexual activity: Not on file  Other Topics Concern   Not on file  Social History Narrative   Not on file   Social Drivers of Health   Financial Resource Strain: Not on file  Food Insecurity: Low Risk  (11/10/2023)   Received from [facility]   Hunger Vital Sign    Worried About Running Out of Food in the Last Year: Never true    Ran Out of Food in the Last Year: Never true  Transportation Needs: No Transportation Needs (11/10/2023)   Received from [facility]    In the past 12 months, has lack of reliable transportation kept you from medical appointments, meetings, work or from getting things needed for daily living? : No  Physical Activity: Not on file  Stress: Not on file  Social Connections: Unknown (03/11/2022)   Received from [facility], [facility]   Social Network    Social Network: Not on file  Intimate Partner Violence: Unknown (01/31/2022)   Received from [employer], [facility]   HITS    Physically Hurt: Not on file    Insult or Talk Down To: Not on file    Threaten Physical Harm: Not on file    Scream or Curse: Not on file    Family History:    Family History  Problem Relation Age of Onset   Healthy Mother        NO CARDIAC  RELATED HEALTH ISSUES    Healthy Father        NO CARDIAC RELATED HEALTH ISSUES    Stroke Father    Healthy Sister        NO CARDIAC RELATED HEALTH ISSUES    Heart attack Neg Hx    Hypertension Neg Hx      ROS:  Please see the history of present illness.   All other ROS reviewed and negative.  Physical Exam/Data:   Vitals:   11/16/23 2330 11/16/23 2345 11/17/23 0007 11/17/23 0135  BP: (!) 152/59 139/70  (!) 177/71  Pulse: (!) 55 (!) 55  62  Resp: (!) 25 (!) 26  16  Temp:   98.2 F (36.8 C)   TempSrc:   Temporal   SpO2: 100% 100%  99%    Intake/Output Summary (Last 24 hours) at 11/17/2023 0216 Last data filed at 11/16/2023 2139 Gross per 24 hour  Intake 2000 ml  Output --  Net 2000 ml      07/15/2023    3:40 PM 02/17/2022    3:02 PM 11/19/2021    3:33 PM  Last 3 Weights  Weight (lbs) 138 lb 12.8 oz 134 lb 6.4 oz 136 lb  Weight (kg) 62.959 kg 60.963 kg 61.689 kg     There is no height or weight on file to calculate BMI.  General:  disheveled, not responding to questions HEENT: poor dentition Neck: no JVD Cardiac:  regular and bradycardic without murmur Lungs:  mildly increased work of breathing without crackles or wheezes anteriorly Abd: soft, nontender Ext: no edema Skin: warm and dry  Neuro:  left gaze preference, L arm contracted, receptive and expressive aphasia, not moving any extremities to command, L arm rigidity Psych:  not able to assess  EKG:  The EKG was personally reviewed and demonstrates:  EKG with possible CHB though a good amount of artifact, iRBBB, prolonged Qtc, and peaked T waves.  No STE. Telemetry:  Telemetry was personally reviewed and demonstrates:  Fixed atrial rate 110-120 and fixed ventricular rate in the 50s  Relevant CV Studies: 07/2023 echo IMPRESSIONS     1. Left ventricular ejection fraction, by estimation, is 55 to 60%. The  left ventricle has normal function. The left ventricle has no regional  wall motion abnormalities.  There is mild left ventricular hypertrophy.  Left ventricular diastolic parameters  are consistent with Grade I diastolic dysfunction (impaired relaxation).  The average left ventricular global longitudinal strain is -13.1 %. The  global longitudinal strain is abnormal.   2. Right ventricular systolic function is normal. The right ventricular  size is normal. There is normal pulmonary artery systolic pressure.   3. The mitral valve is normal in structure. No evidence of mitral valve  regurgitation. No evidence of mitral stenosis.   4. The aortic valve is calcified. There is mild calcification of the  aortic valve. There is mild thickening of the aortic valve. Aortic valve  regurgitation is not visualized. Aortic valve sclerosis/calcification is  present, without any evidence of  aortic stenosis.   5. The inferior vena cava is normal in size with greater than 50%  respiratory variability, suggesting right atrial pressure of 3 mmHg.   Laboratory Data:  High Sensitivity Troponin:   Recent Labs  Lab 11/16/23 1937 11/16/23 2140 11/16/23 2352  TROPONINIHS 1,018* 1,138* 1,286*     Chemistry Recent Labs  Lab 11/16/23 1937 11/16/23 2352  NA 146* 143  K 6.1* 5.8*  CL 111 113*  CO2 10* 9*  GLUCOSE 170* 166*  BUN 41* 42*  CREATININE 2.86* 2.75*  CALCIUM 8.5* 7.8*  MG 1.1*  --   GFRNONAA 21* 22*  ANIONGAP 25* 21*    No results for input(s): "PROT", "ALBUMIN", "AST", "ALT", "ALKPHOS", "BILITOT" in the last 168 hours. Lipids  Recent Labs  Lab 11/16/23 1937  CHOL 77  TRIG 104  HDL 36*  LDLCALC 20  CHOLHDL 2.1  Hematology Recent Labs  Lab 11/16/23 1937  WBC 20.7*  RBC 3.32*  HGB 10.5*  HCT 36.1*  MCV 108.7*  MCH 31.6  MCHC 29.1*  RDW 12.2  PLT 169   Thyroid No results for input(s): "TSH", "FREET4" in the last 168 hours.  BNPNo results for input(s): "BNP", "PROBNP" in the last 168 hours.  DDimer No results for input(s): "DDIMER" in the last 168  hours.   Radiology/Studies:  MR BRAIN WO CONTRAST Result Date: 11/17/2023 CLINICAL DATA:  Initial evaluation for neuro deficit, stroke suspected. EXAM: MRI HEAD WITHOUT CONTRAST MRA HEAD WITHOUT CONTRAST MRA NECK WITHOUT CONTRAST TECHNIQUE: Multiplanar, multiecho pulse sequences of the brain and surrounding structures were obtained without intravenous contrast. Angiographic images of the Circle of Willis were obtained using MRA technique without intravenous contrast. Angiographic images of the neck were obtained using MRA technique without intravenous contrast. Carotid stenosis measurements (when applicable) are obtained utilizing NASCET criteria, using the distal internal carotid diameter as the denominator. COMPARISON:  CT from 11/16/2023. FINDINGS: MRI HEAD FINDINGS Brain: Examination moderately degraded by motion artifact. Diffuse prominence of the CSF containing spaces compatible with generalized cerebral atrophy. Patchy T2/FLAIR hyperintensity involving the periventricular and deep white matter both cerebral hemispheres, consistent chronic small vessel ischemic disease, mild in nature. 6 mm focus of restricted diffusion involving the deep white matter of the left frontal centrum semi ovale, consistent with a small acute ischemic infarct (series 5, image 39). No associated hemorrhage or mass effect. No other evidence for acute or subacute ischemia. Note made of mild diffusion signal abnormality involving the parasagittal right frontal lobe, felt to be consistent with artifact due to adjacent dural calcifications. Gray-white matter differentiation maintained. No acute intracranial hemorrhage. A small focus of chronic hemosiderin staining noted at the right occipital lobe (series 9, image 50). No mass lesion, midline shift or mass effect. No hydrocephalus or extra-axial fluid collection. Pituitary gland and suprasellar region within normal limits. Vascular: Major intracranial vascular flow voids are  maintained. Skull and upper cervical spine: Craniocervical junction within normal limits. Bone marrow signal intensity normal. Small calcified nodular density noted at the right frontal scalp, of doubtful significance. Sinuses/Orbits: Prior ocular lens replacement on the right. Paranasal sinuses are largely clear. Trace left mastoid effusion noted, of doubtful significance. Other: None. MRA HEAD FINDINGS ANTERIOR CIRCULATION: Examination moderately degraded by motion artifact. Both internal carotid arteries are patent through the siphons without significant stenosis or other abnormality. A1 segments patent bilaterally. Grossly normal anterior communicating artery complex. Both ACAs grossly patent without visible stenosis. No M1 stenosis. No visible proximal MCA branch occlusion. Distal MCA branches grossly perfused and symmetric. POSTERIOR CIRCULATION: Both V4 segments patent without stenosis. Left vertebral artery slightly dominant. Left PICA patent. Right PICA not seen. Basilar patent without stenosis. Superior cerebellar and posterior cerebral arteries patent bilaterally. No intracranial aneurysm. MRA NECK FINDINGS AORTIC ARCH: Examination moderately to severely degraded by motion artifact. Visualized aortic arch grossly within normal limits for caliber. Origin of the great vessels not well assessed on this exam. RIGHT CAROTID SYSTEM: Right common and internal carotid arteries are patent with antegrade flow. No visible hemodynamically significant stenosis about the right carotid artery system. LEFT CAROTID SYSTEM: Left common and internal carotid arteries are patent with antegrade flow. No visible hemodynamically significant stenosis about the left carotid artery system. VERTEBRAL ARTERIES: Both vertebral arteries arise from subclavian arteries. Left vertebral artery dominant. Vertebral arteries are patent with antegrade flow. No visible hemodynamically significant stenosis. Other: None. IMPRESSION: MRI  HEAD: 1.  Motion degraded exam. 2. 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 3. Underlying cerebral atrophy with mild chronic small vessel ischemic disease. MRA HEAD: 1. Motion degraded exam. 2. Negative intracranial MRA for large vessel occlusion. No hemodynamically significant or correctable stenosis. MRA NECK: 1. Motion degraded exam. 2. Negative MRA of the neck for large vessel occlusion. No visible hemodynamically significant stenosis. Electronically Signed   By: Rise Mu M.D.   On: 11/17/2023 01:32   MR ANGIO HEAD WO CONTRAST Result Date: 11/17/2023 CLINICAL DATA:  Initial evaluation for neuro deficit, stroke suspected. EXAM: MRI HEAD WITHOUT CONTRAST MRA HEAD WITHOUT CONTRAST MRA NECK WITHOUT CONTRAST TECHNIQUE: Multiplanar, multiecho pulse sequences of the brain and surrounding structures were obtained without intravenous contrast. Angiographic images of the Circle of Willis were obtained using MRA technique without intravenous contrast. Angiographic images of the neck were obtained using MRA technique without intravenous contrast. Carotid stenosis measurements (when applicable) are obtained utilizing NASCET criteria, using the distal internal carotid diameter as the denominator. COMPARISON:  CT from 11/16/2023. FINDINGS: MRI HEAD FINDINGS Brain: Examination moderately degraded by motion artifact. Diffuse prominence of the CSF containing spaces compatible with generalized cerebral atrophy. Patchy T2/FLAIR hyperintensity involving the periventricular and deep white matter both cerebral hemispheres, consistent chronic small vessel ischemic disease, mild in nature. 6 mm focus of restricted diffusion involving the deep white matter of the left frontal centrum semi ovale, consistent with a small acute ischemic infarct (series 5, image 39). No associated hemorrhage or mass effect. No other evidence for acute or subacute ischemia. Note made of  mild diffusion signal abnormality involving the parasagittal right frontal lobe, felt to be consistent with artifact due to adjacent dural calcifications. Gray-white matter differentiation maintained. No acute intracranial hemorrhage. A small focus of chronic hemosiderin staining noted at the right occipital lobe (series 9, image 50). No mass lesion, midline shift or mass effect. No hydrocephalus or extra-axial fluid collection. Pituitary gland and suprasellar region within normal limits. Vascular: Major intracranial vascular flow voids are maintained. Skull and upper cervical spine: Craniocervical junction within normal limits. Bone marrow signal intensity normal. Small calcified nodular density noted at the right frontal scalp, of doubtful significance. Sinuses/Orbits: Prior ocular lens replacement on the right. Paranasal sinuses are largely clear. Trace left mastoid effusion noted, of doubtful significance. Other: None. MRA HEAD FINDINGS ANTERIOR CIRCULATION: Examination moderately degraded by motion artifact. Both internal carotid arteries are patent through the siphons without significant stenosis or other abnormality. A1 segments patent bilaterally. Grossly normal anterior communicating artery complex. Both ACAs grossly patent without visible stenosis. No M1 stenosis. No visible proximal MCA branch occlusion. Distal MCA branches grossly perfused and symmetric. POSTERIOR CIRCULATION: Both V4 segments patent without stenosis. Left vertebral artery slightly dominant. Left PICA patent. Right PICA not seen. Basilar patent without stenosis. Superior cerebellar and posterior cerebral arteries patent bilaterally. No intracranial aneurysm. MRA NECK FINDINGS AORTIC ARCH: Examination moderately to severely degraded by motion artifact. Visualized aortic arch grossly within normal limits for caliber. Origin of the great vessels not well assessed on this exam. RIGHT CAROTID SYSTEM: Right common and internal carotid arteries  are patent with antegrade flow. No visible hemodynamically significant stenosis about the right carotid artery system. LEFT CAROTID SYSTEM: Left common and internal carotid arteries are patent with antegrade flow. No visible hemodynamically significant stenosis about the left carotid artery system. VERTEBRAL ARTERIES: Both vertebral arteries arise from subclavian arteries. Left vertebral artery dominant.  Vertebral arteries are patent with antegrade flow. No visible hemodynamically significant stenosis. Other: None. IMPRESSION: MRI HEAD: 1. Motion degraded exam. 2. 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 3. Underlying cerebral atrophy with mild chronic small vessel ischemic disease. MRA HEAD: 1. Motion degraded exam. 2. Negative intracranial MRA for large vessel occlusion. No hemodynamically significant or correctable stenosis. MRA NECK: 1. Motion degraded exam. 2. Negative MRA of the neck for large vessel occlusion. No visible hemodynamically significant stenosis. Electronically Signed   By: Rise Mu M.D.   On: 11/17/2023 01:32   MR ANGIO NECK WO CONTRAST Result Date: 11/17/2023 CLINICAL DATA:  Initial evaluation for neuro deficit, stroke suspected. EXAM: MRI HEAD WITHOUT CONTRAST MRA HEAD WITHOUT CONTRAST MRA NECK WITHOUT CONTRAST TECHNIQUE: Multiplanar, multiecho pulse sequences of the brain and surrounding structures were obtained without intravenous contrast. Angiographic images of the Circle of Willis were obtained using MRA technique without intravenous contrast. Angiographic images of the neck were obtained using MRA technique without intravenous contrast. Carotid stenosis measurements (when applicable) are obtained utilizing NASCET criteria, using the distal internal carotid diameter as the denominator. COMPARISON:  CT from 11/16/2023. FINDINGS: MRI HEAD FINDINGS Brain: Examination moderately degraded by motion artifact.  Diffuse prominence of the CSF containing spaces compatible with generalized cerebral atrophy. Patchy T2/FLAIR hyperintensity involving the periventricular and deep white matter both cerebral hemispheres, consistent chronic small vessel ischemic disease, mild in nature. 6 mm focus of restricted diffusion involving the deep white matter of the left frontal centrum semi ovale, consistent with a small acute ischemic infarct (series 5, image 39). No associated hemorrhage or mass effect. No other evidence for acute or subacute ischemia. Note made of mild diffusion signal abnormality involving the parasagittal right frontal lobe, felt to be consistent with artifact due to adjacent dural calcifications. Gray-white matter differentiation maintained. No acute intracranial hemorrhage. A small focus of chronic hemosiderin staining noted at the right occipital lobe (series 9, image 50). No mass lesion, midline shift or mass effect. No hydrocephalus or extra-axial fluid collection. Pituitary gland and suprasellar region within normal limits. Vascular: Major intracranial vascular flow voids are maintained. Skull and upper cervical spine: Craniocervical junction within normal limits. Bone marrow signal intensity normal. Small calcified nodular density noted at the right frontal scalp, of doubtful significance. Sinuses/Orbits: Prior ocular lens replacement on the right. Paranasal sinuses are largely clear. Trace left mastoid effusion noted, of doubtful significance. Other: None. MRA HEAD FINDINGS ANTERIOR CIRCULATION: Examination moderately degraded by motion artifact. Both internal carotid arteries are patent through the siphons without significant stenosis or other abnormality. A1 segments patent bilaterally. Grossly normal anterior communicating artery complex. Both ACAs grossly patent without visible stenosis. No M1 stenosis. No visible proximal MCA branch occlusion. Distal MCA branches grossly perfused and symmetric. POSTERIOR  CIRCULATION: Both V4 segments patent without stenosis. Left vertebral artery slightly dominant. Left PICA patent. Right PICA not seen. Basilar patent without stenosis. Superior cerebellar and posterior cerebral arteries patent bilaterally. No intracranial aneurysm. MRA NECK FINDINGS AORTIC ARCH: Examination moderately to severely degraded by motion artifact. Visualized aortic arch grossly within normal limits for caliber. Origin of the great vessels not well assessed on this exam. RIGHT CAROTID SYSTEM: Right common and internal carotid arteries are patent with antegrade flow. No visible hemodynamically significant stenosis about the right carotid artery system. LEFT CAROTID SYSTEM: Left common and internal carotid arteries are patent with antegrade flow. No visible hemodynamically significant stenosis about the left carotid  artery system. VERTEBRAL ARTERIES: Both vertebral arteries arise from subclavian arteries. Left vertebral artery dominant. Vertebral arteries are patent with antegrade flow. No visible hemodynamically significant stenosis. Other: None. IMPRESSION: MRI HEAD: 1. Motion degraded exam. 2. 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 3. Underlying cerebral atrophy with mild chronic small vessel ischemic disease. MRA HEAD: 1. Motion degraded exam. 2. Negative intracranial MRA for large vessel occlusion. No hemodynamically significant or correctable stenosis. MRA NECK: 1. Motion degraded exam. 2. Negative MRA of the neck for large vessel occlusion. No visible hemodynamically significant stenosis. Electronically Signed   By: Rise Mu M.D.   On: 11/17/2023 01:32   CT Head Wo Contrast Result Date: 11/16/2023 CLINICAL DATA:  Neuro deficit, acute, stroke suspected right sided weakness EXAM: CT HEAD WITHOUT CONTRAST TECHNIQUE: Contiguous axial images were obtained from the base of the skull through the vertex without intravenous  contrast. RADIATION DOSE REDUCTION: This exam was performed according to the departmental dose-optimization program which includes automated exposure control, adjustment of the mA and/or kV according to patient size and/or use of iterative reconstruction technique. COMPARISON:  None Available. FINDINGS: Brain: Normal anatomic configuration. Parenchymal volume loss is commensurate with the patient's age. Mild periventricular white matter changes are present likely reflecting the sequela of small vessel ischemia. No abnormal intra or extra-axial mass lesion or fluid collection. No abnormal mass effect or midline shift. No evidence of acute intracranial hemorrhage or infarct. Ventricular size is normal. Cerebellum unremarkable. Vascular: No asymmetric hyperdense vasculature at the skull base. Skull: Intact Sinuses/Orbits: Paranasal sinuses are clear. Orbits are unremarkable. Other: Mastoid air cells and middle ear cavities are clear. IMPRESSION: 1. No acute intracranial hemorrhage or infarct. 2. Mild senescent change. Electronically Signed   By: Helyn Numbers M.D.   On: 11/16/2023 20:51   DG Chest Portable 1 View Result Date: 11/16/2023 CLINICAL DATA:  Aspiration evaluation. EXAM: PORTABLE CHEST 1 VIEW COMPARISON:  07/02/2022 FINDINGS: Prior median sternotomy and CABG. Mild cardiomegaly. Mediastinal contours within normal limits. Aortic atherosclerosis. No confluent airspace opacities or effusions. No acute bony abnormality. IMPRESSION: Cardiomegaly.  No active disease. Electronically Signed   By: Charlett Nose M.D.   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  MRI brain with 6 mm acute ischemic nonhemorrhagic deep white matter infarct involving the left frontal centrum semi ovale, and negative MRA head and neck for LVO.  Has known CAD with  remote CABG.  High sensitivity troponin elevated 1018->1138->1286 on arrival.  Unable to state if having CP.  No chest pain in the outpatient setting recently and normal echo in 09/2023.  On arrival, also found: hyperkalemia, elevated CK concerning for rhabdomyolysis, hypernatremia, AKI on CKD.  More likely that elevated troponin is related to a type II MI in the setting of the multiple metabolic derangements though unable to definitively rule out type I event.  Possible CHB on EKG which is more clear on telemetry with fixed atrial rate in the 120s and ventricular rate in the 50s with an incomplete RBBB morphology versus LBBB morphology that was seen on prior EKG.  Also possible that CHB is related to metabolic disturbances as well, though would have expected hyperkalemia to be more severe.  Due to risk of hemorrhagic conversion and unclear clinical picture (outside  of acute CVA), would not start IV heparin at this time for type 1 NSTEMI.  He would additionally not be a good candidate for cardiac catheterization at this time. - remain on telemetry - Repeat EKG - keep K>4 and mag >2 - continue troponin trend q2h - no IV heparin at this time - agree with aspirin + plavix per neurology - echo in AM  0451 addendum: troponin trend continues to rise when it had appeared to be approaching a pleateau.  Per neurology, okay to start heparin due to smaller size of stroke.  Recommend starting heparin at this time with ongoing workup as described above.  Hyperkalemia Likely Rhabdomyolysis Elevated CK in the setting of being found down.  No UA to confirm rhabdo yet but strong suspicion.  Suspect hyperkalemia is related. - treatment of hyperkalemia per primary - agree with volume resuscitation for treatment of rhabdo  Hypernatremia Likely due to intravascular volume depletion - fluids as above  AKI on CKD - Likely due to intravascular volume depletion and heme pigment induced nephropathy  Concern for  sepsis - per primary  Type II DM - per primary  Risk Assessment/Risk Scores:             For questions or updates, please contact [facility] Please consult [email or web address] for contact info under    Signed, Aundra Dubin, MD  11/17/2023 2:16 AM   Attending Attestation   Patient seen and examined, note reviewed with the Provider. I personally reviewed laboratory data, imaging studies and relevant notes. I independently examined the patient and formulated the important aspects of the plan. I have personally discussed the plan with the patient and/or family. Comments or changes to the note/plan are indicated below.  My Exam:  General: Ill-appearing Head: Atraumatic, normal size  Eyes: PEERLA, EOMI  Neck: Supple, no JVD Endocrine: No thryomegaly Cardiac: Normal S1, S2; irregular rhythm, 2 out of 6 systolic ejection murmur Lungs: Diminished breath sounds bilaterally Abd: Soft, nontender, no hepatomegaly  Ext: No edema, pulses 2+ Musculoskeletal: No deformities, BUE and BLE strength normal and equal Skin: Warm and dry, no rashes   Neuro: Alert, awake, not answering questions or following commands  Telemetry: Junctional rhythm heart rate 50 to 60 bpm EKG: Junctional rhythm heart rate 56 bpm versus complete heart block Echo: Pending Cath: None recent Brain MRI: 6 mm acute ischemic infarct deep white matter  Assessment & Plan: 83 year old male with history of CAD status post CABG in 2014, aortic sclerosis, diabetes, paroxysmal atrial fibrillation admitted on 11/16/2023 for confusion/encephalopathy after being found down.  Workup has revealed a small lacunar infarct.  Cardiology was consulted for junctional rhythm and elevated troponin.  The patient also has rhabdomyolysis and acute kidney injury with significant hyperkalemia.  # Elevated troponin, demand ischemia # CAD status post CABG -Patient is currently admitted for an acute renal failure with rhabdomyolysis and  hyperkalemia.  Troponins are minimally elevated.  The patient was found down is lethargic and also has an acute stroke.  This does not appear to represent an acute coronary syndrome. -Okay to continue heparin as I am uncertain if he has atrial fibrillation or not.  Will continue this for now. -No beta-blocker.  Follow-up echo.  Not a candidate for invasive angiography.  Also likely not needed. -We will treat his cardiac conditions conservatively.  # Junctional rhythm versus atrial fibrillation with slow ventricular response -History of A-fib after CABG surgery in 2014.  Now here with stroke and altered mentation. -  Hemodynamically he appears stable.  No indications for pacing. -Hold AV nodal agents.  Follow-up echo. -Hopefully if this is a junctional rhythm it will improve with improvement in metabolic derangements.  # Acute renal failure # Hyperkalemia # Metabolic acidosis # Rhabdomyolysis # Acute stroke # Sepsis -Unclear what is going on.  Suspect his medical issues are driving his cardiovascular issues.  Would treat these conservatively as you are doing.  Signed, Lenna Gilford. Flora Lipps, MD [facility]  [facility]  11/17/2023 9:03 AM

## 2023-11-17 NOTE — Progress Notes (Addendum)
STROKE TEAM PROGRESS NOTE   BRIEF HPI [name] is a 83 y.o. male with PMH significant for CAD s/p CABG x 2 (LIMA-LAD, SV-PDA) 07/07/13 (EF 60%), DM, Dyslipidemia, LBBB and prior STEMI who was BIB [facility] from home 1/20 for AMS after being found down aphasic and contracted on right side. BP dropped en route and he was given 200cc IV fluids.  CT showed no hemorrhage or infarct.  MRI shows left frontal infarct, no LVO on MRA  Patient suspected of hacing STEMI when brought in also with increasing tropnins. Placed on Heparin due to benefits outweighing risks of hemorrhagic conversion.   LKW: Saturday pm mRS: 0 tNKASE: No, outside of window Thrombectomy: No, outside of window NIH on Admission 27  INTERIM HISTORY/SUBJECTIVE  Patient noted to be in acute respiratory distress on examination this morning.  Palliative care was consulted and spoke with patient's wife.  He is being transition to comfort care with no further interventions desired.  Patient's prognosis is very poor, neurology agrees with comfort care as appropriate action.  Due to this transition, we will not order any additional imaging.   OBJECTIVE  CBC    Component Value Date/Time   WBC 18.3 (H) 11/17/2023 0322   RBC 3.12 (L) 11/17/2023 0322   RBC 3.07 (L) 11/17/2023 0322   HGB 10.0 (L) 11/17/2023 0322   HGB 12.9 (L) 11/19/2021 1648   HCT 31.3 (L) 11/17/2023 0322   HCT 37.6 11/19/2021 1648   PLT 159 11/17/2023 0322   PLT 215 11/19/2021 1648   MCV 100.3 (H) 11/17/2023 0322   MCV 94 11/19/2021 1648   MCH 32.1 11/17/2023 0322   MCHC 31.9 11/17/2023 0322   RDW 12.5 11/17/2023 0322   RDW 12.0 11/19/2021 1648   LYMPHSABS 1.5 11/16/2023 1937   MONOABS 1.5 (H) 11/16/2023 1937   EOSABS 0.0 11/16/2023 1937   BASOSABS 0.0 11/16/2023 1937    BMET    Component Value Date/Time   NA 145 11/17/2023 0322   NA 142 09/09/2023 1417   K 4.9 11/17/2023 0729   CL 111 11/17/2023 0322   CO2 10 (L) 11/17/2023 0322    GLUCOSE 174 (H) 11/17/2023 0322   BUN 45 (H) 11/17/2023 0322   BUN 15 09/09/2023 1417   CREATININE 2.84 (H) 11/17/2023 0322   CREATININE 0.83 12/07/2014 1140   CALCIUM 8.1 (L) 11/17/2023 0322   EGFR 47 (L) 09/09/2023 1417   GFRNONAA 21 (L) 11/17/2023 0322    IMAGING past 24 hours US RENAL Result Date: 11/17/2023 CLINICAL DATA:  AKI EXAM: RENAL / URINARY TRACT ULTRASOUND COMPLETE COMPARISON:  None Available. FINDINGS: Right Kidney: Renal measurements: 8.4 x 5.5 x 3.7 cm = volume: 90 mL. Echogenicity within normal limits. No mass or hydronephrosis visualized. Left Kidney: Renal measurements: 9.4 x 5.0 x 3.9 cm = volume: 95 mL. Echogenicity within normal limits. No mass or hydronephrosis visualized. Bladder: Appears normal for degree of bladder distention. Prostate indents the bladder floor. Other: None. IMPRESSION: Unremarkable ultrasound of the kidneys. Electronically Signed   By: Minerva Fester M.D.   On: 11/17/2023 03:55   MR BRAIN WO CONTRAST Result Date: 11/17/2023 CLINICAL DATA:  Initial evaluation for neuro deficit, stroke suspected. EXAM: MRI HEAD WITHOUT CONTRAST MRA HEAD WITHOUT CONTRAST MRA NECK WITHOUT CONTRAST TECHNIQUE: Multiplanar, multiecho pulse sequences of the brain and surrounding structures were obtained without intravenous contrast. Angiographic images of the Circle of Willis were obtained using MRA technique without intravenous contrast. Angiographic images of the  neck were obtained using MRA technique without intravenous contrast. Carotid stenosis measurements (when applicable) are obtained utilizing NASCET criteria, using the distal internal carotid diameter as the denominator. COMPARISON:  CT from 11/16/2023. FINDINGS: MRI HEAD FINDINGS Brain: Examination moderately degraded by motion artifact. Diffuse prominence of the CSF containing spaces compatible with generalized cerebral atrophy. Patchy T2/FLAIR hyperintensity involving the periventricular and deep white matter both  cerebral hemispheres, consistent chronic small vessel ischemic disease, mild in nature. 6 mm focus of restricted diffusion involving the deep white matter of the left frontal centrum semi ovale, consistent with a small acute ischemic infarct (series 5, image 39). No associated hemorrhage or mass effect. No other evidence for acute or subacute ischemia. Note made of mild diffusion signal abnormality involving the parasagittal right frontal lobe, felt to be consistent with artifact due to adjacent dural calcifications. Gray-white matter differentiation maintained. No acute intracranial hemorrhage. A small focus of chronic hemosiderin staining noted at the right occipital lobe (series 9, image 50). No mass lesion, midline shift or mass effect. No hydrocephalus or extra-axial fluid collection. Pituitary gland and suprasellar region within normal limits. Vascular: Major intracranial vascular flow voids are maintained. Skull and upper cervical spine: Craniocervical junction within normal limits. Bone marrow signal intensity normal. Small calcified nodular density noted at the right frontal scalp, of doubtful significance. Sinuses/Orbits: Prior ocular lens replacement on the right. Paranasal sinuses are largely clear. Trace left mastoid effusion noted, of doubtful significance. Other: None. MRA HEAD FINDINGS ANTERIOR CIRCULATION: Examination moderately degraded by motion artifact. Both internal carotid arteries are patent through the siphons without significant stenosis or other abnormality. A1 segments patent bilaterally. Grossly normal anterior communicating artery complex. Both ACAs grossly patent without visible stenosis. No M1 stenosis. No visible proximal MCA branch occlusion. Distal MCA branches grossly perfused and symmetric. POSTERIOR CIRCULATION: Both V4 segments patent without stenosis. Left vertebral artery slightly dominant. Left PICA patent. Right PICA not seen. Basilar patent without stenosis. Superior  cerebellar and posterior cerebral arteries patent bilaterally. No intracranial aneurysm. MRA NECK FINDINGS AORTIC ARCH: Examination moderately to severely degraded by motion artifact. Visualized aortic arch grossly within normal limits for caliber. Origin of the great vessels not well assessed on this exam. RIGHT CAROTID SYSTEM: Right common and internal carotid arteries are patent with antegrade flow. No visible hemodynamically significant stenosis about the right carotid artery system. LEFT CAROTID SYSTEM: Left common and internal carotid arteries are patent with antegrade flow. No visible hemodynamically significant stenosis about the left carotid artery system. VERTEBRAL ARTERIES: Both vertebral arteries arise from subclavian arteries. Left vertebral artery dominant. Vertebral arteries are patent with antegrade flow. No visible hemodynamically significant stenosis. Other: None. IMPRESSION: MRI HEAD: 1. Motion degraded exam. 2. 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 3. Underlying cerebral atrophy with mild chronic small vessel ischemic disease. MRA HEAD: 1. Motion degraded exam. 2. Negative intracranial MRA for large vessel occlusion. No hemodynamically significant or correctable stenosis. MRA NECK: 1. Motion degraded exam. 2. Negative MRA of the neck for large vessel occlusion. No visible hemodynamically significant stenosis. Electronically Signed   By: Rise Mu M.D.   On: 11/17/2023 01:32   MR ANGIO HEAD WO CONTRAST Result Date: 11/17/2023 CLINICAL DATA:  Initial evaluation for neuro deficit, stroke suspected. EXAM: MRI HEAD WITHOUT CONTRAST MRA HEAD WITHOUT CONTRAST MRA NECK WITHOUT CONTRAST TECHNIQUE: Multiplanar, multiecho pulse sequences of the brain and surrounding structures were obtained without intravenous contrast. Angiographic images of the  Circle of Willis were obtained using MRA technique without intravenous  contrast. Angiographic images of the neck were obtained using MRA technique without intravenous contrast. Carotid stenosis measurements (when applicable) are obtained utilizing NASCET criteria, using the distal internal carotid diameter as the denominator. COMPARISON:  CT from 11/16/2023. FINDINGS: MRI HEAD FINDINGS Brain: Examination moderately degraded by motion artifact. Diffuse prominence of the CSF containing spaces compatible with generalized cerebral atrophy. Patchy T2/FLAIR hyperintensity involving the periventricular and deep white matter both cerebral hemispheres, consistent chronic small vessel ischemic disease, mild in nature. 6 mm focus of restricted diffusion involving the deep white matter of the left frontal centrum semi ovale, consistent with a small acute ischemic infarct (series 5, image 39). No associated hemorrhage or mass effect. No other evidence for acute or subacute ischemia. Note made of mild diffusion signal abnormality involving the parasagittal right frontal lobe, felt to be consistent with artifact due to adjacent dural calcifications. Gray-white matter differentiation maintained. No acute intracranial hemorrhage. A small focus of chronic hemosiderin staining noted at the right occipital lobe (series 9, image 50). No mass lesion, midline shift or mass effect. No hydrocephalus or extra-axial fluid collection. Pituitary gland and suprasellar region within normal limits. Vascular: Major intracranial vascular flow voids are maintained. Skull and upper cervical spine: Craniocervical junction within normal limits. Bone marrow signal intensity normal. Small calcified nodular density noted at the right frontal scalp, of doubtful significance. Sinuses/Orbits: Prior ocular lens replacement on the right. Paranasal sinuses are largely clear. Trace left mastoid effusion noted, of doubtful significance. Other: None. MRA HEAD FINDINGS ANTERIOR CIRCULATION: Examination moderately degraded by motion  artifact. Both internal carotid arteries are patent through the siphons without significant stenosis or other abnormality. A1 segments patent bilaterally. Grossly normal anterior communicating artery complex. Both ACAs grossly patent without visible stenosis. No M1 stenosis. No visible proximal MCA branch occlusion. Distal MCA branches grossly perfused and symmetric. POSTERIOR CIRCULATION: Both V4 segments patent without stenosis. Left vertebral artery slightly dominant. Left PICA patent. Right PICA not seen. Basilar patent without stenosis. Superior cerebellar and posterior cerebral arteries patent bilaterally. No intracranial aneurysm. MRA NECK FINDINGS AORTIC ARCH: Examination moderately to severely degraded by motion artifact. Visualized aortic arch grossly within normal limits for caliber. Origin of the great vessels not well assessed on this exam. RIGHT CAROTID SYSTEM: Right common and internal carotid arteries are patent with antegrade flow. No visible hemodynamically significant stenosis about the right carotid artery system. LEFT CAROTID SYSTEM: Left common and internal carotid arteries are patent with antegrade flow. No visible hemodynamically significant stenosis about the left carotid artery system. VERTEBRAL ARTERIES: Both vertebral arteries arise from subclavian arteries. Left vertebral artery dominant. Vertebral arteries are patent with antegrade flow. No visible hemodynamically significant stenosis. Other: None. IMPRESSION: MRI HEAD: 1. Motion degraded exam. 2. 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 3. Underlying cerebral atrophy with mild chronic small vessel ischemic disease. MRA HEAD: 1. Motion degraded exam. 2. Negative intracranial MRA for large vessel occlusion. No hemodynamically significant or correctable stenosis. MRA NECK: 1. Motion degraded exam. 2. Negative MRA of the neck for large vessel occlusion. No visible  hemodynamically significant stenosis. Electronically Signed   By: Rise Mu M.D.   On: 11/17/2023 01:32   MR ANGIO NECK WO CONTRAST Result Date: 11/17/2023 CLINICAL DATA:  Initial evaluation for neuro deficit, stroke suspected. EXAM: MRI HEAD WITHOUT CONTRAST MRA HEAD WITHOUT CONTRAST MRA NECK WITHOUT CONTRAST TECHNIQUE: Multiplanar, multiecho pulse  sequences of the brain and surrounding structures were obtained without intravenous contrast. Angiographic images of the Circle of Willis were obtained using MRA technique without intravenous contrast. Angiographic images of the neck were obtained using MRA technique without intravenous contrast. Carotid stenosis measurements (when applicable) are obtained utilizing NASCET criteria, using the distal internal carotid diameter as the denominator. COMPARISON:  CT from 11/16/2023. FINDINGS: MRI HEAD FINDINGS Brain: Examination moderately degraded by motion artifact. Diffuse prominence of the CSF containing spaces compatible with generalized cerebral atrophy. Patchy T2/FLAIR hyperintensity involving the periventricular and deep white matter both cerebral hemispheres, consistent chronic small vessel ischemic disease, mild in nature. 6 mm focus of restricted diffusion involving the deep white matter of the left frontal centrum semi ovale, consistent with a small acute ischemic infarct (series 5, image 39). No associated hemorrhage or mass effect. No other evidence for acute or subacute ischemia. Note made of mild diffusion signal abnormality involving the parasagittal right frontal lobe, felt to be consistent with artifact due to adjacent dural calcifications. Gray-white matter differentiation maintained. No acute intracranial hemorrhage. A small focus of chronic hemosiderin staining noted at the right occipital lobe (series 9, image 50). No mass lesion, midline shift or mass effect. No hydrocephalus or extra-axial fluid collection. Pituitary gland and suprasellar  region within normal limits. Vascular: Major intracranial vascular flow voids are maintained. Skull and upper cervical spine: Craniocervical junction within normal limits. Bone marrow signal intensity normal. Small calcified nodular density noted at the right frontal scalp, of doubtful significance. Sinuses/Orbits: Prior ocular lens replacement on the right. Paranasal sinuses are largely clear. Trace left mastoid effusion noted, of doubtful significance. Other: None. MRA HEAD FINDINGS ANTERIOR CIRCULATION: Examination moderately degraded by motion artifact. Both internal carotid arteries are patent through the siphons without significant stenosis or other abnormality. A1 segments patent bilaterally. Grossly normal anterior communicating artery complex. Both ACAs grossly patent without visible stenosis. No M1 stenosis. No visible proximal MCA branch occlusion. Distal MCA branches grossly perfused and symmetric. POSTERIOR CIRCULATION: Both V4 segments patent without stenosis. Left vertebral artery slightly dominant. Left PICA patent. Right PICA not seen. Basilar patent without stenosis. Superior cerebellar and posterior cerebral arteries patent bilaterally. No intracranial aneurysm. MRA NECK FINDINGS AORTIC ARCH: Examination moderately to severely degraded by motion artifact. Visualized aortic arch grossly within normal limits for caliber. Origin of the great vessels not well assessed on this exam. RIGHT CAROTID SYSTEM: Right common and internal carotid arteries are patent with antegrade flow. No visible hemodynamically significant stenosis about the right carotid artery system. LEFT CAROTID SYSTEM: Left common and internal carotid arteries are patent with antegrade flow. No visible hemodynamically significant stenosis about the left carotid artery system. VERTEBRAL ARTERIES: Both vertebral arteries arise from subclavian arteries. Left vertebral artery dominant. Vertebral arteries are patent with antegrade flow. No  visible hemodynamically significant stenosis. Other: None. IMPRESSION: MRI HEAD: 1. Motion degraded exam. 2. 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 3. Underlying cerebral atrophy with mild chronic small vessel ischemic disease. MRA HEAD: 1. Motion degraded exam. 2. Negative intracranial MRA for large vessel occlusion. No hemodynamically significant or correctable stenosis. MRA NECK: 1. Motion degraded exam. 2. Negative MRA of the neck for large vessel occlusion. No visible hemodynamically significant stenosis. Electronically Signed   By: Rise Mu M.D.   On: 11/17/2023 01:32   CT Head Wo Contrast Result Date: 11/16/2023 CLINICAL DATA:  Neuro deficit, acute, stroke suspected right sided weakness EXAM: CT  HEAD WITHOUT CONTRAST TECHNIQUE: Contiguous axial images were obtained from the base of the skull through the vertex without intravenous contrast. RADIATION DOSE REDUCTION: This exam was performed according to the departmental dose-optimization program which includes automated exposure control, adjustment of the mA and/or kV according to patient size and/or use of iterative reconstruction technique. COMPARISON:  None Available. FINDINGS: Brain: Normal anatomic configuration. Parenchymal volume loss is commensurate with the patient's age. Mild periventricular white matter changes are present likely reflecting the sequela of small vessel ischemia. No abnormal intra or extra-axial mass lesion or fluid collection. No abnormal mass effect or midline shift. No evidence of acute intracranial hemorrhage or infarct. Ventricular size is normal. Cerebellum unremarkable. Vascular: No asymmetric hyperdense vasculature at the skull base. Skull: Intact Sinuses/Orbits: Paranasal sinuses are clear. Orbits are unremarkable. Other: Mastoid air cells and middle ear cavities are clear. IMPRESSION: 1. No acute intracranial hemorrhage or infarct. 2. Mild  senescent change. Electronically Signed   By: Helyn Numbers M.D.   On: 11/16/2023 20:51   DG Chest Portable 1 View Result Date: 11/16/2023 CLINICAL DATA:  Aspiration evaluation. EXAM: PORTABLE CHEST 1 VIEW COMPARISON:  07/02/2022 FINDINGS: Prior median sternotomy and CABG. Mild cardiomegaly. Mediastinal contours within normal limits. Aortic atherosclerosis. No confluent airspace opacities or effusions. No acute bony abnormality. IMPRESSION: Cardiomegaly.  No active disease. Electronically Signed   By: Charlett Nose M.D.   On: 11/16/2023 20:02    Vitals:   11/17/23 0530 11/17/23 0545 11/17/23 0600 11/17/23 0722  BP: (!) 171/56 (!) 169/58 (!) 170/61   Pulse: (!) 52 (!) 52 (!) 50   Resp: (!) 31 (!) 29 (!) 28   Temp:    99.9 F (37.7 C)  TempSrc:    Axillary  SpO2: 99% 98% 100%   Weight:      Height:        PHYSICAL EXAM General: Acutely ill patient CV: Irregular rate and rhythm, bigeminy seen on monitor. Respiratory: Tachypnea, labored breathing. GI: Abdomen soft and nontender   NEURO:  Mental Status/Speech: No verbal response, does not follow commands.  Does not track examiner. 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 Right hemianopia, no blink to threat. III, IV, VI: Left gaze preference, does not cross midline. V: Unable to assess VII: Right facial droop VIII: hearing intact to voice. IX, X: Unable to assess XI: Unable to assess XII: tongue is midline without fasciculations. Motor:  Flaccid right arm, slight movement to right leg.   Spontaneous movement against gravity seen on left side. Sensation-unable to assess Coordination: Unable to assess Gait- deferred  Most Recent NIH 26    ASSESSMENT/PLAN  Stroke: Small punctate infarcts at bilateral frontal region Etiology: Likely embolic from non-STEMI Code Stroke CT head No acute abnormality.     MRI  Acute ischemic infarct left frontal centrum semi-ovale and right paramedian frontal  area MRA head and neck unremarkable 2D Echo EF 65 to 70% LDL 20 HgbA1c 7.3 VTE prophylaxis - Heparin gtt aspirin 81 mg daily prior to admission, now on heparin IV due to NSTEMI. Discontinue d/t transition to comfort care.  Disposition: Transitioning to comfort care  NSTEMI Hx of CAD/STEMI s/p CABG x2 2014 Respiratory failure Tachypnea with SOB Patient DNR Placed on heparin IV Cardiology on board Heparin IV discontinued due to transition to comfort care  Hypertension Home meds:  metoprolol 12.5mg  BID No PRNs for blood pressure due to transition to comfort care  Hyperlipidemia Home meds:  Lipitor 80mg  LDL 20, goal < 70  Diabetes type II Uncontrolled Home meds:  metformin 1000 BID, glimepride 2 mg BID HgbA1c 7.3, goal < 7.0 CBGs SSI Discontinue CBGs and SSI due to transition to comfort care  Dysphagia Patient has post-stroke dysphagia SLP consulted Currently n.p.o.  Other Stroke Risk Factors Family hx stroke (father) Advanced Age   Hospital day # 0   Pt seen by Neuro NP/APP and later by MD. Note/plan to be edited by MD as needed.    Lynnae January, DNP, AGACNP-BC [facility] Please use AMION for contact information & EPIC for messaging.  ATTENDING NOTE: I reviewed above note and agree with the assessment and plan. Pt was seen and examined.   No family bedside.  Patient lying in bed, tachypnea with difficulty breathing, SOB and encephalopathic.  Nonverbal, not follow commands, left gaze not cross midline, not blinking to be described on the right, right facial droop and right upper extremity flaccid, right lower extremity withdraw to pain but less than the left.  Consider with left MCA syndrome.  However patient MRI only showed punctate left frontal SO and right parafalcine frontal punctate infarcts.  Cardiology involved, concerning for non-STEMI, on heparin IV.  Patient now DNR status.  Palliative care involved, now transition to comfort care  measures.  For detailed assessment and plan, please refer to above/below as I have made changes wherever appropriate.   Neurology will sign off. Please call with questions.  Thanks for the consult.   Marvel Plan, MD PhD Stroke Neurology 11/17/2023 10:30 PM   To contact Stroke Continuity provider, please refer to [email or web address]. After hours, contact General Neurology

## 2023-11-17 NOTE — Evaluation (Signed)
Physical Therapy Evaluation and DIscharge Patient Details Name: Steve Hickman MRN: [id number] DOB: [birth year] Today's Date: 11/17/2023  History of Present Illness  Pt is 83 yo male who presents on 11/16/23 with AMS, found down, garbled speech, unable to use R side. 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  Possible NSTEMI as well. PMH: CAD s/p CABG, DM, LBBB, STEMI  Clinical Impression  Patient evaluated by Physical Therapy with no further acute PT needs identified. All education has been completed and the patient has no further questions. Pt from home per chart, pt nonverbal on eval and unable to answer any questions. Pt not following commands. RUE positioned in internal rotation with wrist and fingers flexed with no noted AROM. Pt using LUE but not on command. Did not follow any commands. No active motion of LE's noted. Pt with R gaze and L lean. Did not shift gaze to L when cued on that side, did shift gaze to R with audible stimulation on that side. Currently not appropriate for PT. Please reorder if status improves.  See below for any follow-up Physical Therapy or equipment needs. PT is signing off. Thank you for this referral.   HR 110's SPO2 90's (one and off O2, pt keeps taking off) BP 157/90      If plan is discharge home, recommend the following: Two people to help with walking and/or transfers;Two people to help with bathing/dressing/bathroom;Assistance with feeding;Assist for transportation;Help with stairs or ramp for entrance;Direct supervision/assist for medications management;Direct supervision/assist for financial management;Supervision due to cognitive status   Can travel by private vehicle        Equipment Recommendations None recommended by PT  Recommendations for Other Services       Functional Status Assessment Patient has had a recent decline in their functional status and demonstrates the ability to make significant improvements in  function in a reasonable and predictable amount of time.     Precautions / Restrictions Precautions Precautions: Fall Restrictions Weight Bearing Restrictions Per Provider Order: No      Mobility  Bed Mobility               General bed mobility comments: repositioned pt in bed into midline and pt resistant to mvmt.    Transfers                   General transfer comment: unsafe to attempt without pt following commands and elevated HR    Ambulation/Gait               General Gait Details: unable  Stairs            Wheelchair Mobility     Tilt Bed    Modified Rankin (Stroke Patients Only)       Balance                                             Pertinent Vitals/Pain Pain Assessment Pain Assessment: Faces Faces Pain Scale: Hurts even more Pain Location: generalized, pt grimaces when repositioned Pain Descriptors / Indicators: Grimacing Pain Intervention(s): Monitored during session    Home Living Family/patient expects to be discharged to:: Private residence Living Arrangements: Alone                      Prior Function Prior Level of Function :  Patient poor historian/Family not available                     Extremity/Trunk Assessment   Upper Extremity Assessment Upper Extremity Assessment: RUE deficits/detail;Difficult to assess due to impaired cognition RUE Deficits / Details: RUE rotated internally with wrist and fingers flexed, no active motion noted RUE Coordination: decreased gross motor;decreased fine motor    Lower Extremity Assessment Lower Extremity Assessment: Generalized weakness;Difficult to assess due to impaired cognition (no active motion noted LE's)    Cervical / Trunk Assessment Cervical / Trunk Assessment: Other exceptions Cervical / Trunk Exceptions: persistent L lean  Communication   Communication Communication: Difficulty following commands/understanding;Difficulty  communicating thoughts/reduced clarity of speech Cueing Techniques: Tactile cues  Cognition Arousal: Alert Behavior During Therapy: Flat affect Overall Cognitive Status: Impaired/Different from baseline                                 General Comments: pt non verbal. Sounding very wet in throat initially, HOB raised and RN assisted with suctioning. Pt not following commands. Not turning head to L at all. Turns head to voice on R but unclear whether he is seeing or not. Eyes watering, R>L        General Comments General comments (skin integrity, edema, etc.): HR sustained in 110's, SPO2 in 90's on RA (pt keeps taking O2 off with L hand), BP 157/90    Exercises     Assessment/Plan    PT Assessment Patient does not need any further PT services  PT Problem List         PT Treatment Interventions      PT Goals (Current goals can be found in the Care Plan section)  Acute Rehab PT Goals Patient Stated Goal: N/A PT Goal Formulation: All assessment and education complete, DC therapy    Frequency       Co-evaluation               AM-PAC PT "6 Clicks" Mobility  Outcome Measure Help needed turning from your back to your side while in a flat bed without using bedrails?: Total Help needed moving from lying on your back to sitting on the side of a flat bed without using bedrails?: Total Help needed moving to and from a bed to a chair (including a wheelchair)?: Total Help needed standing up from a chair using your arms (e.g., wheelchair or bedside chair)?: Total Help needed to walk in hospital room?: Total Help needed climbing 3-5 steps with a railing? : Total 6 Click Score: 6    End of Session Equipment Utilized During Treatment: Oxygen Activity Tolerance: Treatment limited secondary to agitation Patient left: in bed Nurse Communication: Mobility status PT Visit Diagnosis: Muscle weakness (generalized) (M62.81)    Time: 2952-8413 PT Time Calculation (min)  (ACUTE ONLY): 11 min   Charges:   PT Evaluation $PT Eval Low Complexity: 1 Low   PT General Charges $$ ACUTE PT VISIT: 1 Visit         Lyanne Co, PT  Acute Rehab Services Secure chat preferred Office [phone]   Lawana Chambers Namiyah Grantham 11/17/2023, 3:03 PM

## 2023-11-17 NOTE — ED Notes (Signed)
Patient transported to Ultrasound 

## 2023-11-17 NOTE — Progress Notes (Addendum)
PROGRESS NOTE    Steve Hickman  ZOX:[id number] DOB: [birth year] DOA: 11/16/2023 PCP: Philemon Kingdom, MD   Brief Narrative:  HPI: Steve Hickman is a 83 y.o. male with medical history significant of   CAD s/p CABG x 2 (LIMA-LAD, SV-PDA) 07/07/13 (EF 60%), DM, Dyslipidemia, LBBB and prior STEMI who was BIB [facility] from home after ex wife called for AMS . Please note  patient is unable to give history due to confusion.  Per chart patient ex wife last spoke to patient around 10 pm on 11/15/23 and at that time he was mildly confused and speech was garbled. This am patient was found down by ex wife in his home contracted on right side and unable to speak. He was noted to be very confused/nonverbal/ as well as hypotensive by ems. Patient in field was given 200cc bolus prior to arrival.Patient currently remains confused and nonverbal, does react to voice and attempts to speak.  ROS unable to be obtained.   ED Course:  IN ED patient wast noted to be confused and noted not to have clear speech. He was also noted to have right sided weakness and left gaze deviation. CTH negative.MRI  notable for . 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 MRA head and neck:Negative intracranial MRA for large vessel occlusion. No hemodynamically significant or correctable stenosis.Patient was continued on home ASA and statin  and plavix 75 mg po was added to regimen.   Further labs: Wbc 20.7 with left shift, hgb 10.5 ( 12.9 one year ago ) , MCV 108,  plt 169  Na 146, K 6.1 repeat s/p tx 5.8, bicarb 10,9, glu 170 cr 2.86  (1.47) AG 25 CE1,018,1021,1138,1286  mag 1.1 CK 1021   Cxr:NAD EKG: junctional rhythm tented  T, QT prolonged 540   Tx NS 1L, hyperkalemia protocol,zosyn,vanc  Assessment & Plan:   Principal Problem:   Change in mental status Active Problems:   NSTEMI (non-ST elevated myocardial infarction) (HCC)   Cerebrovascular  accident (CVA) (HCC)  Acute ischemic stroke: Initial CT head unremarkable, MRI brain confirmed 6 mm acute ischemic nonhemorrhagic deep white matter infarct involving the left frontal centrum semi ovale.  Seen by neurology.  Initial plan was to resume home aspirin and continue Plavix along with atorvastatin however later on when seen by cardiology, diagnosed with possible NSTEMI, patient started on heparin and antiplatelets were discontinued.  Patient to be seen by PT OT and SLP.  Carries poor prognosis.  Appreciate neurology help.  Continue neurochecks.  Acute metabolic encephalopathy: Likely secondary to metabolic derangements, AKI and, on assessment of the stroke.  Treat underlying causes.   Presumed Sepsis due to bacterial cause nos ruled out: -Patient presented with elevated wbc but bradycardia along with lactic acidosis.  However all of these appear to be secondary to profound dehydration leading to AKI and rhabdomyolysis.  Procalcitonin only 0.36, borderline and unreliable in the setting of CKD.  I do not see any signs of infection so far.  Patient was started on Zosyn and vancomycin.  With AKI going on, vancomycin carries more risks than benefits in this patient so I will discontinue that and as mentioned above, I do not see any signs of sepsis in the setting of being afebrile and leukocytosis improving with IV hydration, so I will discontinue Zosyn as well and monitor.   AKI on CKD stage IIIa secondary to rhabdomyolysis/metabolic acidosis/hyperkalemia/dehydration:  Baseline creatinine 1.4-1.6 just 2  months ago.  Presented with creatinine of 2.86 which has remained stable to 2.84 today.  Ultrasound renal negative.  Nephrology on board.  Despite of IV fluids, CK rising.  He is only on normal saline 100 cc/h which is not aggressive hydration so I will increase this to 200 cc/h for another day.  Repeat CK in the morning along with the labs.  Still with hyperkalemia potassium 5.3 but improved compared  to yesterday.  Hopefully with normal saline refill improved further.  Will recheck potassium later and monitor on telemetry.  Still with significant metabolic acidosis.  Will defer to nephrology.  I have discontinued atorvastatin due to ongoing and worsening rhabdomyolysis.  He had lactic acidosis, repeating lactic acid now.   Elevated troponin/demand ischemia versus NSTEMI?: History of CAD s/p CABG x 2.  Patient unable to provide history due to aphasia due to stroke. High sensitivity troponin elevated 1018->1138->1286> 2043> 2087 on arrival.  Seen by cardiology, out of concern of NSTEMI, started on heparin drip after clearance from neurology.  Echo pending.  Monitor on telemetry.  Concern for high-grade AV block/complete heart block: Could be due to electrolyte abnormalities and AKI.  Defer to cardiology.   Severe hypomagnesemia  -mag 1.1, replenished.  I am rechecking now.   Mild hypernatremia  Resolved with IV hydration.   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  On glimepiride and metformin at home.  Hemoglobin A1c 7.3.  Patient NPO.  Will start him on SSI.   HLD Holding atorvastatin for now due to rhabdomyolysis.  LDL only 20.   Anemia ,Macrocytic/B12 deficiency Hemoglobin appears stable at baseline.  B12 very low.  Will start with supplements and give him 1000 mcg IM x 1 injection.  Will need more supplement while in the hospital and at discharge as well.  Goal of care conversation: Patient was admitted as full code.  When seen today, patient was very tachypneic, using accessory muscles.  I called patient's ex-wife Kabeer Haw, had very lengthy discussion and updated her about patient's current condition and already acute issues that patient has been diagnosed with.  She told me that although they are too much.  They were in the process of getting remarried.  After learning about patient's current health conditions and extremely poor prognosis, she started crying over the phone.  We discussed about  the patient being full code and at the verge of requiring intubation and at high risk of cardiac arrest requiring CPR.  She immediately told me that her husband never wanted to " live like a vegetable" and after several questions and answers back-and-forth, she decided in favor of DNR.  Patient made DNR.  I have consulted palliative care.  In my opinion, patient has extremely poor prognosis and may not survive this hospital.  I encouraged comfort care.  Palliative care to leave those talks with the family.  DVT prophylaxis:    Code Status: Full Code  Family Communication:  None present at bedside.  Plan of care discussed with wife, please see above.  Status is: Inpatient Remains inpatient appropriate because: Patient extremely sick with poor prognosis.   Estimated body mass index is 23.52 kg/m as calculated from the following:   Height as of this encounter: 5\' 5"  (1.651 m).   Weight as of this encounter: 64.1 kg.    Nutritional Assessment: Body mass index is 23.52 kg/m.Marland Kitchen Seen by dietician.  I agree with the assessment and plan as outlined below: Nutrition Status:        .  Skin Assessment: I have examined the patient's skin and I agree with the wound assessment as performed by the wound care RN as outlined below:    Consultants:  Neurology, nephrology, cardiology  Procedures:  None  Antimicrobials:  Anti-infectives (From admission, onward)    Start     Dose/Rate Route Frequency Ordered Stop   11/18/23 2200  vancomycin (VANCOCIN) 750 mg in sodium chloride 0.9 % 250 mL IVPB  Status:  Discontinued        750 mg 250 mL/hr over 60 Minutes Intravenous Every 48 hours 11/17/23 0709 11/17/23 0841   11/17/23 0700  piperacillin-tazobactam (ZOSYN) IVPB 2.25 g  Status:  Discontinued        2.25 g 66.7 mL/hr over 30 Minutes Intravenous Every 8 hours 11/17/23 0656 11/17/23 0842   11/16/23 2145  vancomycin (VANCOREADY) IVPB 1500 mg/300 mL        1,500 mg 150 mL/hr over 120 Minutes  Intravenous  Once 11/16/23 2144 11/16/23 2347   11/16/23 2130  Vancomycin (VANCOCIN) 1,500 mg in sodium chloride 0.9 % 500 mL IVPB  Status:  Discontinued        1,500 mg 250 mL/hr over 120 Minutes Intravenous  Once 11/16/23 2122 11/16/23 2145   11/16/23 2130  piperacillin-tazobactam (ZOSYN) IVPB 3.375 g        3.375 g 100 mL/hr over 30 Minutes Intravenous  Once 11/16/23 2122 11/16/23 2212         Subjective: Patient seen and examined in the ED.  Patient aphasic and cachectic.  Appears in distress.  Not following commands but has his eyes open and tracks.  Objective: Vitals:   11/17/23 0530 11/17/23 0545 11/17/23 0600 11/17/23 0722  BP: (!) 171/56 (!) 169/58 (!) 170/61   Pulse: (!) 52 (!) 52 (!) 50   Resp: (!) 31 (!) 29 (!) 28   Temp:    99.9 F (37.7 C)  TempSrc:    Axillary  SpO2: 99% 98% 100%   Weight:      Height:        Intake/Output Summary (Last 24 hours) at 11/17/2023 0842 Last data filed at 11/16/2023 2139 Gross per 24 hour  Intake 2000 ml  Output --  Net 2000 ml   Filed Weights   11/17/23 0237  Weight: 64.1 kg    Examination:  General exam: Appears in distress with tachypnea. Respiratory system: Tachypnea with shallow breathing and diminished breath sounds due to that. Cardiovascular system: S1 & S2 heard, bradycardia. No JVD, murmurs, rubs, gallops or clicks. No pedal edema. Gastrointestinal system: Abdomen is nondistended, soft and nontender. No organomegaly or masses felt. Normal bowel sounds heard. Central nervous system: Alert but not oriented and not following any commands.  Data Reviewed: I have personally reviewed following labs and imaging studies  CBC: Recent Labs  Lab 11/16/23 1937 11/17/23 0322  WBC 20.7* 18.3*  NEUTROABS 17.6*  --   HGB 10.5* 10.0*  HCT 36.1* 31.3*  MCV 108.7* 100.3*  PLT 169 159   Basic Metabolic Panel: Recent Labs  Lab 11/16/23 1937 11/16/23 2352 11/17/23 0322 11/17/23 0431 11/17/23 0729  NA 146* 143 145  --    --   K 6.1* 5.8* 5.3* 5.1 4.9  CL 111 113* 111  --   --   CO2 10* 9* 10*  --   --   GLUCOSE 170* 166* 174*  --   --   BUN 41* 42* 45*  --   --   CREATININE 2.86* 2.75*  2.84*  --   --   CALCIUM 8.5* 7.8* 8.1*  --   --   MG 1.1*  --   --   --   --    GFR: Estimated Creatinine Clearance: 16.8 mL/min (A) (by C-G formula based on SCr of 2.84 mg/dL (H)). Liver Function Tests: Recent Labs  Lab 11/17/23 0431  AST 67*  ALT 31  ALKPHOS 50  BILITOT 2.4*  PROT 6.4*  ALBUMIN 3.4*   No results for input(s): "LIPASE", "AMYLASE" in the last 168 hours. No results for input(s): "AMMONIA" in the last 168 hours. Coagulation Profile: No results for input(s): "INR", "PROTIME" in the last 168 hours. Cardiac Enzymes: Recent Labs  Lab 11/16/23 1937 11/17/23 0322 11/17/23 0729  CKTOTAL 1,021* 2,094* 2,297*   BNP (last 3 results) No results for input(s): "PROBNP" in the last 8760 hours. HbA1C: Recent Labs    11/17/23 0322  HGBA1C 7.3*   CBG: Recent Labs  Lab 11/16/23 2136 11/17/23 0749  GLUCAP 144* 160*   Lipid Profile: Recent Labs    11/16/23 1937  CHOL 77  HDL 36*  LDLCALC 20  TRIG 161  CHOLHDL 2.1   Thyroid Function Tests: No results for input(s): "TSH", "T4TOTAL", "FREET4", "T3FREE", "THYROIDAB" in the last 72 hours. Anemia Panel: Recent Labs    11/17/23 0322  VITAMINB12 <50*  FOLATE 25.8  FERRITIN 73  TIBC 332  IRON 28*  RETICCTPCT 1.7   Sepsis Labs: Recent Labs  Lab 11/17/23 0322 11/17/23 0431  PROCALCITON 0.36  --   LATICACIDVEN 2.3* 2.1*    No results found for this or any previous visit (from the past 240 hours).   Radiology Studies: US RENAL Result Date: 11/17/2023 CLINICAL DATA:  AKI EXAM: RENAL / URINARY TRACT ULTRASOUND COMPLETE COMPARISON:  None Available. FINDINGS: Right Kidney: Renal measurements: 8.4 x 5.5 x 3.7 cm = volume: 90 mL. Echogenicity within normal limits. No mass or hydronephrosis visualized. Left Kidney: Renal measurements: 9.4  x 5.0 x 3.9 cm = volume: 95 mL. Echogenicity within normal limits. No mass or hydronephrosis visualized. Bladder: Appears normal for degree of bladder distention. Prostate indents the bladder floor. Other: None. IMPRESSION: Unremarkable ultrasound of the kidneys. Electronically Signed   By: Minerva Fester M.D.   On: 11/17/2023 03:55   MR BRAIN WO CONTRAST Result Date: 11/17/2023 CLINICAL DATA:  Initial evaluation for neuro deficit, stroke suspected. EXAM: MRI HEAD WITHOUT CONTRAST MRA HEAD WITHOUT CONTRAST MRA NECK WITHOUT CONTRAST TECHNIQUE: Multiplanar, multiecho pulse sequences of the brain and surrounding structures were obtained without intravenous contrast. Angiographic images of the Circle of Willis were obtained using MRA technique without intravenous contrast. Angiographic images of the neck were obtained using MRA technique without intravenous contrast. Carotid stenosis measurements (when applicable) are obtained utilizing NASCET criteria, using the distal internal carotid diameter as the denominator. COMPARISON:  CT from 11/16/2023. FINDINGS: MRI HEAD FINDINGS Brain: Examination moderately degraded by motion artifact. Diffuse prominence of the CSF containing spaces compatible with generalized cerebral atrophy. Patchy T2/FLAIR hyperintensity involving the periventricular and deep white matter both cerebral hemispheres, consistent chronic small vessel ischemic disease, mild in nature. 6 mm focus of restricted diffusion involving the deep white matter of the left frontal centrum semi ovale, consistent with a small acute ischemic infarct (series 5, image 39). No associated hemorrhage or mass effect. No other evidence for acute or subacute ischemia. Note made of mild diffusion signal abnormality involving the parasagittal right frontal lobe, felt to be consistent with artifact due  to adjacent dural calcifications. Gray-white matter differentiation maintained. No acute intracranial hemorrhage. A small focus  of chronic hemosiderin staining noted at the right occipital lobe (series 9, image 50). No mass lesion, midline shift or mass effect. No hydrocephalus or extra-axial fluid collection. Pituitary gland and suprasellar region within normal limits. Vascular: Major intracranial vascular flow voids are maintained. Skull and upper cervical spine: Craniocervical junction within normal limits. Bone marrow signal intensity normal. Small calcified nodular density noted at the right frontal scalp, of doubtful significance. Sinuses/Orbits: Prior ocular lens replacement on the right. Paranasal sinuses are largely clear. Trace left mastoid effusion noted, of doubtful significance. Other: None. MRA HEAD FINDINGS ANTERIOR CIRCULATION: Examination moderately degraded by motion artifact. Both internal carotid arteries are patent through the siphons without significant stenosis or other abnormality. A1 segments patent bilaterally. Grossly normal anterior communicating artery complex. Both ACAs grossly patent without visible stenosis. No M1 stenosis. No visible proximal MCA branch occlusion. Distal MCA branches grossly perfused and symmetric. POSTERIOR CIRCULATION: Both V4 segments patent without stenosis. Left vertebral artery slightly dominant. Left PICA patent. Right PICA not seen. Basilar patent without stenosis. Superior cerebellar and posterior cerebral arteries patent bilaterally. No intracranial aneurysm. MRA NECK FINDINGS AORTIC ARCH: Examination moderately to severely degraded by motion artifact. Visualized aortic arch grossly within normal limits for caliber. Origin of the great vessels not well assessed on this exam. RIGHT CAROTID SYSTEM: Right common and internal carotid arteries are patent with antegrade flow. No visible hemodynamically significant stenosis about the right carotid artery system. LEFT CAROTID SYSTEM: Left common and internal carotid arteries are patent with antegrade flow. No visible hemodynamically  significant stenosis about the left carotid artery system. VERTEBRAL ARTERIES: Both vertebral arteries arise from subclavian arteries. Left vertebral artery dominant. Vertebral arteries are patent with antegrade flow. No visible hemodynamically significant stenosis. Other: None. IMPRESSION: MRI HEAD: 1. Motion degraded exam. 2. 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 3. Underlying cerebral atrophy with mild chronic small vessel ischemic disease. MRA HEAD: 1. Motion degraded exam. 2. Negative intracranial MRA for large vessel occlusion. No hemodynamically significant or correctable stenosis. MRA NECK: 1. Motion degraded exam. 2. Negative MRA of the neck for large vessel occlusion. No visible hemodynamically significant stenosis. Electronically Signed   By: Rise Mu M.D.   On: 11/17/2023 01:32   MR ANGIO HEAD WO CONTRAST Result Date: 11/17/2023 CLINICAL DATA:  Initial evaluation for neuro deficit, stroke suspected. EXAM: MRI HEAD WITHOUT CONTRAST MRA HEAD WITHOUT CONTRAST MRA NECK WITHOUT CONTRAST TECHNIQUE: Multiplanar, multiecho pulse sequences of the brain and surrounding structures were obtained without intravenous contrast. Angiographic images of the Circle of Willis were obtained using MRA technique without intravenous contrast. Angiographic images of the neck were obtained using MRA technique without intravenous contrast. Carotid stenosis measurements (when applicable) are obtained utilizing NASCET criteria, using the distal internal carotid diameter as the denominator. COMPARISON:  CT from 11/16/2023. FINDINGS: MRI HEAD FINDINGS Brain: Examination moderately degraded by motion artifact. Diffuse prominence of the CSF containing spaces compatible with generalized cerebral atrophy. Patchy T2/FLAIR hyperintensity involving the periventricular and deep white matter both cerebral hemispheres, consistent chronic small vessel ischemic  disease, mild in nature. 6 mm focus of restricted diffusion involving the deep white matter of the left frontal centrum semi ovale, consistent with a small acute ischemic infarct (series 5, image 39). No associated hemorrhage or mass effect. No other evidence for acute or subacute ischemia. Note made of mild  diffusion signal abnormality involving the parasagittal right frontal lobe, felt to be consistent with artifact due to adjacent dural calcifications. Gray-white matter differentiation maintained. No acute intracranial hemorrhage. A small focus of chronic hemosiderin staining noted at the right occipital lobe (series 9, image 50). No mass lesion, midline shift or mass effect. No hydrocephalus or extra-axial fluid collection. Pituitary gland and suprasellar region within normal limits. Vascular: Major intracranial vascular flow voids are maintained. Skull and upper cervical spine: Craniocervical junction within normal limits. Bone marrow signal intensity normal. Small calcified nodular density noted at the right frontal scalp, of doubtful significance. Sinuses/Orbits: Prior ocular lens replacement on the right. Paranasal sinuses are largely clear. Trace left mastoid effusion noted, of doubtful significance. Other: None. MRA HEAD FINDINGS ANTERIOR CIRCULATION: Examination moderately degraded by motion artifact. Both internal carotid arteries are patent through the siphons without significant stenosis or other abnormality. A1 segments patent bilaterally. Grossly normal anterior communicating artery complex. Both ACAs grossly patent without visible stenosis. No M1 stenosis. No visible proximal MCA branch occlusion. Distal MCA branches grossly perfused and symmetric. POSTERIOR CIRCULATION: Both V4 segments patent without stenosis. Left vertebral artery slightly dominant. Left PICA patent. Right PICA not seen. Basilar patent without stenosis. Superior cerebellar and posterior cerebral arteries patent bilaterally. No  intracranial aneurysm. MRA NECK FINDINGS AORTIC ARCH: Examination moderately to severely degraded by motion artifact. Visualized aortic arch grossly within normal limits for caliber. Origin of the great vessels not well assessed on this exam. RIGHT CAROTID SYSTEM: Right common and internal carotid arteries are patent with antegrade flow. No visible hemodynamically significant stenosis about the right carotid artery system. LEFT CAROTID SYSTEM: Left common and internal carotid arteries are patent with antegrade flow. No visible hemodynamically significant stenosis about the left carotid artery system. VERTEBRAL ARTERIES: Both vertebral arteries arise from subclavian arteries. Left vertebral artery dominant. Vertebral arteries are patent with antegrade flow. No visible hemodynamically significant stenosis. Other: None. IMPRESSION: MRI HEAD: 1. Motion degraded exam. 2. 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 3. Underlying cerebral atrophy with mild chronic small vessel ischemic disease. MRA HEAD: 1. Motion degraded exam. 2. Negative intracranial MRA for large vessel occlusion. No hemodynamically significant or correctable stenosis. MRA NECK: 1. Motion degraded exam. 2. Negative MRA of the neck for large vessel occlusion. No visible hemodynamically significant stenosis. Electronically Signed   By: Rise Mu M.D.   On: 11/17/2023 01:32   MR ANGIO NECK WO CONTRAST Result Date: 11/17/2023 CLINICAL DATA:  Initial evaluation for neuro deficit, stroke suspected. EXAM: MRI HEAD WITHOUT CONTRAST MRA HEAD WITHOUT CONTRAST MRA NECK WITHOUT CONTRAST TECHNIQUE: Multiplanar, multiecho pulse sequences of the brain and surrounding structures were obtained without intravenous contrast. Angiographic images of the Circle of Willis were obtained using MRA technique without intravenous contrast. Angiographic images of the neck were obtained using MRA  technique without intravenous contrast. Carotid stenosis measurements (when applicable) are obtained utilizing NASCET criteria, using the distal internal carotid diameter as the denominator. COMPARISON:  CT from 11/16/2023. FINDINGS: MRI HEAD FINDINGS Brain: Examination moderately degraded by motion artifact. Diffuse prominence of the CSF containing spaces compatible with generalized cerebral atrophy. Patchy T2/FLAIR hyperintensity involving the periventricular and deep white matter both cerebral hemispheres, consistent chronic small vessel ischemic disease, mild in nature. 6 mm focus of restricted diffusion involving the deep white matter of the left frontal centrum semi ovale, consistent with a small acute ischemic infarct (series 5, image 39). No associated hemorrhage  or mass effect. No other evidence for acute or subacute ischemia. Note made of mild diffusion signal abnormality involving the parasagittal right frontal lobe, felt to be consistent with artifact due to adjacent dural calcifications. Gray-white matter differentiation maintained. No acute intracranial hemorrhage. A small focus of chronic hemosiderin staining noted at the right occipital lobe (series 9, image 50). No mass lesion, midline shift or mass effect. No hydrocephalus or extra-axial fluid collection. Pituitary gland and suprasellar region within normal limits. Vascular: Major intracranial vascular flow voids are maintained. Skull and upper cervical spine: Craniocervical junction within normal limits. Bone marrow signal intensity normal. Small calcified nodular density noted at the right frontal scalp, of doubtful significance. Sinuses/Orbits: Prior ocular lens replacement on the right. Paranasal sinuses are largely clear. Trace left mastoid effusion noted, of doubtful significance. Other: None. MRA HEAD FINDINGS ANTERIOR CIRCULATION: Examination moderately degraded by motion artifact. Both internal carotid arteries are patent through the  siphons without significant stenosis or other abnormality. A1 segments patent bilaterally. Grossly normal anterior communicating artery complex. Both ACAs grossly patent without visible stenosis. No M1 stenosis. No visible proximal MCA branch occlusion. Distal MCA branches grossly perfused and symmetric. POSTERIOR CIRCULATION: Both V4 segments patent without stenosis. Left vertebral artery slightly dominant. Left PICA patent. Right PICA not seen. Basilar patent without stenosis. Superior cerebellar and posterior cerebral arteries patent bilaterally. No intracranial aneurysm. MRA NECK FINDINGS AORTIC ARCH: Examination moderately to severely degraded by motion artifact. Visualized aortic arch grossly within normal limits for caliber. Origin of the great vessels not well assessed on this exam. RIGHT CAROTID SYSTEM: Right common and internal carotid arteries are patent with antegrade flow. No visible hemodynamically significant stenosis about the right carotid artery system. LEFT CAROTID SYSTEM: Left common and internal carotid arteries are patent with antegrade flow. No visible hemodynamically significant stenosis about the left carotid artery system. VERTEBRAL ARTERIES: Both vertebral arteries arise from subclavian arteries. Left vertebral artery dominant. Vertebral arteries are patent with antegrade flow. No visible hemodynamically significant stenosis. Other: None. IMPRESSION: MRI HEAD: 1. Motion degraded exam. 2. 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 3. Underlying cerebral atrophy with mild chronic small vessel ischemic disease. MRA HEAD: 1. Motion degraded exam. 2. Negative intracranial MRA for large vessel occlusion. No hemodynamically significant or correctable stenosis. MRA NECK: 1. Motion degraded exam. 2. Negative MRA of the neck for large vessel occlusion. No visible hemodynamically significant stenosis. Electronically Signed   By:  Rise Mu M.D.   On: 11/17/2023 01:32   CT Head Wo Contrast Result Date: 11/16/2023 CLINICAL DATA:  Neuro deficit, acute, stroke suspected right sided weakness EXAM: CT HEAD WITHOUT CONTRAST TECHNIQUE: Contiguous axial images were obtained from the base of the skull through the vertex without intravenous contrast. RADIATION DOSE REDUCTION: This exam was performed according to the departmental dose-optimization program which includes automated exposure control, adjustment of the mA and/or kV according to patient size and/or use of iterative reconstruction technique. COMPARISON:  None Available. FINDINGS: Brain: Normal anatomic configuration. Parenchymal volume loss is commensurate with the patient's age. Mild periventricular white matter changes are present likely reflecting the sequela of small vessel ischemia. No abnormal intra or extra-axial mass lesion or fluid collection. No abnormal mass effect or midline shift. No evidence of acute intracranial hemorrhage or infarct. Ventricular size is normal. Cerebellum unremarkable. Vascular: No asymmetric hyperdense vasculature at the skull base. Skull: Intact Sinuses/Orbits: Paranasal sinuses are clear. Orbits are unremarkable. Other: Mastoid air  cells and middle ear cavities are clear. IMPRESSION: 1. No acute intracranial hemorrhage or infarct. 2. Mild senescent change. Electronically Signed   By: Helyn Numbers M.D.   On: 11/16/2023 20:51   DG Chest Portable 1 View Result Date: 11/16/2023 CLINICAL DATA:  Aspiration evaluation. EXAM: PORTABLE CHEST 1 VIEW COMPARISON:  07/02/2022 FINDINGS: Prior median sternotomy and CABG. Mild cardiomegaly. Mediastinal contours within normal limits. Aortic atherosclerosis. No confluent airspace opacities or effusions. No acute bony abnormality. IMPRESSION: Cardiomegaly.  No active disease. Electronically Signed   By: Charlett Nose M.D.   On: 11/16/2023 20:02    Scheduled Meds:  [START ON 11/18/2023]  stroke: early  stages of recovery book   Does not apply Once   sodium bicarbonate 150 mEq in dextrose 5 % 1,150 mL infusion   Intravenous Once   Continuous Infusions:  sodium chloride 100 mL/hr at 11/17/23 0404   heparin 800 Units/hr (11/17/23 0549)     LOS: 0 days   Hughie Closs, MD [facility]  11/17/2023, 8:42 AM   Time spent: I have spent 55 minutes in the care of this patient including review of the chart, evaluating patient, documentation, communicating with family and other specialties.  *Please note that this is a verbal dictation therefore any spelling or grammatical errors are due to the "Dragon [employer] One" system interpretation.  Please page via Amion and do not message via secure chat for urgent patient care matters. Secure chat can be used for non urgent patient care matters.  How to contact the [facility] Attending or Consulting provider 7A - 7P or covering provider during after hours 7P -7A, for this patient?  Check the care team in [facility] and look for a) attending/consulting [facility] provider listed and b) the [facility] team listed. Page or secure chat 7A-7P. Log into [email or web address] and use [facility] universal password to access. If you do not have the password, please contact the hospital operator. Locate the [facility] provider you are looking for under [facility] and page to a number that you can be directly reached. If you still have difficulty reaching the provider, please page the [facility] (Director on Call) for the Hospitalists listed on amion for assistance.

## 2023-11-17 NOTE — Progress Notes (Signed)
CSW received message from [place], Palliative NP who states patient's wife needs assistance with transportation to get to the hospital.  CSW spoke with patient's wife Steve Hickman to discuss need - Steve Hickman states she has someone coming to get her to bring her to the hospital.   Edwin Dada, MSW, LCSW Transitions of Care  Clinical Social Worker II [phone]

## 2023-11-17 NOTE — Progress Notes (Signed)
PHARMACY - ANTICOAGULATION CONSULT NOTE  Pharmacy Consult for heparin Indication: chest pain/ACS  No Known Allergies  Patient Measurements: Height: 5\' 5"  (165.1 cm) Weight: 64.1 kg (141 lb 5 oz) IBW/kg (Calculated) : 61.5 Heparin Dosing Weight: 64 kg  Vital Signs: Temp: 99.3 F (37.4 C) (01/21 1500) Temp Source: Axillary (01/21 1500) BP: 142/62 (01/21 1500) Pulse Rate: 84 (01/21 1500)  Labs: Recent Labs    11/16/23 1937 11/16/23 2140 11/16/23 2352 11/17/23 0322 11/17/23 0431 11/17/23 0729 11/17/23 1421  HGB 10.5*  --   --  10.0*  --   --   --   HCT 36.1*  --   --  31.3*  --   --   --   PLT 169  --   --  159  --   --   --   HEPARINUNFRC  --   --   --   --   --   --  0.55  CREATININE 2.86*  --  2.75* 2.84*  --   --   --   CKTOTAL 1,021*  --   --  2,094*  --  2,297*  --   TROPONINIHS 1,018*   < > 1,286* 2,043* 2,087*  --   --    < > = values in this interval not displayed.    Estimated Creatinine Clearance: 16.8 mL/min (A) (by C-G formula based on SCr of 2.84 mg/dL (H)).   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 PMH: hx of CAD (inferior STEMI 05/2013 with PCI Lcx followed by CABG 06/2013 LIMA-LAD, SVG-PD), LBBB, poorly controlled DM2, hypertension, hyperlipidemia. Pharmacy has been consulted to dose heparin for ACS.  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 No issues with the infusion or bleeding reported.  Goal of Therapy:  Heparin level 0.3-0.5 units/ml Monitor  platelets by anticoagulation protocol: Yes   Plan:  -Decrease heparin 700 units/hr -8h heparin level -CBC and heparin level daily -F/u cards plan  Loralee Pacas, PharmD, BCPS 11/17/2023,3:32 PM  Please check AMION for all [facility] Pharmacy phone numbers After 10:00 PM, call Main Pharmacy [phone]

## 2023-11-17 NOTE — Progress Notes (Signed)
Lengthy discussion with the wife about CODE STATUS.  She agreed to transition to DNR.  Details to follow in my progress note today.

## 2023-11-17 NOTE — ED Notes (Signed)
Returned from MRI , admitting MD evaluated patient , repositioned on bed , IV sites intact , adult brief applied .

## 2023-11-17 NOTE — Consult Note (Signed)
Reason for Consult: Renal failure Referring Physician:  Dr. 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  Patient has had progression of CKD with slowly progressive disease since early 2023.  Patient received Kayexalate, calcium gluconate and bicarbonate with potassium coming down nicely.  Acute component of renal failure likely secondary to prerenal azotemia initially progressing to ATN with rhabdomyolysis.  No ultrasound shows kidneys that are on the smaller side but no evidence of obstruction.   -Patient being hydrated aggressively with isotonic bicarbonate solution as well as normal saline. - Gap acidosis secondary to renal failure; will check osmolar gap as well but not expecting to find anything. -No absolute indication for dialysis at this current moment and will follow closely with you.  -Monitor Daily I/Os, Daily weight  -Maintain MAP>65 for optimal renal perfusion.  - Avoid nephrotoxic agents such as IV contrast, NSAIDs, and phosphate containing bowel preps (FLEETS)  Rhabdomyolysis -CK still rising from 1000-2000 since admission. 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  On exam left gaze deviation and not moving his right side much. DM - per primary team   HPI: Steve Hickman is an 83 y.o. 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  Per chart his ex-wife spoke to the patient on 11/15/2023 and he was confused with garbled speech.  Finally when the ex-wife found the patient he was down, contracted on the right side unable to speak.  EMS noted him to be  hypotensive as well as confused and nonverbal.  The ED he had right-sided weakness and left gaze deviation for the MRI noting a acute ischemic nonhemorrhagic left frontal CVA with negative for large vessel occlusion.  Patient was also noted to have a white count of 20.7, potassium 6.1, creatinine of 2.86.  Rate was in the 50s, not requiring supplemental oxygen.  Lipids were elevated in the 1002 1286 range.  Received a tubular normal saline bolus, calcium gluconate, vancomycin, Zosyn.  Patient is usually functional and lives independently.  ROS Pertinent items are noted in HPI.  Chemistry and CBC: Creat  Date/Time Value Ref Range Status  12/07/2014 11:40 AM 0.83 0.50 - 1.35 mg/dL Final   Creatinine, Ser  Date/Time Value Ref Range Status  11/17/2023 03:22 AM 2.84 (H) 0.61 - 1.24 mg/dL Final  60/45/4098 11:91 PM 2.75 (H) 0.61 - 1.24 mg/dL Final  47/82/9562 13:08 PM 2.86 (H) 0.61 - 1.24 mg/dL Final  65/78/4696 29:52 PM 1.47 (H) 0.76 - 1.27 mg/dL Final  84/13/2440 10:27 AM 1.57 (H) 0.76 - 1.27 mg/dL Final  25/36/6440 34:74 PM 1.33 (H) 0.76 - 1.27 mg/dL Final  25/95/6387 56:43 PM 1.29 (H) 0.76 - 1.27 mg/dL Final  32/95/1884 16:60 AM 0.78 0.50 - 1.35 mg/dL Final  63/10/6008 93:23 AM 0.84 0.50 - 1.35 mg/dL Final  55/73/2202 54:27 PM 0.90 0.50 - 1.35 mg/dL Final  04/19/7627 31:51 PM 0.86 0.50 - 1.35 mg/dL Final  76/16/0737 10:62 AM 0.70 0.50 - 1.35 mg/dL Final  69/48/5462 70:35 PM 0.80 0.50 - 1.35 mg/dL Final  00/93/8182 99:37 PM 0.71 0.50 - 1.35 mg/dL Final  16/96/7893 81:01 AM 0.80 0.50 - 1.35 mg/dL  Final  05/30/2013 04:50 AM 0.98 0.50 - 1.35 mg/dL Final  16/07/9603 54:09 AM 0.85 0.50 - 1.35 mg/dL Final  81/19/1478 29:56 PM 0.82 0.50 - 1.35 mg/dL Final  21/30/8657 84:69 PM 1.10 0.50 - 1.35 mg/dL Final  62/95/2841 32:44 PM 1.03 0.50 - 1.35 mg/dL Final   Recent Labs  Lab 11/16/23 1937 11/16/23 2352 11/17/23 0322 11/17/23 0431  NA 146* 143 145  --   K 6.1* 5.8* 5.3* 5.1  CL 111 113*  111  --   CO2 10* 9* 10*  --   GLUCOSE 170* 166* 174*  --   BUN 41* 42* 45*  --   CREATININE 2.86* 2.75* 2.84*  --   CALCIUM 8.5* 7.8* 8.1*  --    Recent Labs  Lab 11/16/23 1937 11/17/23 0322  WBC 20.7* 18.3*  NEUTROABS 17.6*  --   HGB 10.5* 10.0*  HCT 36.1* 31.3*  MCV 108.7* 100.3*  PLT 169 159   Liver Function Tests: Recent Labs  Lab 11/17/23 0431  AST 67*  ALT 31  ALKPHOS 50  BILITOT 2.4*  PROT 6.4*  ALBUMIN 3.4*   No results for input(s): "LIPASE", "AMYLASE" in the last 168 hours. No results for input(s): "AMMONIA" in the last 168 hours. Cardiac Enzymes: Recent Labs  Lab 11/16/23 1937 11/17/23 0322  CKTOTAL 1,021* 2,094*   Iron Studies:  Recent Labs    11/17/23 0322  IRON 28*  TIBC 332  FERRITIN 73   PT/INR: @LABRCNTIP (inr:5)  Xrays/Other Studies: ) Results for orders placed or performed during the hospital encounter of 11/16/23 (from the past 48 hours)  Lipid panel     Status: Abnormal   Collection Time: 11/16/23  7:37 PM  Result Value Ref Range   Cholesterol 77 0 - 200 mg/dL   Triglycerides 010 <272 mg/dL   HDL 36 (L) >53 mg/dL   Total CHOL/HDL Ratio 2.1 RATIO   VLDL 21 0 - 40 mg/dL   LDL Cholesterol 20 0 - 99 mg/dL    Comment:        Total Cholesterol/HDL:CHD Risk Coronary Heart Disease Risk Table                     Men   Women  1/2 Average Risk   3.4   3.3  Average Risk       5.0   4.4  2 X Average Risk   9.6   7.1  3 X Average Risk  23.4   11.0        Use the calculated Patient Ratio above and the CHD Risk Table to determine the patient's CHD Risk.        ATP III CLASSIFICATION (LDL):  <100     mg/dL   Optimal  664-403  mg/dL   Near or Above                    Optimal  130-159  mg/dL   Borderline  474-259  mg/dL   High  >563     mg/dL   Very High Performed at [facility], [address], [place], [place] [address]   Basic metabolic panel     Status: Abnormal   Collection Time: 11/16/23  7:37 PM  Result Value Ref  Range   Sodium 146 (H) 135 - 145 mmol/L   Potassium 6.1 (H) 3.5 - 5.1 mmol/L   Chloride 111 98 - 111 mmol/L   CO2 10 (L) 22 -  32 mmol/L   Glucose, Bld 170 (H) 70 - 99 mg/dL    Comment: Glucose reference range applies only to samples taken after fasting for at least 8 hours.   BUN 41 (H) 8 - 23 mg/dL   Creatinine, Ser 1.91 (H) 0.61 - 1.24 mg/dL   Calcium 8.5 (L) 8.9 - 10.3 mg/dL   GFR, Estimated 21 (L) >60 mL/min    Comment: (NOTE) Calculated using the CKD-EPI Creatinine Equation (2021)    Anion gap 25 (H) 5 - 15    Comment: ELECTROLYTES REPEATED TO VERIFY Performed at [facility], [address] [place], [place] [address]   CBC with Differential     Status: Abnormal   Collection Time: 11/16/23  7:37 PM  Result Value Ref Range   WBC 20.7 (H) 4.0 - 10.5 K/uL   RBC 3.32 (L) 4.22 - 5.81 MIL/uL   Hemoglobin 10.5 (L) 13.0 - 17.0 g/dL   HCT 56.2 (L) 13.0 - 86.5 %   MCV 108.7 (H) 80.0 - 100.0 fL   MCH 31.6 26.0 - 34.0 pg   MCHC 29.1 (L) 30.0 - 36.0 g/dL   RDW 78.4 69.6 - 29.5 %   Platelets 169 150 - 400 K/uL   nRBC 0.0 0.0 - 0.2 %   Neutrophils Relative % 85 %   Neutro Abs 17.6 (H) 1.7 - 7.7 K/uL   Lymphocytes Relative 7 %   Lymphs Abs 1.5 0.7 - 4.0 K/uL   Monocytes Relative 7 %   Monocytes Absolute 1.5 (H) 0.1 - 1.0 K/uL   Eosinophils Relative 0 %   Eosinophils Absolute 0.0 0.0 - 0.5 K/uL   Basophils Relative 0 %   Basophils Absolute 0.0 0.0 - 0.1 K/uL   Immature Granulocytes 1 %   Abs Immature Granulocytes 0.14 (H) 0.00 - 0.07 K/uL    Comment: Performed at [facility], [address] [place], [place] [address]  Troponin I (High Sensitivity)     Status: Abnormal   Collection Time: 11/16/23  7:37 PM  Result Value Ref Range   Troponin I (High Sensitivity) 1,018 (HH) <18 ng/L    Comment: CRITICAL RESULT CALLED TO, READ BACK BY AND VERIFIED WITH BOBBY SANGGALAN RN @2022  ON 11/16/23 BY MAB (NOTE) Elevated high sensitivity troponin I (hsTnI) values and significant   changes across serial measurements may suggest ACS but many other  chronic and acute conditions are known to elevate hsTnI results.  Refer to the "Links" section for chest pain algorithms and additional  guidance. Performed at [facility], [address] [place], [place] [address]   Magnesium     Status: Abnormal   Collection Time: 11/16/23  7:37 PM  Result Value Ref Range   Magnesium 1.1 (L) 1.7 - 2.4 mg/dL    Comment: Performed at [facility], [address], [place], [place] [address]  CK     Status: Abnormal   Collection Time: 11/16/23  7:37 PM  Result Value Ref Range   Total CK 1,021 (H) 49 - 397 U/L    Comment: Performed at [facility], [address], [place], [place] [address]  POC CBG, ED     Status: Abnormal   Collection Time: 11/16/23  9:36 PM  Result Value Ref Range   Glucose-Capillary 144 (H) 70 - 99 mg/dL    Comment: Glucose reference range applies only to samples taken after fasting for at least 8 hours.  Troponin I (High Sensitivity)  Status: Abnormal   Collection Time: 11/16/23  9:40 PM  Result Value Ref Range   Troponin I (High Sensitivity) 1,138 (HH) <18 ng/L    Comment: CRITICAL VALUE NOTED. VALUE IS CONSISTENT WITH PREVIOUSLY REPORTED/CALLED VALUE (NOTE) Elevated high sensitivity troponin I (hsTnI) values and significant  changes across serial measurements may suggest ACS but many other  chronic and acute conditions are known to elevate hsTnI results.  Refer to the "Links" section for chest pain algorithms and additional  guidance. Performed at [facility], [address] [place], [place] [address]   Basic metabolic panel     Status: Abnormal   Collection Time: 11/16/23 11:52 PM  Result Value Ref Range   Sodium 143 135 - 145 mmol/L   Potassium 5.8 (H) 3.5 - 5.1 mmol/L   Chloride 113 (H) 98 - 111 mmol/L   CO2 9 (L) 22 - 32 mmol/L   Glucose, Bld 166 (H) 70 - 99 mg/dL    Comment: Glucose reference range applies only to  samples taken after fasting for at least 8 hours.   BUN 42 (H) 8 - 23 mg/dL   Creatinine, Ser 3.87 (H) 0.61 - 1.24 mg/dL   Calcium 7.8 (L) 8.9 - 10.3 mg/dL   GFR, Estimated 22 (L) >60 mL/min    Comment: (NOTE) Calculated using the CKD-EPI Creatinine Equation (2021)    Anion gap 21 (H) 5 - 15    Comment: ELECTROLYTES REPEATED TO VERIFY Performed at [facility], [address], [place], [place] [address]   Troponin I (High Sensitivity)     Status: Abnormal   Collection Time: 11/16/23 11:52 PM  Result Value Ref Range   Troponin I (High Sensitivity) 1,286 (HH) <18 ng/L    Comment: CRITICAL VALUE NOTED. VALUE IS CONSISTENT WITH PREVIOUSLY REPORTED/CALLED VALUE (NOTE) Elevated high sensitivity troponin I (hsTnI) values and significant  changes across serial measurements may suggest ACS but many other  chronic and acute conditions are known to elevate hsTnI results.  Refer to the "Links" section for chest pain algorithms and additional  guidance. Performed at [facility], [address] [place], [place] [address]   Brain natriuretic peptide     Status: Abnormal   Collection Time: 11/17/23  2:05 AM  Result Value Ref Range   B Natriuretic Peptide 3,567.9 (H) 0.0 - 100.0 pg/mL    Comment: Performed at [facility], [address] [place], [place] [address]  Basic metabolic panel     Status: Abnormal   Collection Time: 11/17/23  3:22 AM  Result Value Ref Range   Sodium 145 135 - 145 mmol/L   Potassium 5.3 (H) 3.5 - 5.1 mmol/L   Chloride 111 98 - 111 mmol/L   CO2 10 (L) 22 - 32 mmol/L   Glucose, Bld 174 (H) 70 - 99 mg/dL    Comment: Glucose reference range applies only to samples taken after fasting for at least 8 hours.   BUN 45 (H) 8 - 23 mg/dL   Creatinine, Ser 0.63 (H) 0.61 - 1.24 mg/dL   Calcium 8.1 (L) 8.9 - 10.3 mg/dL   GFR, Estimated 21 (L) >60 mL/min    Comment: (NOTE) Calculated using the CKD-EPI Creatinine Equation (2021)    Anion gap 24 (H) 5 - 15     Comment: ELECTROLYTES REPEATED TO VERIFY Performed at [facility], [address] [place], [place] [address]   CK     Status: Abnormal  Collection Time: 11/17/23  3:22 AM  Result Value Ref Range   Total CK 2,094 (H) 49 - 397 U/L    Comment: Performed at [facility], [address], [place], [place] [address]  Hemoglobin A1c     Status: Abnormal   Collection Time: 11/17/23  3:22 AM  Result Value Ref Range   Hgb A1c MFr Bld 7.3 (H) 4.8 - 5.6 %    Comment: (NOTE) Pre diabetes:          5.7%-6.4%  Diabetes:              >6.4%  Glycemic control for   <7.0% adults with diabetes    Mean Plasma Glucose 162.81 mg/dL    Comment: Performed at [facility], [address] [place], [place] [address]  CBC     Status: Abnormal   Collection Time: 11/17/23  3:22 AM  Result Value Ref Range   WBC 18.3 (H) 4.0 - 10.5 K/uL   RBC 3.12 (L) 4.22 - 5.81 MIL/uL   Hemoglobin 10.0 (L) 13.0 - 17.0 g/dL   HCT 82.9 (L) 56.2 - 13.0 %   MCV 100.3 (H) 80.0 - 100.0 fL    Comment: REPEATED TO VERIFY   MCH 32.1 26.0 - 34.0 pg   MCHC 31.9 30.0 - 36.0 g/dL   RDW 86.5 78.4 - 69.6 %   Platelets 159 150 - 400 K/uL   nRBC 0.0 0.0 - 0.2 %    Comment: Performed at [facility], [address] [place], [place] [address]  Blood Alcohol Level     Status: None   Collection Time: 11/17/23  3:22 AM  Result Value Ref Range   Alcohol, Ethyl (B) <10 <10 mg/dL    Comment: (NOTE) Lowest detectable limit for serum alcohol is 10 mg/dL.  For medical purposes only. Performed at [facility], [address], [place], [place] [address]   Vitamin B12     Status: Abnormal   Collection Time: 11/17/23  3:22 AM  Result Value Ref Range   Vitamin B-12 <50 (L) 180 - 914 pg/mL    Comment: (NOTE) This assay is not validated for testing neonatal or myeloproliferative syndrome specimens for Vitamin B12 levels. Performed at [facility], [address], [place], [place] [address]    Folate     Status: None   Collection Time: 11/17/23  3:22 AM  Result Value Ref Range   Folate 25.8 >5.9 ng/mL    Comment: Performed at [facility], [address], [place], [place] [address]  Iron and TIBC     Status: Abnormal   Collection Time: 11/17/23  3:22 AM  Result Value Ref Range   Iron 28 (L) 45 - 182 ug/dL   TIBC 644 034 - 742 ug/dL   Saturation Ratios 8 (L) 17.9 - 39.5 %   UIBC 304 ug/dL    Comment: Performed at [facility], [address] [place], [place] [address]  Ferritin     Status: None   Collection Time: 11/17/23  3:22 AM  Result Value Ref Range   Ferritin 73 24 - 336 ng/mL    Comment: Performed at [facility], [address] [place], [place] [address]  Reticulocytes     Status: Abnormal   Collection Time: 11/17/23  3:22 AM  Result Value Ref Range   Retic Ct Pct 1.7 0.4 - 3.1 %   RBC. 3.07 (L)  4.22 - 5.81 MIL/uL   Retic Count, Absolute 51.9 19.0 - 186.0 K/uL   Immature Retic Fract 22.3 (H) 2.3 - 15.9 %    Comment: Performed at [facility], [address], [place], [place] [address]  Procalcitonin     Status: None   Collection Time: 11/17/23  3:22 AM  Result Value Ref Range   Procalcitonin 0.36 ng/mL    Comment:        Interpretation: PCT (Procalcitonin) <= 0.5 ng/mL: Systemic infection (sepsis) is not likely. Local bacterial infection is possible. (NOTE)       Sepsis PCT Algorithm           Lower Respiratory Tract                                      Infection PCT Algorithm    ----------------------------     ----------------------------         PCT < 0.25 ng/mL                PCT < 0.10 ng/mL          Strongly encourage             Strongly discourage   discontinuation of antibiotics    initiation of antibiotics    ----------------------------     -----------------------------       PCT 0.25 - 0.50 ng/mL            PCT 0.10 - 0.25 ng/mL               OR       >80% decrease in PCT            Discourage initiation of                                             antibiotics      Encourage discontinuation           of antibiotics    ----------------------------     -----------------------------         PCT >= 0.50 ng/mL              PCT 0.26 - 0.50 ng/mL               AND        <80% decrease in PCT             Encourage initiation of                                             antibiotics       Encourage continuation           of antibiotics    ----------------------------     -----------------------------        PCT >= 0.50 ng/mL                  PCT > 0.50 ng/mL               AND         increase in PCT  Strongly encourage                                      initiation of antibiotics    Strongly encourage escalation           of antibiotics                                     -----------------------------                                           PCT <= 0.25 ng/mL                                                 OR                                        > 80% decrease in PCT                                      Discontinue / Do not initiate                                             antibiotics  Performed at [facility], [address] [place], [place] [address]   Sedimentation rate     Status: None   Collection Time: 11/17/23  3:22 AM  Result Value Ref Range   Sed Rate 15 0 - 16 mm/hr    Comment: Performed at [facility], [address], [place], [place] [address]  C-reactive protein     Status: Abnormal   Collection Time: 11/17/23  3:22 AM  Result Value Ref Range   CRP 2.9 (H) <1.0 mg/dL    Comment: Performed at [facility], [address], [place], [place] [address]  Lactic acid, plasma     Status: Abnormal   Collection Time: 11/17/23  3:22 AM  Result Value Ref Range   Lactic Acid, Venous 2.3 (HH) 0.5 - 1.9 mmol/L    Comment: CRITICAL RESULT CALLED TO, READ BACK BY AND VERIFIED WITH Renetta Chalk. RN @ 0345 11/17/23 JBUTLER Performed at [facility], [address] [place], [place] [address]   Troponin I (High Sensitivity)     Status: Abnormal   Collection Time: 11/17/23  3:22 AM  Result Value Ref Range   Troponin I (High Sensitivity) 2,043 (HH) <18 ng/L    Comment: CRITICAL VALUE NOTED. VALUE IS CONSISTENT WITH PREVIOUSLY REPORTED/CALLED VALUE (NOTE) Elevated high sensitivity troponin I (hsTnI) values and significant  changes across serial measurements may suggest ACS but many other  chronic and acute conditions are known to elevate hsTnI results.  Refer to the "Links" section for chest pain algorithms and additional  guidance. Performed at [facility], [address], [place], [place] [address]   Urinalysis, Routine w reflex microscopic -Urine, Catheterized     Status: Abnormal   Collection Time: 11/17/23  4:07 AM  Result Value Ref Range   Color, Urine YELLOW YELLOW   APPearance HAZY (A) CLEAR   Specific Gravity, Urine 1.020 1.005 - 1.030   pH 5.0 5.0 - 8.0   Glucose, UA 50 (A) NEGATIVE mg/dL   Hgb urine dipstick LARGE (A) NEGATIVE   Bilirubin Urine NEGATIVE NEGATIVE   Ketones, ur 20 (A) NEGATIVE mg/dL   Protein, ur 604 (A) NEGATIVE mg/dL   Nitrite NEGATIVE NEGATIVE   Leukocytes,Ua NEGATIVE NEGATIVE   RBC / HPF 6-10 0 - 5 RBC/hpf   WBC, UA 0-5 0 - 5 WBC/hpf   Bacteria, UA RARE (A) NONE SEEN   Squamous Epithelial / HPF 0-5 0 - 5 /HPF   Mucus PRESENT     Comment: Performed at [facility], [address], [place], [place] [address]  Potassium     Status: None   Collection Time: 11/17/23  4:31 AM  Result Value Ref Range   Potassium 5.1 3.5 - 5.1 mmol/L    Comment: Performed at [facility], [address] [place], [place] [address]  Lactic acid, plasma     Status: Abnormal   Collection Time: 11/17/23  4:31 AM  Result Value Ref Range   Lactic Acid, Venous 2.1 (HH) 0.5 - 1.9 mmol/L    Comment: CRITICAL VALUE NOTED. VALUE IS CONSISTENT WITH PREVIOUSLY REPORTED/CALLED VALUE Performed at [facility], [address], [place], [place] [address]   D-dimer, quantitative     Status: Abnormal   Collection Time: 11/17/23  4:31 AM  Result Value Ref Range   D-Dimer, Quant 5.69 (H) 0.00 - 0.50 ug/mL-FEU    Comment: (NOTE) At the manufacturer cut-off value of 0.5 g/mL FEU, this assay has a negative predictive value of 95-100%.This assay is intended for use in conjunction with a clinical pretest probability (PTP) assessment model to exclude pulmonary embolism (PE) and deep venous thrombosis (DVT) in outpatients suspected of PE or DVT. Results should be correlated with clinical presentation. Performed at [facility], [address] [place], [place] [address]   Troponin I (High Sensitivity)     Status: Abnormal   Collection Time: 11/17/23  4:31 AM  Result Value Ref Range   Troponin I (High Sensitivity) 2,087 (HH) <18 ng/L    Comment: CRITICAL VALUE NOTED. VALUE IS CONSISTENT WITH PREVIOUSLY REPORTED/CALLED VALUE (NOTE) Elevated high sensitivity troponin I (hsTnI) values and significant  changes across serial measurements may suggest ACS but many other  chronic and acute conditions are known to elevate hsTnI results.  Refer to the "Links" section for chest pain algorithms and additional  guidance. Performed at [facility], [address], [place], [place] [address]   Hepatic function panel     Status: Abnormal   Collection Time: 11/17/23  4:31 AM  Result Value Ref Range   Total Protein 6.4 (L) 6.5 - 8.1 g/dL   Albumin 3.4 (L) 3.5 - 5.0 g/dL   AST 67 (H) 15 - 41 U/L   ALT 31 0 - 44 U/L   Alkaline Phosphatase 50 38 - 126 U/L   Total Bilirubin 2.4 (H) 0.0 - 1.2 mg/dL   Bilirubin, Direct 0.3 (H) 0.0 - 0.2 mg/dL   Indirect Bilirubin 2.1 (H) 0.3 - 0.9 mg/dL    Comment:  Performed at [facility] Lab, [address], [place], [place] [address]   US RENAL Result Date: 11/17/2023 CLINICAL DATA:  AKI EXAM: RENAL / URINARY TRACT ULTRASOUND COMPLETE COMPARISON:   None Available. FINDINGS: Right Kidney: Renal measurements: 8.4 x 5.5 x 3.7 cm = volume: 90 mL. Echogenicity within normal limits. No mass or hydronephrosis visualized. Left Kidney: Renal measurements: 9.4 x 5.0 x 3.9 cm = volume: 95 mL. Echogenicity within normal limits. No mass or hydronephrosis visualized. Bladder: Appears normal for degree of bladder distention. Prostate indents the bladder floor. Other: None. IMPRESSION: Unremarkable ultrasound of the kidneys. Electronically Signed   By: Minerva Fester M.D.   On: 11/17/2023 03:55   MR BRAIN WO CONTRAST Result Date: 11/17/2023 CLINICAL DATA:  Initial evaluation for neuro deficit, stroke suspected. EXAM: MRI HEAD WITHOUT CONTRAST MRA HEAD WITHOUT CONTRAST MRA NECK WITHOUT CONTRAST TECHNIQUE: Multiplanar, multiecho pulse sequences of the brain and surrounding structures were obtained without intravenous contrast. Angiographic images of the Circle of Willis were obtained using MRA technique without intravenous contrast. Angiographic images of the neck were obtained using MRA technique without intravenous contrast. Carotid stenosis measurements (when applicable) are obtained utilizing NASCET criteria, using the distal internal carotid diameter as the denominator. COMPARISON:  CT from 11/16/2023. FINDINGS: MRI HEAD FINDINGS Brain: Examination moderately degraded by motion artifact. Diffuse prominence of the CSF containing spaces compatible with generalized cerebral atrophy. Patchy T2/FLAIR hyperintensity involving the periventricular and deep white matter both cerebral hemispheres, consistent chronic small vessel ischemic disease, mild in nature. 6 mm focus of restricted diffusion involving the deep white matter of the left frontal centrum semi ovale, consistent with a small acute ischemic infarct (series 5, image 39). No associated hemorrhage or mass effect. No other evidence for acute or subacute ischemia. Note made of mild diffusion signal abnormality  involving the parasagittal right frontal lobe, felt to be consistent with artifact due to adjacent dural calcifications. Gray-white matter differentiation maintained. No acute intracranial hemorrhage. A small focus of chronic hemosiderin staining noted at the right occipital lobe (series 9, image 50). No mass lesion, midline shift or mass effect. No hydrocephalus or extra-axial fluid collection. Pituitary gland and suprasellar region within normal limits. Vascular: Major intracranial vascular flow voids are maintained. Skull and upper cervical spine: Craniocervical junction within normal limits. Bone marrow signal intensity normal. Small calcified nodular density noted at the right frontal scalp, of doubtful significance. Sinuses/Orbits: Prior ocular lens replacement on the right. Paranasal sinuses are largely clear. Trace left mastoid effusion noted, of doubtful significance. Other: None. MRA HEAD FINDINGS ANTERIOR CIRCULATION: Examination moderately degraded by motion artifact. Both internal carotid arteries are patent through the siphons without significant stenosis or other abnormality. A1 segments patent bilaterally. Grossly normal anterior communicating artery complex. Both ACAs grossly patent without visible stenosis. No M1 stenosis. No visible proximal MCA branch occlusion. Distal MCA branches grossly perfused and symmetric. POSTERIOR CIRCULATION: Both V4 segments patent without stenosis. Left vertebral artery slightly dominant. Left PICA patent. Right PICA not seen. Basilar patent without stenosis. Superior cerebellar and posterior cerebral arteries patent bilaterally. No intracranial aneurysm. MRA NECK FINDINGS AORTIC ARCH: Examination moderately to severely degraded by motion artifact. Visualized aortic arch grossly within normal limits for caliber. Origin of the great vessels not well assessed on this exam. RIGHT CAROTID SYSTEM: Right common and internal carotid arteries are patent with antegrade flow.  No visible hemodynamically significant stenosis about the right carotid artery system. LEFT CAROTID SYSTEM: Left common and internal carotid arteries are  patent with antegrade flow. No visible hemodynamically significant stenosis about the left carotid artery system. VERTEBRAL ARTERIES: Both vertebral arteries arise from subclavian arteries. Left vertebral artery dominant. Vertebral arteries are patent with antegrade flow. No visible hemodynamically significant stenosis. Other: None. IMPRESSION: MRI HEAD: 1. Motion degraded exam. 2. 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 3. Underlying cerebral atrophy with mild chronic small vessel ischemic disease. MRA HEAD: 1. Motion degraded exam. 2. Negative intracranial MRA for large vessel occlusion. No hemodynamically significant or correctable stenosis. MRA NECK: 1. Motion degraded exam. 2. Negative MRA of the neck for large vessel occlusion. No visible hemodynamically significant stenosis. Electronically Signed   By: Rise Mu M.D.   On: 11/17/2023 01:32   MR ANGIO HEAD WO CONTRAST Result Date: 11/17/2023 CLINICAL DATA:  Initial evaluation for neuro deficit, stroke suspected. EXAM: MRI HEAD WITHOUT CONTRAST MRA HEAD WITHOUT CONTRAST MRA NECK WITHOUT CONTRAST TECHNIQUE: Multiplanar, multiecho pulse sequences of the brain and surrounding structures were obtained without intravenous contrast. Angiographic images of the Circle of Willis were obtained using MRA technique without intravenous contrast. Angiographic images of the neck were obtained using MRA technique without intravenous contrast. Carotid stenosis measurements (when applicable) are obtained utilizing NASCET criteria, using the distal internal carotid diameter as the denominator. COMPARISON:  CT from 11/16/2023. FINDINGS: MRI HEAD FINDINGS Brain: Examination moderately degraded by motion artifact. Diffuse prominence of the CSF  containing spaces compatible with generalized cerebral atrophy. Patchy T2/FLAIR hyperintensity involving the periventricular and deep white matter both cerebral hemispheres, consistent chronic small vessel ischemic disease, mild in nature. 6 mm focus of restricted diffusion involving the deep white matter of the left frontal centrum semi ovale, consistent with a small acute ischemic infarct (series 5, image 39). No associated hemorrhage or mass effect. No other evidence for acute or subacute ischemia. Note made of mild diffusion signal abnormality involving the parasagittal right frontal lobe, felt to be consistent with artifact due to adjacent dural calcifications. Gray-white matter differentiation maintained. No acute intracranial hemorrhage. A small focus of chronic hemosiderin staining noted at the right occipital lobe (series 9, image 50). No mass lesion, midline shift or mass effect. No hydrocephalus or extra-axial fluid collection. Pituitary gland and suprasellar region within normal limits. Vascular: Major intracranial vascular flow voids are maintained. Skull and upper cervical spine: Craniocervical junction within normal limits. Bone marrow signal intensity normal. Small calcified nodular density noted at the right frontal scalp, of doubtful significance. Sinuses/Orbits: Prior ocular lens replacement on the right. Paranasal sinuses are largely clear. Trace left mastoid effusion noted, of doubtful significance. Other: None. MRA HEAD FINDINGS ANTERIOR CIRCULATION: Examination moderately degraded by motion artifact. Both internal carotid arteries are patent through the siphons without significant stenosis or other abnormality. A1 segments patent bilaterally. Grossly normal anterior communicating artery complex. Both ACAs grossly patent without visible stenosis. No M1 stenosis. No visible proximal MCA branch occlusion. Distal MCA branches grossly perfused and symmetric. POSTERIOR CIRCULATION: Both V4 segments  patent without stenosis. Left vertebral artery slightly dominant. Left PICA patent. Right PICA not seen. Basilar patent without stenosis. Superior cerebellar and posterior cerebral arteries patent bilaterally. No intracranial aneurysm. MRA NECK FINDINGS AORTIC ARCH: Examination moderately to severely degraded by motion artifact. Visualized aortic arch grossly within normal limits for caliber. Origin of the great vessels not well assessed on this exam. RIGHT CAROTID SYSTEM: Right common and internal carotid arteries are patent with antegrade flow. No visible hemodynamically significant stenosis about  the right carotid artery system. LEFT CAROTID SYSTEM: Left common and internal carotid arteries are patent with antegrade flow. No visible hemodynamically significant stenosis about the left carotid artery system. VERTEBRAL ARTERIES: Both vertebral arteries arise from subclavian arteries. Left vertebral artery dominant. Vertebral arteries are patent with antegrade flow. No visible hemodynamically significant stenosis. Other: None. IMPRESSION: MRI HEAD: 1. Motion degraded exam. 2. 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 3. Underlying cerebral atrophy with mild chronic small vessel ischemic disease. MRA HEAD: 1. Motion degraded exam. 2. Negative intracranial MRA for large vessel occlusion. No hemodynamically significant or correctable stenosis. MRA NECK: 1. Motion degraded exam. 2. Negative MRA of the neck for large vessel occlusion. No visible hemodynamically significant stenosis. Electronically Signed   By: Rise Mu M.D.   On: 11/17/2023 01:32   MR ANGIO NECK WO CONTRAST Result Date: 11/17/2023 CLINICAL DATA:  Initial evaluation for neuro deficit, stroke suspected. EXAM: MRI HEAD WITHOUT CONTRAST MRA HEAD WITHOUT CONTRAST MRA NECK WITHOUT CONTRAST TECHNIQUE: Multiplanar, multiecho pulse sequences of the brain and surrounding structures  were obtained without intravenous contrast. Angiographic images of the Circle of Willis were obtained using MRA technique without intravenous contrast. Angiographic images of the neck were obtained using MRA technique without intravenous contrast. Carotid stenosis measurements (when applicable) are obtained utilizing NASCET criteria, using the distal internal carotid diameter as the denominator. COMPARISON:  CT from 11/16/2023. FINDINGS: MRI HEAD FINDINGS Brain: Examination moderately degraded by motion artifact. Diffuse prominence of the CSF containing spaces compatible with generalized cerebral atrophy. Patchy T2/FLAIR hyperintensity involving the periventricular and deep white matter both cerebral hemispheres, consistent chronic small vessel ischemic disease, mild in nature. 6 mm focus of restricted diffusion involving the deep white matter of the left frontal centrum semi ovale, consistent with a small acute ischemic infarct (series 5, image 39). No associated hemorrhage or mass effect. No other evidence for acute or subacute ischemia. Note made of mild diffusion signal abnormality involving the parasagittal right frontal lobe, felt to be consistent with artifact due to adjacent dural calcifications. Gray-white matter differentiation maintained. No acute intracranial hemorrhage. A small focus of chronic hemosiderin staining noted at the right occipital lobe (series 9, image 50). No mass lesion, midline shift or mass effect. No hydrocephalus or extra-axial fluid collection. Pituitary gland and suprasellar region within normal limits. Vascular: Major intracranial vascular flow voids are maintained. Skull and upper cervical spine: Craniocervical junction within normal limits. Bone marrow signal intensity normal. Small calcified nodular density noted at the right frontal scalp, of doubtful significance. Sinuses/Orbits: Prior ocular lens replacement on the right. Paranasal sinuses are largely clear. Trace left  mastoid effusion noted, of doubtful significance. Other: None. MRA HEAD FINDINGS ANTERIOR CIRCULATION: Examination moderately degraded by motion artifact. Both internal carotid arteries are patent through the siphons without significant stenosis or other abnormality. A1 segments patent bilaterally. Grossly normal anterior communicating artery complex. Both ACAs grossly patent without visible stenosis. No M1 stenosis. No visible proximal MCA branch occlusion. Distal MCA branches grossly perfused and symmetric. POSTERIOR CIRCULATION: Both V4 segments patent without stenosis. Left vertebral artery slightly dominant. Left PICA patent. Right PICA not seen. Basilar patent without stenosis. Superior cerebellar and posterior cerebral arteries patent bilaterally. No intracranial aneurysm. MRA NECK FINDINGS AORTIC ARCH: Examination moderately to severely degraded by motion artifact. Visualized aortic arch grossly within normal limits for caliber. Origin of the great vessels not well assessed on this exam. RIGHT CAROTID SYSTEM: Right common  and internal carotid arteries are patent with antegrade flow. No visible hemodynamically significant stenosis about the right carotid artery system. LEFT CAROTID SYSTEM: Left common and internal carotid arteries are patent with antegrade flow. No visible hemodynamically significant stenosis about the left carotid artery system. VERTEBRAL ARTERIES: Both vertebral arteries arise from subclavian arteries. Left vertebral artery dominant. Vertebral arteries are patent with antegrade flow. No visible hemodynamically significant stenosis. Other: None. IMPRESSION: MRI HEAD: 1. Motion degraded exam. 2. 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 3. Underlying cerebral atrophy with mild chronic small vessel ischemic disease. MRA HEAD: 1. Motion degraded exam. 2. Negative intracranial MRA for large vessel occlusion. No hemodynamically  significant or correctable stenosis. MRA NECK: 1. Motion degraded exam. 2. Negative MRA of the neck for large vessel occlusion. No visible hemodynamically significant stenosis. Electronically Signed   By: Rise Mu M.D.   On: 11/17/2023 01:32   CT Head Wo Contrast Result Date: 11/16/2023 CLINICAL DATA:  Neuro deficit, acute, stroke suspected right sided weakness EXAM: CT HEAD WITHOUT CONTRAST TECHNIQUE: Contiguous axial images were obtained from the base of the skull through the vertex without intravenous contrast. RADIATION DOSE REDUCTION: This exam was performed according to the departmental dose-optimization program which includes automated exposure control, adjustment of the mA and/or kV according to patient size and/or use of iterative reconstruction technique. COMPARISON:  None Available. FINDINGS: Brain: Normal anatomic configuration. Parenchymal volume loss is commensurate with the patient's age. Mild periventricular white matter changes are present likely reflecting the sequela of small vessel ischemia. No abnormal intra or extra-axial mass lesion or fluid collection. No abnormal mass effect or midline shift. No evidence of acute intracranial hemorrhage or infarct. Ventricular size is normal. Cerebellum unremarkable. Vascular: No asymmetric hyperdense vasculature at the skull base. Skull: Intact Sinuses/Orbits: Paranasal sinuses are clear. Orbits are unremarkable. Other: Mastoid air cells and middle ear cavities are clear. IMPRESSION: 1. No acute intracranial hemorrhage or infarct. 2. Mild senescent change. Electronically Signed   By: Helyn Numbers M.D.   On: 11/16/2023 20:51   DG Chest Portable 1 View Result Date: 11/16/2023 CLINICAL DATA:  Aspiration evaluation. EXAM: PORTABLE CHEST 1 VIEW COMPARISON:  07/02/2022 FINDINGS: Prior median sternotomy and CABG. Mild cardiomegaly. Mediastinal contours within normal limits. Aortic atherosclerosis. No confluent airspace opacities or effusions.  No acute bony abnormality. IMPRESSION: Cardiomegaly.  No active disease. Electronically Signed   By: Charlett Nose M.D.   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 60%)  05/29/2013   Diabetes mellitus (HCC) 05/31/2013   Dyslipidemia, goal LDL below 70 05/31/2013   Left bundle branch block    STEMI (ST elevation myocardial infarction)- CFX BMS, 05/28/2013 05/29/2013   STEMI     PSH:   Past Surgical History:  Procedure Laterality Date   CORONARY ARTERY BYPASS GRAFT N/A 07/07/2013   Procedure: CORONARY ARTERY BYPASS GRAFTING (CABG);  Surgeon: Loreli Slot, MD;  Location: [facility] OR;  Service: Open Heart Surgery;  Laterality: N/A;  CABG X 2   LEFT HEART CATH N/A 05/28/2013   Procedure: LEFT HEART CATH;  Surgeon: Runell Gess, MD;  Location: [facility] CATH LAB;  Service: Cardiovascular;  Laterality: N/A;   NO PAST SURGERIES     PERCUTANEOUS CORONARY STENT INTERVENTION (PCI-S)  05/28/2013   Procedure: PERCUTANEOUS CORONARY STENT INTERVENTION (PCI-S);  Surgeon: Runell Gess,  MD;  Location: [facility] CATH LAB;  Service: Cardiovascular;;  prox cx    Allergies: No Known Allergies  Medications:   Prior to Admission medications   Medication Sig Start Date End Date Taking? Authorizing Provider  aspirin EC 81 MG EC tablet Take 1 tablet (81 mg total) by mouth daily. 05/31/13   Leone Brand, NP  atorvastatin (LIPITOR) 80 MG tablet TAKE 1 TABLET BY MOUTH ONCE DAILY AT 6 PM 08/11/23   Rollene Rotunda, MD  glimepiride (AMARYL) 2 MG tablet Take 2 mg by mouth daily with breakfast.    [provider]  metFORMIN (GLUCOPHAGE) 1000 MG tablet Take 1,000 mg by mouth 2 (two) times daily with a meal.    [provider]  metoprolol tartrate (LOPRESSOR) 25 MG tablet Take 1/2 (one-half) tablet by mouth twice daily Patient taking differently: Take 12.5 mg by mouth 2 (two) times daily. 08/27/23   Rollene Rotunda, MD  pantoprazole  (PROTONIX) 40 MG tablet Take 1 tablet (40 mg total) by mouth daily. 09/03/23   Rollene Rotunda, MD    Discontinued Meds:   Medications Discontinued During This Encounter  Medication Reason   Vancomycin (VANCOCIN) 1,500 mg in sodium chloride 0.9 % 500 mL IVPB Duplicate   dexmedetomidine (PRECEDEX) 400 MCG/100ML (4 mcg/mL) infusion    0.9 %  sodium chloride infusion    heparin injection 5,000 Units     Social History:  reports that he has never smoked. He has never used smokeless tobacco. He reports current alcohol use of about 1.0 standard drink of alcohol per week. He reports that he does not use drugs.  Family History:   Family History  Problem Relation Age of Onset   Healthy Mother        NO CARDIAC RELATED HEALTH ISSUES    Healthy Father        NO CARDIAC RELATED HEALTH ISSUES    Stroke Father    Healthy Sister        NO CARDIAC RELATED HEALTH ISSUES    Heart attack Neg Hx    Hypertension Neg Hx     Blood pressure (!) 170/61, pulse (!) 50, temperature 98.1 F (36.7 C), resp. rate (!) 28, height 5\' 5"  (1.651 m), weight 64.1 kg, SpO2 100%. General appearance: appears stated age, distracted, and slowed mentation Head: Normocephalic, without obvious abnormality, atraumatic Eyes: Left gaze deviation Neck: no adenopathy, no carotid bruit, supple, symmetrical, trachea midline, and thyroid not enlarged, symmetric, no tenderness/mass/nodules Back: symmetric, no curvature. ROM normal. No CVA tenderness. Resp: clear to auscultation bilaterally Cardio: brady GI: soft, non-tender; bowel sounds normal; no masses,  no organomegaly Male genitalia: normal, foley in place Extremities: edema none, really moving the right side is wiggling his toes Pulses: 2+ and symmetric       Iracema Lanagan, Len Blalock, MD 11/17/2023, 7:06 AM

## 2023-11-17 NOTE — H&P (Addendum)
History and Physical    Steve Hickman ZOX:[id number] DOB: 12-05-1940 DOA: 11/16/2023  PCP: Philemon Kingdom, MD  Patient coming from: home  I have personally briefly reviewed patient's old medical records in [facility]  Chief Complaint: confusion / found down/ right sided weakness  HPI: Steve Hickman is a 83 y.o. male with medical history significant of   CAD s/p CABG x 2 (LIMA-LAD, SV-PDA) 07/07/13 (EF 60%), DM, Dyslipidemia, LBBB and prior STEMI who was BIB [facility] from home after ex wife called for AMS . Please note  patient is unable to give history due to confusion.  Per chart patient ex wife last spoke to patient around 10 pm on 11/15/23 and at that time he was mildly confused and speech was garbled. This am patient was found down by ex wife in his home contracted on right side and unable to speak. He was noted to be very confused/nonverbal/ as well as hypotensive by ems. Patient in field was given 200cc bolus prior to arrival.Patient currently remains confused and nonverbal, does react to voice and attempts to speak.  ROS unable to be obtained.  ED Course:  IN ED patient wast noted to be confused and noted not to have clear speech. He was also noted to have right sided weakness and left gaze deviation. CTH negative.MRI  notable for . 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 MRA head and neck:Negative intracranial MRA for large vessel occlusion. No hemodynamically significant or correctable stenosis.Patient was continued on home ASA and statin  and plavix 75 mg po was added to regimen.  Further labs: Wbc 20.7 with left shift, hgb 10.5 ( 12.9 one year ago ) , MCV 108,  plt 169  Na 146, K 6.1 repeat s/p tx 5.8, bicarb 10,9, glu 170 cr 2.86  (1.47) AG 25 CE1,018,1021,1138,1286  mag 1.1 CK 1021  Cxr:NAD EKG: junctional rhythm tented  T, QT prolonged 540  Tx NS 1L, hyperkalemia  protocol,zosyn,vanc  Review of Systems: As per HPI otherwise 10 point review of systems negative.   Past Medical History:  Diagnosis Date   CAD s/p CABG x 2 (LIMA-LAD, SV-PDA) 07/07/13 (EF 60%)  05/29/2013   Diabetes mellitus (HCC) 05/31/2013   Dyslipidemia, goal LDL below 70 05/31/2013   Left bundle branch block    STEMI (ST elevation myocardial infarction)- CFX BMS, 05/28/2013 05/29/2013   STEMI     Past Surgical History:  Procedure Laterality Date   CORONARY ARTERY BYPASS GRAFT N/A 07/07/2013   Procedure: CORONARY ARTERY BYPASS GRAFTING (CABG);  Surgeon: Loreli Slot, MD;  Location: [facility] OR;  Service: Open Heart Surgery;  Laterality: N/A;  CABG X 2   LEFT HEART CATH N/A 05/28/2013   Procedure: LEFT HEART CATH;  Surgeon: Runell Gess, MD;  Location: [facility] CATH LAB;  Service: Cardiovascular;  Laterality: N/A;   NO PAST SURGERIES     PERCUTANEOUS CORONARY STENT INTERVENTION (PCI-S)  05/28/2013   Procedure: PERCUTANEOUS CORONARY STENT INTERVENTION (PCI-S);  Surgeon: Runell Gess, MD;  Location: [facility] CATH LAB;  Service: Cardiovascular;;  prox cx     reports that he has never smoked. He has never used smokeless tobacco. He reports current alcohol use of about 1.0 standard drink of alcohol per week. He reports that he does not use drugs.  No Known Allergies  Family History  Problem Relation Age of Onset   Healthy Mother  NO CARDIAC RELATED HEALTH ISSUES    Healthy Father        NO CARDIAC RELATED HEALTH ISSUES    Stroke Father    Healthy Sister        NO CARDIAC RELATED HEALTH ISSUES    Heart attack Neg Hx    Hypertension Neg Hx     Prior to Admission medications   Medication Sig Start Date End Date Taking? Authorizing Provider  aspirin EC 81 MG EC tablet Take 1 tablet (81 mg total) by mouth daily. 05/31/13   Leone Brand, NP  atorvastatin (LIPITOR) 80 MG tablet TAKE 1 TABLET BY MOUTH ONCE DAILY AT 6 PM 08/11/23   Rollene Rotunda, MD  glimepiride (AMARYL) 2 MG tablet Take 2  mg by mouth daily with breakfast.    [provider]  metFORMIN (GLUCOPHAGE) 1000 MG tablet Take 1,000 mg by mouth 2 (two) times daily with a meal.    [provider]  metoprolol tartrate (LOPRESSOR) 25 MG tablet Take 1/2 (one-half) tablet by mouth twice daily Patient taking differently: Take 12.5 mg by mouth 2 (two) times daily. 08/27/23   Rollene Rotunda, MD  pantoprazole (PROTONIX) 40 MG tablet Take 1 tablet (40 mg total) by mouth daily. 09/03/23   Rollene Rotunda, MD    Physical Exam: Vitals:   11/16/23 2330 11/16/23 2345 11/17/23 0007 11/17/23 0135  BP: (!) 152/59 139/70  (!) 177/71  Pulse: (!) 55 (!) 55  62  Resp: (!) 25 (!) 26  16  Temp:   98.2 F (36.8 C)   TempSrc:   Temporal   SpO2: 100% 100%  99%    Constitutional: mildly agitated  Vitals:   11/16/23 2330 11/16/23 2345 11/17/23 0007 11/17/23 0135  BP: (!) 152/59 139/70  (!) 177/71  Pulse: (!) 55 (!) 55  62  Resp: (!) 25 (!) 26  16  Temp:   98.2 F (36.8 C)   TempSrc:   Temporal   SpO2: 100% 100%  99%   Eyes: pupils equal reactive  ENMT: Mucous membranes dry. \ Neck: normal, supple, no masses, no thyromegaly Respiratory: congested upper airway sounds Mild increase respiratory effort. No accessory muscle use.  Cardiovascular: Regular rate and rhythm, no murmurs / rubs / gallops. No extremity edema. 2+ pedal pulses. Abdomen: no tenderness, no masses palpated. No hepatosplenomegaly. Bowel sounds positive.  Musculoskeletal: no clubbing / cyanosis. No joint deformity upper and lower extremities. Good ROM, no contractures. Normal muscle tone.  Skin: no rashes, lesions, ulcers. No induration Neurologic: + facial palsy.decrease strength on right , 4/5 on left  Psychiatric: unable to assess, mild agitation  Labs on Admission: I have personally reviewed following labs and imaging studies  CBC: Recent Labs  Lab 11/16/23 1937  WBC 20.7*  NEUTROABS 17.6*  HGB 10.5*  HCT 36.1*  MCV 108.7*  PLT 169    Basic Metabolic Panel: Recent Labs  Lab 11/16/23 1937 11/16/23 2352  NA 146* 143  K 6.1* 5.8*  CL 111 113*  CO2 10* 9*  GLUCOSE 170* 166*  BUN 41* 42*  CREATININE 2.86* 2.75*  CALCIUM 8.5* 7.8*  MG 1.1*  --    GFR: CrCl cannot be calculated (Unknown ideal weight.). Liver Function Tests: No results for input(s): "AST", "ALT", "ALKPHOS", "BILITOT", "PROT", "ALBUMIN" in the last 168 hours. No results for input(s): "LIPASE", "AMYLASE" in the last 168 hours. No results for input(s): "AMMONIA" in the last 168 hours. Coagulation Profile: No results for input(s): "INR", "PROTIME" in  the last 168 hours. Cardiac Enzymes: Recent Labs  Lab 11/16/23 1937  CKTOTAL 1,021*   BNP (last 3 results) No results for input(s): "PROBNP" in the last 8760 hours. HbA1C: No results for input(s): "HGBA1C" in the last 72 hours. CBG: Recent Labs  Lab 11/16/23 2136  GLUCAP 144*   Lipid Profile: Recent Labs    11/16/23 1937  CHOL 77  HDL 36*  LDLCALC 20  TRIG 536  CHOLHDL 2.1   Thyroid Function Tests: No results for input(s): "TSH", "T4TOTAL", "FREET4", "T3FREE", "THYROIDAB" in the last 72 hours. Anemia Panel: No results for input(s): "VITAMINB12", "FOLATE", "FERRITIN", "TIBC", "IRON", "RETICCTPCT" in the last 72 hours. Urine analysis:    Component Value Date/Time   COLORURINE YELLOW 07/05/2013 1106   APPEARANCEUR CLEAR 07/05/2013 1106   LABSPEC 1.027 07/05/2013 1106   PHURINE 5.5 07/05/2013 1106   GLUCOSEU 250 (A) 07/05/2013 1106   HGBUR SMALL (A) 07/05/2013 1106   BILIRUBINUR SMALL (A) 07/05/2013 1106   KETONESUR NEGATIVE 07/05/2013 1106   PROTEINUR NEGATIVE 07/05/2013 1106   UROBILINOGEN 0.2 07/05/2013 1106   NITRITE NEGATIVE 07/05/2013 1106   LEUKOCYTESUR NEGATIVE 07/05/2013 1106    Radiological Exams on Admission: MR BRAIN WO CONTRAST Result Date: 11/17/2023 CLINICAL DATA:  Initial evaluation for neuro deficit, stroke suspected. EXAM: MRI HEAD WITHOUT CONTRAST MRA HEAD  WITHOUT CONTRAST MRA NECK WITHOUT CONTRAST TECHNIQUE: Multiplanar, multiecho pulse sequences of the brain and surrounding structures were obtained without intravenous contrast. Angiographic images of the Circle of Willis were obtained using MRA technique without intravenous contrast. Angiographic images of the neck were obtained using MRA technique without intravenous contrast. Carotid stenosis measurements (when applicable) are obtained utilizing NASCET criteria, using the distal internal carotid diameter as the denominator. COMPARISON:  CT from 11/16/2023. FINDINGS: MRI HEAD FINDINGS Brain: Examination moderately degraded by motion artifact. Diffuse prominence of the CSF containing spaces compatible with generalized cerebral atrophy. Patchy T2/FLAIR hyperintensity involving the periventricular and deep white matter both cerebral hemispheres, consistent chronic small vessel ischemic disease, mild in nature. 6 mm focus of restricted diffusion involving the deep white matter of the left frontal centrum semi ovale, consistent with a small acute ischemic infarct (series 5, image 39). No associated hemorrhage or mass effect. No other evidence for acute or subacute ischemia. Note made of mild diffusion signal abnormality involving the parasagittal right frontal lobe, felt to be consistent with artifact due to adjacent dural calcifications. Gray-white matter differentiation maintained. No acute intracranial hemorrhage. A small focus of chronic hemosiderin staining noted at the right occipital lobe (series 9, image 50). No mass lesion, midline shift or mass effect. No hydrocephalus or extra-axial fluid collection. Pituitary gland and suprasellar region within normal limits. Vascular: Major intracranial vascular flow voids are maintained. Skull and upper cervical spine: Craniocervical junction within normal limits. Bone marrow signal intensity normal. Small calcified nodular density noted at the right frontal scalp, of  doubtful significance. Sinuses/Orbits: Prior ocular lens replacement on the right. Paranasal sinuses are largely clear. Trace left mastoid effusion noted, of doubtful significance. Other: None. MRA HEAD FINDINGS ANTERIOR CIRCULATION: Examination moderately degraded by motion artifact. Both internal carotid arteries are patent through the siphons without significant stenosis or other abnormality. A1 segments patent bilaterally. Grossly normal anterior communicating artery complex. Both ACAs grossly patent without visible stenosis. No M1 stenosis. No visible proximal MCA branch occlusion. Distal MCA branches grossly perfused and symmetric. POSTERIOR CIRCULATION: Both V4 segments patent without stenosis. Left vertebral artery slightly dominant. Left PICA patent. Right PICA not  seen. Basilar patent without stenosis. Superior cerebellar and posterior cerebral arteries patent bilaterally. No intracranial aneurysm. MRA NECK FINDINGS AORTIC ARCH: Examination moderately to severely degraded by motion artifact. Visualized aortic arch grossly within normal limits for caliber. Origin of the great vessels not well assessed on this exam. RIGHT CAROTID SYSTEM: Right common and internal carotid arteries are patent with antegrade flow. No visible hemodynamically significant stenosis about the right carotid artery system. LEFT CAROTID SYSTEM: Left common and internal carotid arteries are patent with antegrade flow. No visible hemodynamically significant stenosis about the left carotid artery system. VERTEBRAL ARTERIES: Both vertebral arteries arise from subclavian arteries. Left vertebral artery dominant. Vertebral arteries are patent with antegrade flow. No visible hemodynamically significant stenosis. Other: None. IMPRESSION: MRI HEAD: 1. Motion degraded exam. 2. 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 3. Underlying cerebral atrophy with mild chronic small vessel  ischemic disease. MRA HEAD: 1. Motion degraded exam. 2. Negative intracranial MRA for large vessel occlusion. No hemodynamically significant or correctable stenosis. MRA NECK: 1. Motion degraded exam. 2. Negative MRA of the neck for large vessel occlusion. No visible hemodynamically significant stenosis. Electronically Signed   By: Rise Mu M.D.   On: 11/17/2023 01:32   MR ANGIO HEAD WO CONTRAST Result Date: 11/17/2023 CLINICAL DATA:  Initial evaluation for neuro deficit, stroke suspected. EXAM: MRI HEAD WITHOUT CONTRAST MRA HEAD WITHOUT CONTRAST MRA NECK WITHOUT CONTRAST TECHNIQUE: Multiplanar, multiecho pulse sequences of the brain and surrounding structures were obtained without intravenous contrast. Angiographic images of the Circle of Willis were obtained using MRA technique without intravenous contrast. Angiographic images of the neck were obtained using MRA technique without intravenous contrast. Carotid stenosis measurements (when applicable) are obtained utilizing NASCET criteria, using the distal internal carotid diameter as the denominator. COMPARISON:  CT from 11/16/2023. FINDINGS: MRI HEAD FINDINGS Brain: Examination moderately degraded by motion artifact. Diffuse prominence of the CSF containing spaces compatible with generalized cerebral atrophy. Patchy T2/FLAIR hyperintensity involving the periventricular and deep white matter both cerebral hemispheres, consistent chronic small vessel ischemic disease, mild in nature. 6 mm focus of restricted diffusion involving the deep white matter of the left frontal centrum semi ovale, consistent with a small acute ischemic infarct (series 5, image 39). No associated hemorrhage or mass effect. No other evidence for acute or subacute ischemia. Note made of mild diffusion signal abnormality involving the parasagittal right frontal lobe, felt to be consistent with artifact due to adjacent dural calcifications. Gray-white matter differentiation  maintained. No acute intracranial hemorrhage. A small focus of chronic hemosiderin staining noted at the right occipital lobe (series 9, image 50). No mass lesion, midline shift or mass effect. No hydrocephalus or extra-axial fluid collection. Pituitary gland and suprasellar region within normal limits. Vascular: Major intracranial vascular flow voids are maintained. Skull and upper cervical spine: Craniocervical junction within normal limits. Bone marrow signal intensity normal. Small calcified nodular density noted at the right frontal scalp, of doubtful significance. Sinuses/Orbits: Prior ocular lens replacement on the right. Paranasal sinuses are largely clear. Trace left mastoid effusion noted, of doubtful significance. Other: None. MRA HEAD FINDINGS ANTERIOR CIRCULATION: Examination moderately degraded by motion artifact. Both internal carotid arteries are patent through the siphons without significant stenosis or other abnormality. A1 segments patent bilaterally. Grossly normal anterior communicating artery complex. Both ACAs grossly patent without visible stenosis. No M1 stenosis. No visible proximal MCA branch occlusion. Distal MCA branches grossly perfused and symmetric. POSTERIOR CIRCULATION: Both V4  segments patent without stenosis. Left vertebral artery slightly dominant. Left PICA patent. Right PICA not seen. Basilar patent without stenosis. Superior cerebellar and posterior cerebral arteries patent bilaterally. No intracranial aneurysm. MRA NECK FINDINGS AORTIC ARCH: Examination moderately to severely degraded by motion artifact. Visualized aortic arch grossly within normal limits for caliber. Origin of the great vessels not well assessed on this exam. RIGHT CAROTID SYSTEM: Right common and internal carotid arteries are patent with antegrade flow. No visible hemodynamically significant stenosis about the right carotid artery system. LEFT CAROTID SYSTEM: Left common and internal carotid arteries are  patent with antegrade flow. No visible hemodynamically significant stenosis about the left carotid artery system. VERTEBRAL ARTERIES: Both vertebral arteries arise from subclavian arteries. Left vertebral artery dominant. Vertebral arteries are patent with antegrade flow. No visible hemodynamically significant stenosis. Other: None. IMPRESSION: MRI HEAD: 1. Motion degraded exam. 2. 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 3. Underlying cerebral atrophy with mild chronic small vessel ischemic disease. MRA HEAD: 1. Motion degraded exam. 2. Negative intracranial MRA for large vessel occlusion. No hemodynamically significant or correctable stenosis. MRA NECK: 1. Motion degraded exam. 2. Negative MRA of the neck for large vessel occlusion. No visible hemodynamically significant stenosis. Electronically Signed   By: Rise Mu M.D.   On: 11/17/2023 01:32   MR ANGIO NECK WO CONTRAST Result Date: 11/17/2023 CLINICAL DATA:  Initial evaluation for neuro deficit, stroke suspected. EXAM: MRI HEAD WITHOUT CONTRAST MRA HEAD WITHOUT CONTRAST MRA NECK WITHOUT CONTRAST TECHNIQUE: Multiplanar, multiecho pulse sequences of the brain and surrounding structures were obtained without intravenous contrast. Angiographic images of the Circle of Willis were obtained using MRA technique without intravenous contrast. Angiographic images of the neck were obtained using MRA technique without intravenous contrast. Carotid stenosis measurements (when applicable) are obtained utilizing NASCET criteria, using the distal internal carotid diameter as the denominator. COMPARISON:  CT from 11/16/2023. FINDINGS: MRI HEAD FINDINGS Brain: Examination moderately degraded by motion artifact. Diffuse prominence of the CSF containing spaces compatible with generalized cerebral atrophy. Patchy T2/FLAIR hyperintensity involving the periventricular and deep white matter both cerebral  hemispheres, consistent chronic small vessel ischemic disease, mild in nature. 6 mm focus of restricted diffusion involving the deep white matter of the left frontal centrum semi ovale, consistent with a small acute ischemic infarct (series 5, image 39). No associated hemorrhage or mass effect. No other evidence for acute or subacute ischemia. Note made of mild diffusion signal abnormality involving the parasagittal right frontal lobe, felt to be consistent with artifact due to adjacent dural calcifications. Gray-white matter differentiation maintained. No acute intracranial hemorrhage. A small focus of chronic hemosiderin staining noted at the right occipital lobe (series 9, image 50). No mass lesion, midline shift or mass effect. No hydrocephalus or extra-axial fluid collection. Pituitary gland and suprasellar region within normal limits. Vascular: Major intracranial vascular flow voids are maintained. Skull and upper cervical spine: Craniocervical junction within normal limits. Bone marrow signal intensity normal. Small calcified nodular density noted at the right frontal scalp, of doubtful significance. Sinuses/Orbits: Prior ocular lens replacement on the right. Paranasal sinuses are largely clear. Trace left mastoid effusion noted, of doubtful significance. Other: None. MRA HEAD FINDINGS ANTERIOR CIRCULATION: Examination moderately degraded by motion artifact. Both internal carotid arteries are patent through the siphons without significant stenosis or other abnormality. A1 segments patent bilaterally. Grossly normal anterior communicating artery complex. Both ACAs grossly patent without visible stenosis. No M1 stenosis. No visible  proximal MCA branch occlusion. Distal MCA branches grossly perfused and symmetric. POSTERIOR CIRCULATION: Both V4 segments patent without stenosis. Left vertebral artery slightly dominant. Left PICA patent. Right PICA not seen. Basilar patent without stenosis. Superior cerebellar and  posterior cerebral arteries patent bilaterally. No intracranial aneurysm. MRA NECK FINDINGS AORTIC ARCH: Examination moderately to severely degraded by motion artifact. Visualized aortic arch grossly within normal limits for caliber. Origin of the great vessels not well assessed on this exam. RIGHT CAROTID SYSTEM: Right common and internal carotid arteries are patent with antegrade flow. No visible hemodynamically significant stenosis about the right carotid artery system. LEFT CAROTID SYSTEM: Left common and internal carotid arteries are patent with antegrade flow. No visible hemodynamically significant stenosis about the left carotid artery system. VERTEBRAL ARTERIES: Both vertebral arteries arise from subclavian arteries. Left vertebral artery dominant. Vertebral arteries are patent with antegrade flow. No visible hemodynamically significant stenosis. Other: None. IMPRESSION: MRI HEAD: 1. Motion degraded exam. 2. 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 3. Underlying cerebral atrophy with mild chronic small vessel ischemic disease. MRA HEAD: 1. Motion degraded exam. 2. Negative intracranial MRA for large vessel occlusion. No hemodynamically significant or correctable stenosis. MRA NECK: 1. Motion degraded exam. 2. Negative MRA of the neck for large vessel occlusion. No visible hemodynamically significant stenosis. Electronically Signed   By: Rise Mu M.D.   On: 11/17/2023 01:32   CT Head Wo Contrast Result Date: 11/16/2023 CLINICAL DATA:  Neuro deficit, acute, stroke suspected right sided weakness EXAM: CT HEAD WITHOUT CONTRAST TECHNIQUE: Contiguous axial images were obtained from the base of the skull through the vertex without intravenous contrast. RADIATION DOSE REDUCTION: This exam was performed according to the departmental dose-optimization program which includes automated exposure control, adjustment of the mA and/or kV  according to patient size and/or use of iterative reconstruction technique. COMPARISON:  None Available. FINDINGS: Brain: Normal anatomic configuration. Parenchymal volume loss is commensurate with the patient's age. Mild periventricular white matter changes are present likely reflecting the sequela of small vessel ischemia. No abnormal intra or extra-axial mass lesion or fluid collection. No abnormal mass effect or midline shift. No evidence of acute intracranial hemorrhage or infarct. Ventricular size is normal. Cerebellum unremarkable. Vascular: No asymmetric hyperdense vasculature at the skull base. Skull: Intact Sinuses/Orbits: Paranasal sinuses are clear. Orbits are unremarkable. Other: Mastoid air cells and middle ear cavities are clear. IMPRESSION: 1. No acute intracranial hemorrhage or infarct. 2. Mild senescent change. Electronically Signed   By: Helyn Numbers M.D.   On: 11/16/2023 20:51   DG Chest Portable 1 View Result Date: 11/16/2023 CLINICAL DATA:  Aspiration evaluation. EXAM: PORTABLE CHEST 1 VIEW COMPARISON:  07/02/2022 FINDINGS: Prior median sternotomy and CABG. Mild cardiomegaly. Mediastinal contours within normal limits. Aortic atherosclerosis. No confluent airspace opacities or effusions. No acute bony abnormality. IMPRESSION: Cardiomegaly.  No active disease. Electronically Signed   By: Charlett Nose M.D.   On: 11/16/2023 20:02    EKG: Independently reviewed. 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 involving the left frontal centrum semi ovale -noted right sided weakness and aphasia, facial palsy  -continue on ASA 81 and high intensity statin per neurology rec -add plavix 75 mg daily per neurology rec - place on stroke protocol (PT/OT/SLP/Echo/Lipid panel neuro check q4h) - NPO  -permissive hypertension  Presumed Sepsis due to bacterial cause nos -elevated wbc,rr, lactic pending /procalcitonin  pending -continue  on broad spectrum abx with zosyn and vanc  - ivfs per protocol  -f/u with infectious work up  --de-escalate abx as able   AKI associated with Metabolic acidosis Hyperkalemia Rhabdomyolysis -hold nephrotoxic medications  - continue with ivfs, start bicarb drip  -f/u with vbg  -nephrology Dr Signe Colt consulted  -strict I/o , foley cath inserted  -f/u on UA / UC - renal u/s pendubg   Type II MI  r/o Type 1  CAD CAD s/p CABG x 2  -demand in setting of hypotension/ infection /metabolic derangements  -per neuro ok to start heparin / cards rec start heparin due to increasing CE -continue to cycle CE  -f/u with Echo  -holding metoprolol due to low blood pressure  -f/u on further cardiology rec -continue asa/plavix   Severe hypomagnesemia  -mag 1.1 -replete now -f/u on labs    Mild hypernatremia  -due to dehydration  - continue with ivfs   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

## 2023-11-17 NOTE — Consult Note (Signed)
Consultation Note Date: 11/17/2023   Patient Name: Steve Hickman  DOB: Sep 22, 1941  MRN: [id number]  Age / Sex: 83 y.o., male  PCP: Philemon Kingdom, MD Referring Physician: Hughie Closs, MD  Reason for Consultation: Establishing goals of care  HPI/Patient Profile: 83 y.o. 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 Also noted to have possible sepsis, AKI on CKD stage 3a, demand ischemia vs NSTEMI, high-grade AV block/complete heart block  Clinical Assessment and Goals of Care: Consult received and chart review completed. I met today with [name] but no family at bedside. [name] does open his eyes and appear to track at one point in my exam. He does not follow any commands. Non-verbal. Labored breathing with accessory muscle use and audible gurgling/secretions heard from doorway. I placed him on 3L nasal cannula which seemed to provide some level of relief to labored breathing (even thought sats were 95% on room air).   I was unable to initially reach his significant other, Rubin Payor. I was able to later connect with Rubin Payor and discussed with her [name] status, distress, and poor prognosis. After discussion Rubin Payor agrees to focus on comfort care. No plans for further work up or labs. No escalation of care. Will continue current care as Rubin Payor is trying to get a ride to come to the hospital to be with him. Overall priority of care is comfort and Rubin Payor agrees with utilizing comfort medications as needed and wishes to avoid suffering as much as possible.   I contacted social worker, Okey Regal, to assist with transportation but Rubin Payor has found a ride by the time Okey Regal was able to connect with her.    Update 1725:  RN  notified me when Rubin Payor has arrived. I met with Rubin Payor along with her daughter and brother at bedside. [name] appears more comfortable and relaxed. We reviewed again [name] diagnoses and poor prognosis. All agree with full comfort care at this time. Anticipate hospital death. Rubin Payor reports that they are not currently married but were going to re-marry soon. She gives me contact information for Mr. Wilbur sister, Elease Hashimoto. We did discuss the importance of considering wishes and plans for funeral arrangements.   I called and discussed with Elease Hashimoto. Elease Hashimoto has discussed with Rubin Payor and also agrees with full comfort care. Elease Hashimoto shares that she believes that Rubin Payor and her brother are still legally married. Either way they both agree with full comfort care. Elease Hashimoto plans to visit tomorrow but understands that anything can happen at anytime.   All questions/concerns addressed. Emotional support provided.   Primary Decision Maker NEXT OF KIN spouse Rubin Payor and sister Elease Hashimoto    SUMMARY OF RECOMMENDATIONS   - DNR - Full comfort care  Code Status/Advance Care Planning: DNR   Symptom Management:  Scheduled and PRN medications available for comfort.   Prognosis:  Hours - Days  Discharge Planning: Anticipated Hospital Death      Primary Diagnoses: Present on Admission:  Change in mental status   I have reviewed the medical record, interviewed the patient and family, and examined the patient. The following aspects are pertinent.  Past Medical History:  Diagnosis Date   CAD s/p CABG x 2 (LIMA-LAD, SV-PDA) 07/07/13 (EF 60%)  05/29/2013   Diabetes mellitus (HCC) 05/31/2013   Dyslipidemia, goal LDL below 70 05/31/2013   Left bundle branch block    STEMI (ST elevation myocardial infarction)- CFX BMS, 05/28/2013 05/29/2013   STEMI    Social History   Socioeconomic History   Marital status: Married    Spouse name: Not on file   Number of children: Not on file   Years of education: Not on  file   Highest education level: Not on file  Occupational History   Not on file  Tobacco Use   Smoking status: Never   Smokeless tobacco: Never  Substance and Sexual Activity   Alcohol use: Yes    Alcohol/week: 1.0 standard drink of alcohol    Types: 1 Shots of liquor per week   Drug use: No   Sexual activity: Not on file  Other Topics Concern   Not on file  Social History Narrative   Not on file   Social Drivers of Health   Financial Resource Strain: Not on file  Food Insecurity: Low Risk  (11/10/2023)   Received from [facility]   Hunger Vital Sign    Worried About Running Out of Food in the Last Year: Never true    Ran Out of Food in the Last Year: Never true  Transportation Needs: No Transportation Needs (11/10/2023)   Received from [facility]    In the past 12 months, has lack of reliable transportation kept you from medical appointments, meetings, work or from getting things needed for daily living? : No  Physical Activity: Not on file  Stress: Not on file  Social Connections: Unknown (03/11/2022)   Received from [employer]   Social Network    Social Network: Not on file   Family History  Problem Relation Age of Onset   Healthy Mother        NO CARDIAC RELATED HEALTH ISSUES    Healthy Father        NO CARDIAC RELATED HEALTH ISSUES    Stroke Father    Healthy Sister        NO CARDIAC RELATED HEALTH ISSUES    Heart attack Neg Hx    Hypertension Neg Hx    Scheduled Meds:  [START ON 11/18/2023]  stroke: early stages of recovery book   Does not apply Once   sodium bicarbonate 150 mEq in dextrose 5 % 1,150 mL infusion   Intravenous Once   Continuous Infusions:  sodium chloride 200 mL/hr at 11/17/23 1028   heparin 800 Units/hr (11/17/23 0549)   PRN Meds:.acetaminophen **OR** acetaminophen (TYLENOL) oral liquid 160 mg/5 mL **OR** acetaminophen No Known Allergies Review of Systems  Unable to perform ROS: Acuity of condition     Physical Exam Vitals and nursing note reviewed.  Constitutional:      General: He is awake. He is in acute distress.     Appearance: He is ill-appearing.  Cardiovascular:     Rate and Rhythm: Normal rate.     Comments: Wide QRS Pulmonary:     Effort: Tachypnea, accessory muscle usage and respiratory  distress present.  Abdominal:     General: Abdomen is flat.     Palpations: Abdomen is soft.  Neurological:     Mental Status: He is alert.     Comments: Non-verbal     Vital Signs: BP (!) 174/60   Pulse 80   Temp (!) 100.4 F (38 C) (Axillary)   Resp (!) 26   Ht 5\' 5"  (1.651 m)   Wt 64.1 kg   SpO2 98%   BMI 23.52 kg/m      Pain Score: Asleep   SpO2: SpO2: 98 % O2 Device:SpO2: 98 % O2 Flow Rate: .   IO: Intake/output summary:  Intake/Output Summary (Last 24 hours) at 11/17/2023 1240 Last data filed at 11/17/2023 1610 Gross per 24 hour  Intake 2547.91 ml  Output --  Net 2547.91 ml    LBM:   Baseline Weight: Weight: 64.1 kg Most recent weight: Weight: 64.1 kg     Palliative Assessment/Data:    Time Total: 80 min  Greater than 50%  of this time was spent counseling and coordinating care related to the above assessment and plan.  Signed by: Yong Channel, NP Palliative Medicine Team Pager # [phone] (M-F 8a-5p) Team Phone # [phone] (Nights/Weekends)

## 2023-11-17 NOTE — Progress Notes (Signed)
Arrived to [facility] 3W from Emergency Department.  Patient changed to comfort measures only.     11/17/23 1538  Vitals  Temp 99.6 F (37.6 C)  Temp Source Axillary  BP (!) 138/93  MAP (mmHg) 104  BP Location Right Arm  BP Method Automatic  Patient Position (if appropriate) Lying  Pulse Rate 100  Pulse Rate Source Monitor  ECG Heart Rate (!) 101  Resp (!) 28  MEWS COLOR  MEWS Score Color Comfort Care Only  Oxygen Therapy  SpO2 (!) 89 %  O2 Device Nasal Cannula  O2 Flow Rate (L/min) 2 L/min  MEWS Score  MEWS Temp 0  MEWS Systolic 0  MEWS Pulse 1  MEWS RR 2  MEWS LOC 0  MEWS Score 3

## 2023-11-17 NOTE — Progress Notes (Addendum)
Palliative:  Full note to follow. I spoke with significant other, Delray Alt. We discussed poor prognosis and measures to provide comfort and eliminate suffering. She agrees that she does not want him to suffer. She understands that he is unlikely to survive much longer. She agrees with comfort focused care. We agree that we will not escalate care, no further work up or labs. We will continue current care along with comfort measures while she is trying to get a ride to the hospital to be with him.   Update: Discussed further with Rubin Payor and Mr. Chrzanowski sister Elease Hashimoto. Full comfort care desired. Orders adjusted to reflect full comfort care.   Yong Channel, NP Palliative Medicine Team Pager [phone] (Please see [email or web address] for schedule) Team Phone [phone]

## 2023-11-17 NOTE — Procedures (Signed)
Patient Name: [name]  MRN: [id number]  Epilepsy Attending: Charlsie Quest  Referring Physician/Provider: Lurline Del, MD  Date: 11/15/2022 Duration: 23.02 mins  Patient history: 83yo M with ams and right sided weakness. EEG to evaluate for seizure  Level of alertness: Awake  AEDs during EEG study: None  Technical aspects: This EEG study was done with scalp electrodes positioned according to the 10-20 International system of electrode placement. Electrical activity was reviewed with band pass filter of 1-70Hz , sensitivity of 7 uV/mm, display speed of 50mm/sec with a 60Hz  notched filter applied as appropriate. EEG data were recorded continuously and digitally stored.  Video monitoring was available and reviewed as appropriate.  Description: The posterior dominant rhythm consists of 8  Hz activity of moderate voltage (25-35 uV) seen predominantly in posterior head regions, symmetric and reactive to eye opening and eye closing. EEG showed intermittent generalized 3 to 6 Hz theta-delta slowing. Hyperventilation and photic stimulation were not performed.     ABNORMALITY - Intermittent slow, generalized  IMPRESSION: This study is suggestive of mild diffuse encephalopathy. No seizures or epileptiform discharges were seen throughout the recording.  Steve Hickman Annabelle Harman

## 2023-11-17 NOTE — ED Notes (Signed)
Patient transported to MRI 

## 2023-11-17 NOTE — Plan of Care (Signed)
Made comfort measures during this shift. Tele monitoring removed.  Problem: Coping: Goal: Will verbalize positive feelings about self Outcome: Progressing Goal: Will identify appropriate support needs Outcome: Progressing   Problem: Health Behavior/Discharge Planning: Goal: Ability to manage health-related needs will improve Outcome: Progressing Goal: Goals will be collaboratively established with patient/family Outcome: Progressing   Problem: Education: Goal: Knowledge of the prescribed therapeutic regimen will improve Outcome: Progressing   Problem: Coping: Goal: Ability to identify and develop effective coping behavior will improve Outcome: Progressing   Problem: Clinical Measurements: Goal: Quality of life will improve Outcome: Progressing   Problem: Respiratory: Goal: Verbalizations of increased ease of respirations will increase Outcome: Progressing

## 2023-11-17 NOTE — Progress Notes (Signed)
PHARMACY - ANTICOAGULATION CONSULT NOTE  Pharmacy Consult for heparin Indication: chest pain/ACS  No Known Allergies  Patient Measurements: Height: 5\' 5"  (165.1 cm) Weight: 64.1 kg (141 lb 5 oz) IBW/kg (Calculated) : 61.5 Heparin Dosing Weight: 64 kg  Vital Signs: Temp: 98.1 F (36.7 C) (01/21 0329) Temp Source: Temporal (01/21 0007) BP: 165/65 (01/21 0430) Pulse Rate: 50 (01/21 0430)  Labs: Recent Labs    11/16/23 1937 11/16/23 2140 11/16/23 2352 11/17/23 0322  HGB 10.5*  --   --  10.0*  HCT 36.1*  --   --  31.3*  PLT 169  --   --  159  CREATININE 2.86*  --  2.75* 2.84*  CKTOTAL 1,021*  --   --  2,094*  TROPONINIHS 1,018* 1,138* 1,286* 2,043*    Estimated Creatinine Clearance: 16.8 mL/min (A) (by C-G formula based on SCr of 2.84 mg/dL (H)).   Medical History: Past Medical History:  Diagnosis Date   CAD s/p CABG x 2 (LIMA-LAD, SV-PDA) 07/07/13 (EF 60%)  05/29/2013   Diabetes mellitus (HCC) 05/31/2013   Dyslipidemia, goal LDL below 70 05/31/2013   Left bundle branch block    STEMI (ST elevation myocardial infarction)- CFX BMS, 05/28/2013 05/29/2013   STEMI    Assessment: 43 yoM presented to ED with AMS and found to have elevated tropnonins. PMH: hx of CAD (inferior STEMI 05/2013 with PCI Lcx followed by CABG 06/2013 LIMA-LAD, SVG-PD), LBBB, poorly controlled DM2, hypertension, hyperlipidemia. Pharmacy has been consulted to dose heparin for ACS.  -Hgb 10s, Plts 160s -MRI brain: 6 mm acute ischemic nonhemorrhagic deep white matter infarct  -no PTA anticoagulation   Goal of Therapy:  Heparin level 0.3-0.5 units/ml Monitor platelets by anticoagulation protocol: Yes   Plan:  -No bolus -Start heparin 800 units/hr -8h heparin level -CBC and heparin level daily  Katheren Jimmerson 11/17/2023,4:59 AM

## 2023-11-17 NOTE — Progress Notes (Signed)
Pharmacy Antibiotic Note  Steve Hickman is a 83 y.o. male admitted on 11/16/2023 with AMS now concerns for sepsis.  Pharmacy has been consulted for vancomycin dosing.  -Zosyn/Vanc x1 in ED -WBC 20 > 18, sCr 2.84 (bl ~1.5), lactate 2.1, afebrile -Blood cultures collected  Plan: -Zosyn 2.25gm IV every 8 hours  -Vancomycin 1500mg  IV x1 -Vancomycin 750mg  IV every 48 hours (AUC 442, IBW, Vd 0.72) -Monitor renal function -Follow up signs of clinical improvement, LOT, de-escalation of antibiotics   Height: 5\' 5"  (165.1 cm) Weight: 64.1 kg (141 lb 5 oz) IBW/kg (Calculated) : 61.5  Temp (24hrs), Avg:98.1 F (36.7 C), Min:98 F (36.7 C), Max:98.2 F (36.8 C)  Recent Labs  Lab 11/16/23 1937 11/16/23 2352 11/17/23 0322 11/17/23 0431  WBC 20.7*  --  18.3*  --   CREATININE 2.86* 2.75* 2.84*  --   LATICACIDVEN  --   --  2.3* 2.1*    Estimated Creatinine Clearance: 16.8 mL/min (A) (by C-G formula based on SCr of 2.84 mg/dL (H)).    No Known Allergies  Antimicrobials this admission: Zosyn 1/21 >>  Vancomycin 1/21 >>   Microbiology results: 1/21 BCx:   Thank you for allowing pharmacy to be a part of this patient's care.  Arabella Merles, PharmD. Clinical Pharmacist 11/17/2023 7:04 AM

## 2023-11-18 DIAGNOSIS — Z515 Encounter for palliative care: Secondary | ICD-10-CM | POA: Diagnosis not present

## 2023-11-18 DIAGNOSIS — N179 Acute kidney failure, unspecified: Secondary | ICD-10-CM | POA: Diagnosis not present

## 2023-11-18 MED ORDER — GLYCOPYRROLATE 0.2 MG/ML IJ SOLN
0.4000 mg | Freq: Two times a day (BID) | INTRAMUSCULAR | Status: DC
  Administered 2023-11-18 – 2023-11-19 (×2): 0.4 mg via INTRAVENOUS
  Filled 2023-11-18 (×2): qty 2

## 2023-11-18 MED ORDER — GLYCOPYRROLATE 0.2 MG/ML IJ SOLN: 0.2000 mg | INTRAMUSCULAR | Status: DC | PRN

## 2023-11-18 NOTE — Progress Notes (Signed)
The patient is transitioning to comfort measures.  Cardiology will sign off.  Cardiology treatments have been appropriately stopped.  Please reach out if you have questions.  Gerri Spore T. Flora Lipps, MD, [facility]  [facility]  [address], [address] [place], [place] [address] [phone]  9:56 AM

## 2023-11-18 NOTE — Progress Notes (Signed)
This chaplain responded to PMT NP-Alicia's consult for EOL spiritual care. The Pt. is resting comfortably at the time of the visit. The chaplain is appreciative of the updates from the Pt. RN-Miranda and NP-Alicia.   The chaplain understands the consult was placed for the Pt. wife-Edith. The chaplain is hopeful the RN will page the chaplain using the number below when family arrives at the bedside.  Chaplain Stephanie Acre [phone]

## 2023-11-18 NOTE — Plan of Care (Signed)
  Problem: Education: Goal: Knowledge of disease or condition will improve 11/18/2023 1807 by Genevie Ann, RN Outcome: Progressing 11/18/2023 1759 by Genevie Ann, RN Outcome: Progressing Goal: Knowledge of secondary prevention will improve (MUST DOCUMENT ALL) 11/18/2023 1807 by Genevie Ann, RN Outcome: Progressing 11/18/2023 1759 by Genevie Ann, RN Outcome: Progressing

## 2023-11-18 NOTE — Progress Notes (Signed)
[facility] PROGRESS NOTE  Patient: Steve Hickman ZOX:[id number]   PCP: Philemon Kingdom, MD DOB: [birth year]   DOA: 11/16/2023   DOS: 11/18/2023    Subjective: Remains comfortable.  No nausea no vomiting no fever no chills.  Objective:  Vitals:   11/17/23 1234 11/17/23 1500 11/17/23 1538 11/18/23 0825  BP:  (!) 142/62 (!) 138/93 131/76  Pulse:  84 100 100  Resp:   (!) 28 13  Temp:  99.3 F (37.4 C) 99.6 F (37.6 C) 97.7 F (36.5 C)  TempSrc:  Axillary Axillary Oral  SpO2: 97% 91% (!) 89% 92%  Weight:      Height:       Clear to auscultation. S1-S2 present.  Assessment and plan: Currently comfort care. Monitor.  Author: Lynden Oxford, MD [facility] 11/18/2023 6:09 PM   If 7PM-7AM, please contact night-coverage at [email or web address]

## 2023-11-18 NOTE — Plan of Care (Signed)
  Problem: Education: Goal: Knowledge of disease or condition will improve Outcome: Progressing Goal: Knowledge of secondary prevention will improve (MUST DOCUMENT ALL) Outcome: Progressing   

## 2023-11-18 NOTE — Plan of Care (Signed)
  Problem: Pain Management: Goal: Satisfaction with pain management regimen will improve Outcome: Progressing   

## 2023-11-18 NOTE — Progress Notes (Signed)
Palliative:  HPI: 83 y.o. 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 Also noted to have possible sepsis, AKI on CKD stage 3a, demand ischemia vs NSTEMI, high-grade AV block/complete heart block.   I reviewed records and MAR. I met today with [name]. Eyes are open but not much response. Breathing more regular, less labored - becoming more agonal. He appears to be resting comfortably. Bilateral mitts removed. Discussed with RN who reports he was pulling at lines last night - discussed use of PRN medication to ensure comfort before mitts used. No pulling or moving extremities at this stage. Prognosis continues to be poor.   Stopped by room multiple times. He continues to appear comfortable at end of life. No visitors present.   Exam: Eyes open but not tracking or making eye contact. Breathing more agonal. Appears comfortable.   Plan: - DNR/DNI - Full comfort care - Anticipate hospital death  25 min  Yong Channel, NP Palliative Medicine Team Pager [phone] (Please see [email or web address] for schedule) Team Phone [phone]

## 2023-11-19 DIAGNOSIS — Z515 Encounter for palliative care: Secondary | ICD-10-CM

## 2023-11-19 LAB — CULTURE, BLOOD (ROUTINE X 2): Special Requests: ADEQUATE

## 2023-11-19 NOTE — Plan of Care (Signed)
  Problem: Pain Management: Goal: Satisfaction with pain management regimen will improve Outcome: Progressing   

## 2023-11-19 NOTE — Progress Notes (Addendum)
This chaplain is attempting a EOL spiritual care visit with the Pt. wife-Edith. Family is not at the bedside at the time of the visit.   This chaplain will phone the Pt. wife after receiving an update from the Pt. RN. This chaplain understands the Pt. RN is off the floor at this time.  **1650 This chaplain is appreciative of RN-Havy's update. This chaplain will revisit the consult on Friday.  This chaplain is available for F/U spiritual care as needed.  Chaplain Stephanie Acre [phone]

## 2023-11-19 NOTE — Hospital Course (Addendum)
PMH of CAD s/p CABG x 2 (LIMA-LAD, SV-PDA) 07/07/13 (EF 60%), DM, Dyslipidemia, LBBB and prior STEMI who was BIB [facility] from home after ex wife called for AMS . Found to have stroke, sepsis and AKI as well as elevated troponin. Initially was transition to comfort care. On 1/25 family wants to transition back out of comfort care measures.  Goal of care is currently to treat what is treatable.  As the patient has significant improvement in his mentation.  Assessment and Plan: Acute ischemic stroke: Initial CT head unremarkable, MRI brain confirmed 6 mm acute ischemic nonhemorrhagic deep white matter infarct involving the left frontal centrum semi ovale.   Seen by neurology.   Initial plan was to resume home aspirin and continue Plavix along with atorvastatin however later on when seen by cardiology, diagnosed with possible NSTEMI, patient started on heparin and antiplatelets were discontinued.   Patient to be seen by PT OT and SLP.   Resume aspirin Plavix for now.   Acute metabolic encephalopathy:  Likely secondary to metabolic derangements, AKI and, on assessment of the stroke. Treat underlying causes.  Severe hypernatremia. From poor p.o. intake. Continue D5.  Missed IV fluid due to lack of IV yesterday. BMP every 6 hours for now.  Will correct slowly.  Severe B12 deficiency. Will replace with subcutaneous injection. Also treat with B complex vitamins.  Presumed Sepsis due to bacterial cause,  Ruled out: Patient presented with elevated wbc but bradycardia along with lactic acidosis. However all of these appear to be secondary to profound dehydration leading to AKI and rhabdomyolysis. Procalcitonin only 0.36, borderline and unreliable in the setting of CKD. Patient was started on Zosyn and vancomycin.  Now stopped.   AKI on CKD stage IIIa secondary to rhabdomyolysis/metabolic acidosis/hyperkalemia/dehydration:  Baseline creatinine 1.4-1.6 just 2 months ago. Presented with  creatinine of 2.86 which has remained stable. Ultrasound renal negative. Nephrology was consulted.  Despite of IV fluids, CK rising. Renal function now appears to be close to baseline.   Non-STEMI. CAD SP CABG. Patient unable to provide history due to aphasia due to stroke. High sensitivity troponin elevated 1018->1138->1286> 2043> 2087 on arrival.   Seen by cardiology, out of concern of NSTEMI, started on heparin drip after clearance from neurology. Echo EF 65 to 70%.  No wall motion abnormality.  RV function is reduced. Back on aspirin Plavix and statin.   Concern for high-grade AV block/complete heart block:  Could be due to electrolyte abnormalities and AKI.  Defer to cardiology.   Severe hypomagnesemia  Replaced  Type 2 diabetes mellitus, without long-term insulin use, well-controlled with renal complication. On glimepiride and metformin at home.  Hemoglobin A1c 7.3. Currently receiving dextrose to correct sodium.  Monitor.   HLD Statin was on hold due to rhabdomyolysis. Now back on aspirin Plavix and statin.   Anemia ,Macrocytic/B12 deficiency Hemoglobin appears stable at baseline.  B12 is undetectable. Continue supplemental B12 injections.  Continue B complex as well.   Goal of care conversation: Patient was admitted as full code. Due to multiple comorbidities as well as active issues patient was transition to complete comfort with consultation with Palliative care  and family. Mentation currently improving and therefore family wants to switch out of comfort care. Maintain DNR. Monitor progression.  Adult failure to thrive. With B12 deficiency, low LDL suspect patient is suffering from failure to thrive. Dietitian consulted. At risk for refeeding syndrome.  Monitor.

## 2023-11-19 NOTE — Progress Notes (Signed)
[facility] Progress Note Patient: Steve Hickman NFA:[id number] DOB: Oct 16, 1941 DOA: 11/16/2023  DOS: the patient was seen and examined on 11/19/2023  Brief Hospital Course: PMH of CAD s/p CABG x 2 (LIMA-LAD, SV-PDA) 07/07/13 (EF 60%), DM, Dyslipidemia, LBBB and prior STEMI who was BIB [facility] from home after ex wife called for AMS . Found to have stroke, sepsis and AKI as well as elevated troponin. Assessment and Plan: Acute ischemic stroke: Initial CT head unremarkable, MRI brain confirmed 6 mm acute ischemic nonhemorrhagic deep white matter infarct involving the left frontal centrum semi ovale.   Seen by neurology.   Initial plan was to resume home aspirin and continue Plavix along with atorvastatin however later on when seen by cardiology, diagnosed with possible NSTEMI, patient started on heparin and antiplatelets were discontinued.   Patient to be seen by PT OT and SLP.   Carries poor prognosis.   Appreciate neurology help   Acute metabolic encephalopathy:  Likely secondary to metabolic derangements, AKI and, on assessment of the stroke. Treat underlying causes.  Presumed Sepsis due to bacterial cause,  Ruled out: Patient presented with elevated wbc but bradycardia along with lactic acidosis. However all of these appear to be secondary to profound dehydration leading to AKI and rhabdomyolysis. Procalcitonin only 0.36, borderline and unreliable in the setting of CKD. Patient was started on Zosyn and vancomycin.     AKI on CKD stage IIIa secondary to rhabdomyolysis/metabolic acidosis/hyperkalemia/dehydration:  Baseline creatinine 1.4-1.6 just 2 months ago. Presented with creatinine of 2.86 which has remained stable. Ultrasound renal negative. Nephrology was consulted.  Despite of IV fluids, CK rising.     Elevated troponin/demand ischemia versus NSTEMI?: History of CAD s/p CABG x 2.  Patient unable to provide history due to aphasia due to stroke. High sensitivity  troponin elevated 1018->1138->1286> 2043> 2087 on arrival.  Seen by cardiology, out of concern of NSTEMI, started on heparin drip after clearance from neurology. Echo EF 65 to 70%.  No wall motion abnormality.  RV function is reduced.   Concern for high-grade AV block/complete heart block:  Could be due to electrolyte abnormalities and AKI.  Defer to cardiology.   Severe hypomagnesemia  Replaced before.   Mild hypernatremia  Resolved with IV hydration.   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  On glimepiride and metformin at home.  Hemoglobin A1c 7.3.   HLD Holding atorvastatin for now due to rhabdomyolysis.  LDL only 20.   Anemia ,Macrocytic/B12 deficiency Hemoglobin appears stable at baseline.  B12 very low.  Will start with supplements and give him 1000 mcg IM x 1 injection.  Will need more supplement while in the hospital and at discharge as well.   Goal of care conversation: Patient was admitted as full code. Due to multiple comorbidities as well as active issues patient was transition to complete comfort with consultation with Perative care and family. Mentation currently improving and therefore will be changing medications from scheduled to as needed. Allow the diet as well. Monitor.   Subjective: No nausea no vomiting no fever no chills.  No chest pain.  Physical Exam: General: in Mild distress, No Rash Cardiovascular: S1 and S2 Present, No Murmur Respiratory: Good respiratory effort, Bilateral Air entry present.  Basal crackles, No wheezes Abdomen: Bowel Sound present, No tenderness Extremities: No edema Neuro: Alert and oriented self, no new focal deficit  Data Reviewed: I have Reviewed nursing notes, Vitals, and Lab results.  Disposition: Status is: Inpatient Remains inpatient appropriate because: Appreciate clarity and  goal of care.   Family Communication: No one at bedside Level of care: Med-Surg   Vitals:   11/17/23 1538 11/18/23 0825 11/18/23 1932 11/19/23 0740   BP: (!) 138/93 131/76 (!) 146/75 (!) 150/92  Pulse: 100 100 (!) 102 (!) 103  Resp: (!) 28 13 18 16   Temp: 99.6 F (37.6 C) 97.7 F (36.5 C) 98.5 F (36.9 C) 99.1 F (37.3 C)  TempSrc: Axillary Oral Oral Oral  SpO2: (!) 89% 92% 95% 96%  Weight:      Height:         Author: Lynden Oxford, MD 11/19/2023 6:54 PM  Please look on [email or web address] to find out who is on call.

## 2023-11-19 NOTE — Plan of Care (Signed)
  Problem: Health Behavior/Discharge Planning: Goal: Ability to manage health-related needs will improve Outcome: Not Progressing   

## 2023-11-19 NOTE — Progress Notes (Signed)
Assisted patient with lunch, pt ate only 5 bites and wished to stop. She verbalized that he is thirsty, and not hungry. Water provided as requests.

## 2023-11-20 DIAGNOSIS — N179 Acute kidney failure, unspecified: Secondary | ICD-10-CM | POA: Diagnosis not present

## 2023-11-20 DIAGNOSIS — I639 Cerebral infarction, unspecified: Secondary | ICD-10-CM | POA: Diagnosis not present

## 2023-11-20 DIAGNOSIS — Z515 Encounter for palliative care: Secondary | ICD-10-CM | POA: Diagnosis not present

## 2023-11-20 DIAGNOSIS — I4819 Other persistent atrial fibrillation: Secondary | ICD-10-CM | POA: Diagnosis not present

## 2023-11-20 NOTE — Progress Notes (Signed)
Palliative Medicine Progress Note   Patient Name: Steve Hickman       Date: 11/20/2023 DOB: [birth year]  Age: 83 y.o. MRN#: [id number] Attending Physician: Rolly Salter, MD Primary Care Physician: Philemon Kingdom, MD Admit Date: 11/16/2023  Reason for Consultation/Follow-up: {Reason for Consult:23484}  HPI/Patient Profile:  83 y.o. 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 Also noted to have possible sepsis, AKI on CKD stage 3a, demand ischemia vs NSTEMI, high-grade AV block/complete heart block.   Subjective: ***  Objective:  Physical Exam          Vital Signs: BP (!) 157/95 (BP Location: Right Arm)   Pulse (!) 115   Temp 97.7 F (36.5 C) (Oral)   Resp 18   Ht 5\' 5"  (1.651 m)   Wt 64.1 kg   SpO2 (!) 89%   BMI 23.52 kg/m  SpO2: SpO2: (!) 89 % O2 Device: O2 Device: Room Air O2 Flow Rate: O2 Flow Rate (L/min): 2 L/min  Intake/output summary:  Intake/Output Summary (Last 24 hours) at 11/20/2023 1433 Last data filed at 11/20/2023 1253 Gross per 24 hour  Intake 438 ml  Output 1150 ml  Net -712 ml    LBM:       Palliative Assessment/Data: ***     Palliative Medicine Assessment & Plan   Assessment: Principal Problem:   Change in mental status Active Problems:   NSTEMI (non-ST elevated myocardial infarction) (HCC)   Cerebrovascular accident (CVA) (HCC)   Demand ischemia (HCC)   Encephalopathy   Persistent atrial fibrillation (HCC)   Acute ischemic stroke (HCC)    Recommendations/Plan: ***  Goals of Care and Additional Recommendations: Limitations on Scope  of Treatment: {Recommended Scope and Preferences:21019}  Code Status:   Prognosis:  {Palliative Care Prognosis:23504}  Discharge Planning: {Palliative dispostion:23505}  Care plan was discussed with ***  Thank you for allowing the Palliative Medicine Team to assist in the care of this patient.   ***   Merry Proud, NP   Please contact Palliative Medicine Team phone at [phone] for questions and concerns.  For individual providers, please see AMION.

## 2023-11-20 NOTE — Progress Notes (Signed)
Plan is for residential hospice vs hospital death. CM has left voice mail for pts spouse to inquire about residential hospice. TOC following.

## 2023-11-20 NOTE — TOC Initial Note (Signed)
Transition of Care [facility]) - Initial/Assessment Note    Patient Details  Name: Steve Hickman MRN: [id number] Date of Birth: 26-Oct-1941  [facility]) CM/SW Contact:    Kermit Balo, RN Phone Number: 11/20/2023, 3:04 PM  Clinical Narrative:                  CM spoke to pts wife over the phone and she is in agreement with CM having [facility] assess him for residential hospice. CM has sent the referral to [facility] with [facility]. Dennard Nip will update if they feel he is a candidate.  TOC following.  Expected Discharge Plan: Hospice Medical Facility Barriers to Discharge: Hospice Bed not available   Patient Goals and CMS Choice   CMS Medicare.gov Compare Post Acute Care list provided to:: Patient Represenative (must comment) Choice offered to / list presented to : Spouse      Expected Discharge Plan and Services   Discharge Planning Services: CM Consult Post Acute Care Choice: Hospice                                        Prior Living Arrangements/Services   Lives with:: Spouse                   Activities of Daily Living      Permission Sought/Granted                  Emotional Assessment              Admission diagnosis:  Transient alteration of awareness [R40.4] Hyperkalemia [E87.5] Acute ischemic stroke (HCC) [I63.9] Change in mental status [R41.82] AKI (acute kidney injury) (HCC) [N17.9] Patient Active Problem List   Diagnosis Date Noted   Change in mental status 11/17/2023   NSTEMI (non-ST elevated myocardial infarction) (HCC) 11/17/2023   Cerebrovascular accident (CVA) (HCC) 11/17/2023   Demand ischemia (HCC) 11/17/2023   Encephalopathy 11/17/2023   Persistent atrial fibrillation (HCC) 11/17/2023   Acute ischemic stroke (HCC) 11/17/2023   Essential hypertension 07/14/2023   Coronary artery disease with hx of myocardial infarct w/o hx of CABG 05/25/2019   Dyslipidemia 05/25/2019   CAD S/P  percutaneous coronary angioplasty 01/26/2017   Paroxysmal a-fib - post-op CABBG 07/11/2013   Diabetes mellitus (HCC) 05/31/2013   Dyslipidemia, goal LDL below 70 05/31/2013   History of ST elevation myocardial infarction (STEMI) 05/29/2013   Hx of CABG 05/29/2013   LBBB (left bundle branch block) 05/29/2013   PCP:  Philemon Kingdom, MD Pharmacy:   [facility] [address] [place] - [address] [facility] [place] [address] Phone: [phone] Fax: [phone]     Social Drivers of Health (SDOH) Social History: SDOH Screenings   Food Insecurity: Low Risk  (11/10/2023)   Received from [facility]  Housing: Low Risk  (11/10/2023)   Received from [facility]  Transportation Needs: No Transportation Needs (11/10/2023)   Received from [facility]  Utilities: Low Risk  (11/10/2023)   Received from [facility]  Social Connections: Unknown (03/11/2022)   Received from [facility]  Tobacco Use: Low Risk  (11/10/2023)   Received from [facility]   SDOH Interventions:     Readmission Risk Interventions     No data to display

## 2023-11-20 NOTE — Progress Notes (Addendum)
This chaplain is present at the Pt. bedside. The   Pt. is  resting comfortably at the time of the visit. The chaplain will proceed with a phone call check in with the Pt. wife-Steve Hickman.  **[phone] This chaplain spoke to the Pt. wife-Steve Hickman on the phone using the number in Epic. Steve Hickman shares she thinks she has a ride to the hospital today. The chaplain listened reflectively as Steve Hickman described her experience as "very hard".   The chaplain updated Steve Hickman on the bedside presence with the Pt. Steve Hickman appreciated the chaplain's update on the Pt. resting comfortably and plans to phone Steve Hickman.   This chaplain is available for F/U spiritual care as needed.  Chaplain Stephanie Acre [phone]

## 2023-11-20 NOTE — Plan of Care (Signed)
Problem: Coping: Goal: Level of anxiety will decrease Outcome: Progressing

## 2023-11-20 NOTE — Progress Notes (Signed)
[facility] PROGRESS NOTE  Patient: Steve Hickman ZOX:[id number]   PCP: Philemon Kingdom, MD DOB: 08-14-1941   DOA: 11/16/2023   DOS: 11/20/2023    Subjective: Patient was likely agitated and received 2 mg of IV Ativan early this morning.  Therefore the time of my evaluation drowsy.  No nausea no vomiting.  No other events overnight.  Objective:  Vitals:   11/19/23 0740 11/19/23 1951 11/20/23 0713 11/20/23 2003  BP: (!) 150/92 (!) 154/100 (!) 157/95 (!) 163/95  Pulse: (!) 103 (!) 111 (!) 115 (!) 102  Resp: 16 18  16   Temp: 99.1 F (37.3 C) (!) 97.5 F (36.4 C) 97.7 F (36.5 C) 98.4 F (36.9 C)  TempSrc: Oral Oral Oral Oral  SpO2: 96% (!) 87% (!) 89% 93%  Weight:      Height:       Clear to auscultation. S1-S2 present. Bowel present. Ate well yesterday.  Assessment and plan: Encephalopathy. Comfort care. Intermittently agitated. Discussed with palliative care. Currently will initiate consultation with residential hospice and monitor. Renal function has progressively worsened with hyperkalemia which continues to render poor prognosis.   Author: Lynden Oxford, MD [facility] 11/20/2023 8:13 PM   If 7PM-7AM, please contact night-coverage at [email or web address]

## 2023-11-21 DIAGNOSIS — I4819 Other persistent atrial fibrillation: Secondary | ICD-10-CM | POA: Diagnosis not present

## 2023-11-21 DIAGNOSIS — I639 Cerebral infarction, unspecified: Secondary | ICD-10-CM | POA: Diagnosis not present

## 2023-11-21 DIAGNOSIS — R4182 Altered mental status, unspecified: Secondary | ICD-10-CM | POA: Diagnosis not present

## 2023-11-21 DIAGNOSIS — N179 Acute kidney failure, unspecified: Secondary | ICD-10-CM | POA: Diagnosis not present

## 2023-11-21 DIAGNOSIS — Z515 Encounter for palliative care: Secondary | ICD-10-CM | POA: Diagnosis not present

## 2023-11-21 LAB — COMPREHENSIVE METABOLIC PANEL
ALT: 51 U/L — ABNORMAL HIGH (ref 0–44)
AST: 40 U/L (ref 15–41)
Albumin: 2.9 g/dL — ABNORMAL LOW (ref 3.5–5.0)
Alkaline Phosphatase: 54 U/L (ref 38–126)
Anion gap: 13 (ref 5–15)
BUN: 30 mg/dL — ABNORMAL HIGH (ref 8–23)
CO2: 22 mmol/L (ref 22–32)
Calcium: 8.9 mg/dL (ref 8.9–10.3)
Chloride: 117 mmol/L — ABNORMAL HIGH (ref 98–111)
Creatinine, Ser: 1.57 mg/dL — ABNORMAL HIGH (ref 0.61–1.24)
GFR, Estimated: 43 mL/min — ABNORMAL LOW (ref >60)
Glucose, Bld: 178 mg/dL — ABNORMAL HIGH (ref 70–99)
Potassium: 3.8 mmol/L (ref 3.5–5.1)
Sodium: 152 mmol/L — ABNORMAL HIGH (ref 135–145)
Total Bilirubin: 1.7 mg/dL — ABNORMAL HIGH (ref 0.0–1.2)
Total Protein: 6.5 g/dL (ref 6.5–8.1)

## 2023-11-21 LAB — BASIC METABOLIC PANEL
Anion gap: 13 (ref 5–15)
BUN: 36 mg/dL — ABNORMAL HIGH (ref 8–23)
CO2: 24 mmol/L (ref 22–32)
Calcium: 9 mg/dL (ref 8.9–10.3)
Chloride: 117 mmol/L — ABNORMAL HIGH (ref 98–111)
Creatinine, Ser: 1.79 mg/dL — ABNORMAL HIGH (ref 0.61–1.24)
GFR, Estimated: 37 mL/min — ABNORMAL LOW (ref >60)
Glucose, Bld: 235 mg/dL — ABNORMAL HIGH (ref 70–99)
Potassium: 3.7 mmol/L (ref 3.5–5.1)
Sodium: 154 mmol/L — ABNORMAL HIGH (ref 135–145)

## 2023-11-21 LAB — CBC WITH DIFFERENTIAL/PLATELET
Abs Immature Granulocytes: 0.06 10*3/uL (ref 0.00–0.07)
Basophils Absolute: 0 10*3/uL (ref 0.0–0.1)
Basophils Relative: 0 %
Eosinophils Absolute: 0.2 10*3/uL (ref 0.0–0.5)
Eosinophils Relative: 2 %
HCT: 37.9 % — ABNORMAL LOW (ref 39.0–52.0)
Hemoglobin: 12 g/dL — ABNORMAL LOW (ref 13.0–17.0)
Immature Granulocytes: 1 %
Lymphocytes Relative: 13 %
Lymphs Abs: 1.4 10*3/uL (ref 0.7–4.0)
MCH: 31.4 pg (ref 26.0–34.0)
MCHC: 31.7 g/dL (ref 30.0–36.0)
MCV: 99.2 fL (ref 80.0–100.0)
Monocytes Absolute: 0.7 10*3/uL (ref 0.1–1.0)
Monocytes Relative: 7 %
Neutro Abs: 8.6 10*3/uL — ABNORMAL HIGH (ref 1.7–7.7)
Neutrophils Relative %: 77 %
Platelets: 170 10*3/uL (ref 150–400)
RBC: 3.82 MIL/uL — ABNORMAL LOW (ref 4.22–5.81)
RDW: 12.6 % (ref 11.5–15.5)
WBC: 11.1 10*3/uL — ABNORMAL HIGH (ref 4.0–10.5)
nRBC: 0 % (ref 0.0–0.2)

## 2023-11-21 LAB — CULTURE, BLOOD (ROUTINE X 2): Culture: NO GROWTH

## 2023-11-21 LAB — MAGNESIUM: Magnesium: 1.9 mg/dL (ref 1.7–2.4)

## 2023-11-21 LAB — CK: Total CK: 343 U/L (ref 49–397)

## 2023-11-21 MED ORDER — ATORVASTATIN CALCIUM 40 MG PO TABS
40.0000 mg | ORAL_TABLET | Freq: Every day | ORAL | Status: DC
  Administered 2023-11-22 – 2023-11-27 (×6): 40 mg via ORAL
  Filled 2023-11-21 (×6): qty 1

## 2023-11-21 MED ORDER — ASPIRIN 81 MG PO TBEC
81.0000 mg | DELAYED_RELEASE_TABLET | Freq: Every day | ORAL | Status: DC
  Administered 2023-11-21 – 2023-11-27 (×7): 81 mg via ORAL
  Filled 2023-11-21 (×7): qty 1

## 2023-11-21 MED ORDER — CHLORHEXIDINE GLUCONATE CLOTH 2 % EX PADS: 6.0000 | MEDICATED_PAD | Freq: Every day | CUTANEOUS | Status: DC

## 2023-11-21 MED ORDER — B COMPLEX-C PO TABS
1.0000 | ORAL_TABLET | Freq: Every day | ORAL | Status: DC
  Administered 2023-11-22 – 2023-11-27 (×6): 1 via ORAL
  Filled 2023-11-21 (×6): qty 1

## 2023-11-21 MED ORDER — CLOPIDOGREL BISULFATE 75 MG PO TABS
75.0000 mg | ORAL_TABLET | Freq: Every day | ORAL | Status: DC
  Administered 2023-11-22 – 2023-11-27 (×6): 75 mg via ORAL
  Filled 2023-11-21 (×7): qty 1

## 2023-11-21 MED ORDER — DEXTROSE 5 % IV SOLN: INTRAVENOUS | Status: DC

## 2023-11-21 MED ORDER — PANTOPRAZOLE SODIUM 40 MG PO TBEC
40.0000 mg | DELAYED_RELEASE_TABLET | Freq: Every day | ORAL | Status: DC
  Administered 2023-11-21 – 2023-11-27 (×7): 40 mg via ORAL
  Filled 2023-11-21 (×7): qty 1

## 2023-11-21 MED ORDER — CYANOCOBALAMIN 1000 MCG/ML IJ SOLN
1000.0000 ug | Freq: Every day | INTRAMUSCULAR | Status: DC
  Administered 2023-11-21 – 2023-11-27 (×6): 1000 ug via SUBCUTANEOUS
  Filled 2023-11-21 (×6): qty 1

## 2023-11-21 MED ORDER — CHLORHEXIDINE GLUCONATE CLOTH 2 % EX PADS
6.0000 | MEDICATED_PAD | Freq: Every day | CUTANEOUS | Status: DC
  Administered 2023-11-22 – 2023-11-23 (×2): 6 via TOPICAL

## 2023-11-21 MED ORDER — ENSURE ENLIVE PO LIQD
237.0000 mL | Freq: Two times a day (BID) | ORAL | Status: DC
  Administered 2023-11-22 – 2023-11-27 (×10): 237 mL via ORAL

## 2023-11-21 MED ORDER — THIAMINE HCL 100 MG/ML IJ SOLN
100.0000 mg | Freq: Once | INTRAMUSCULAR | Status: AC
  Administered 2023-11-21: 100 mg via INTRAVENOUS
  Filled 2023-11-21: qty 2

## 2023-11-21 NOTE — Progress Notes (Signed)
[facility] night cross cover note:   I was notified by RN that the patient has refused to take his evening does of plavix, including after additional education provided by patient's RN.     Steve Pigg, DO Hospitalist

## 2023-11-21 NOTE — Progress Notes (Signed)
Palliative Medicine Progress Note   Patient Name: Steve Hickman       Date: 11/21/2023 DOB: 10-20-1941  Age: 83 y.o. MRN#: [id number] Attending Physician: Rolly Salter, MD Primary Care Physician: Philemon Kingdom, MD Admit Date: 11/16/2023  Reason for Consultation/Follow-up: {Reason for Consult:23484}  HPI/Patient Profile:  83 y.o. 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 Also noted to have possible sepsis, AKI on CKD stage 3a, demand ischemia vs NSTEMI, high-grade AV block/complete heart block.    Palliative Medicine was consulted for goals of care.  After discussion with family, patient was transition to comfort care on 1/21.  Subjective: ***  Objective:  Physical Exam          Vital Signs: BP (!) 156/105 (BP Location: Right Arm)   Pulse (!) 112   Temp (!) 97.5 F (36.4 C) (Oral)   Resp 16   Ht 5\' 5"  (1.651 m)   Wt 64.1 kg   SpO2 96%   BMI 23.52 kg/m  SpO2: SpO2: 96 % O2 Device: O2 Device: Room Air O2 Flow Rate: O2 Flow Rate (L/min): 2 L/min  Intake/output summary:  Intake/Output Summary (Last 24 hours) at 11/21/2023 1242 Last data filed at 11/21/2023 0042 Gross per 24 hour  Intake --  Output 1750 ml  Net -1750 ml    LBM:       Palliative Assessment/Data: ***     Palliative Medicine Assessment & Plan   Assessment: Principal Problem:   Change in mental status Active Problems:   NSTEMI (non-ST elevated myocardial infarction) (HCC)   Cerebrovascular accident (CVA) (HCC)   Demand ischemia (HCC)   Encephalopathy   Persistent atrial fibrillation (HCC)    Acute ischemic stroke (HCC)    Recommendations/Plan: ***  Goals of Care and Additional Recommendations: Limitations on Scope of Treatment: {Recommended Scope and Preferences:21019}  Code Status:   Prognosis:  {Palliative Care Prognosis:23504}  Discharge Planning: {Palliative dispostion:23505}  Care plan was discussed with ***  Thank you for allowing the Palliative Medicine Team to assist in the care of this patient.   ***   Merry Proud, NP   Please contact Palliative  Medicine Team phone at [phone] for questions and concerns.  For individual providers, please see AMION.

## 2023-11-21 NOTE — Progress Notes (Signed)
[facility] Progress Note Patient: Steve Hickman ZOX:[id number] DOB: [birth year] DOA: 11/16/2023  DOS: the patient was seen and examined on 11/21/2023  Brief Hospital Course: PMH of CAD s/p CABG x 2 (LIMA-LAD, SV-PDA) 07/07/13 (EF 60%), DM, Dyslipidemia, LBBB and prior STEMI who was BIB [facility] from home after ex wife called for AMS . Found to have stroke, sepsis and AKI as well as elevated troponin. Initially was transition to comfort care. On 1/25 family wants to transition back out of comfort care measures.  Goal of care is currently to treat what is treatable.  As the patient has significant improvement in his mentation.  Assessment and Plan: Acute ischemic stroke: Initial CT head unremarkable, MRI brain confirmed 6 mm acute ischemic nonhemorrhagic deep white matter infarct involving the left frontal centrum semi ovale.   Seen by neurology.   Initial plan was to resume home aspirin and continue Plavix along with atorvastatin however later on when seen by cardiology, diagnosed with possible NSTEMI, patient started on heparin and antiplatelets were discontinued.   Patient to be seen by PT OT and SLP.   Resume aspirin Plavix for now.   Acute metabolic encephalopathy:  Likely secondary to metabolic derangements, AKI and, on assessment of the stroke. Treat underlying causes.  Severe B12 deficiency. Will replace with subcutaneous injection. Also treated with B complex vitamins.  Presumed Sepsis due to bacterial cause,  Ruled out: Patient presented with elevated wbc but bradycardia along with lactic acidosis. However all of these appear to be secondary to profound dehydration leading to AKI and rhabdomyolysis. Procalcitonin only 0.36, borderline and unreliable in the setting of CKD. Patient was started on Zosyn and vancomycin.  Now stopped.   AKI on CKD stage IIIa secondary to rhabdomyolysis/metabolic acidosis/hyperkalemia/dehydration:  Baseline creatinine 1.4-1.6 just 2  months ago. Presented with creatinine of 2.86 which has remained stable. Ultrasound renal negative. Nephrology was consulted.  Despite of IV fluids, CK rising. Renal function now appears to be close to baseline.  Severe hypernatremia. From poor p.o. intake. Will initiate D5. BMP every 6 hours for now.  Will correct slowly.   Elevated troponin/demand ischemia versus NSTEMI History of CAD s/p CABG x 2.  Patient unable to provide history due to aphasia due to stroke. High sensitivity troponin elevated 1018->1138->1286> 2043> 2087 on arrival.  Seen by cardiology, out of concern of NSTEMI, started on heparin drip after clearance from neurology. Echo EF 65 to 70%.  No wall motion abnormality.  RV function is reduced. Back on aspirin Plavix and statin.   Concern for high-grade AV block/complete heart block:  Could be due to electrolyte abnormalities and AKI.  Defer to cardiology.   Severe hypomagnesemia  Replaced before.   Mild hypernatremia  Resolved with IV hydration.   DMII not insulin dependent.  On glimepiride and metformin at home.  Hemoglobin A1c 7.3.   HLD Statin was on hold due to rhabdomyolysis. Now back on aspirin Plavix and statin.   Anemia ,Macrocytic/B12 deficiency Hemoglobin appears stable at baseline.  B12 very low.  Will start with supplements and give him 1000 mcg IM x 1 injection.  Will need more supplement while in the hospital and at discharge as well.   Goal of care conversation: Patient was admitted as full code. Due to multiple comorbidities as well as active issues patient was transition to complete comfort with consultation with Perative care and family. Mentation currently improving and therefore family wants to switch out of comfort care. Maintain DNR. Monitor progression.  failure to thrive. With B12 deficiency, low LDL suspect patient is suffering from failure to thrive. Dietitian will be consulted. At risk for refeeding syndrome.  Monitor.    Subjective: No nausea no vomiting no fever no chills.  No chest pain.  Physical Exam: Alert and awake.  Oriented to self. Clear to auscultation. Bowel sound present.  Data Reviewed: I have Reviewed nursing notes, Vitals, and Lab results. Discussed with Perative care. Ordered CBC and CMP. Reordered CBC and CMP.  Disposition: Status is: Inpatient Remains inpatient appropriate because: Treating hypernatremia.   Family Communication: No one at bedside discussed with wife on phone Level of care: Med-Surg   Vitals:   11/19/23 1951 11/20/23 0713 11/20/23 2003 11/21/23 1236  BP: (!) 154/100 (!) 157/95 (!) 163/95 (!) 156/105  Pulse: (!) 111 (!) 115 (!) 102 (!) 112  Resp: 18  16 16   Temp: (!) 97.5 F (36.4 C) 97.7 F (36.5 C) 98.4 F (36.9 C) (!) 97.5 F (36.4 C)  TempSrc: Oral Oral Oral Oral  SpO2: (!) 87% (!) 89% 93% 96%  Weight:      Height:         Author: Lynden Oxford, MD 11/21/2023 7:09 PM  Please look on [email or web address] to find out who is on call.

## 2023-11-21 NOTE — Plan of Care (Signed)
Problem: Education: Goal: Knowledge of disease or condition will improve Outcome: Progressing Goal: Knowledge of secondary prevention will improve (MUST DOCUMENT ALL) Outcome: Progressing Goal: Knowledge of patient specific risk factors will improve (DELETE if not current risk factor) Outcome: Progressing

## 2023-11-21 NOTE — Plan of Care (Signed)
  Problem: Education: Goal: Knowledge of disease or condition will improve Outcome: Not Applicable Goal: Knowledge of secondary prevention will improve (MUST DOCUMENT ALL) Outcome: Not Applicable Goal: Knowledge of patient specific risk factors will improve (DELETE if not current risk factor) Outcome: Not Applicable   Problem: Ischemic Stroke/TIA Tissue Perfusion: Goal: Complications of ischemic stroke/TIA will be minimized Outcome: Not Applicable   Problem: Coping: Goal: Will verbalize positive feelings about self Outcome: Not Applicable Goal: Will identify appropriate support needs Outcome: Not Applicable   Problem: Health Behavior/Discharge Planning: Goal: Ability to manage health-related needs will improve Outcome: Not Applicable Goal: Goals will be collaboratively established with patient/family Outcome: Not Applicable   Problem: Self-Care: Goal: Ability to participate in self-care as condition permits will improve Outcome: Not Applicable Goal: Verbalization of feelings and concerns over difficulty with self-care will improve Outcome: Not Applicable Goal: Ability to communicate needs accurately will improve Outcome: Not Applicable   Problem: Nutrition: Goal: Risk of aspiration will decrease Outcome: Not Applicable Goal: Dietary intake will improve Outcome: Not Applicable   Problem: Education: Goal: Knowledge of the prescribed therapeutic regimen will improve Outcome: Not Applicable   Problem: Coping: Goal: Ability to identify and develop effective coping behavior will improve Outcome: Not Applicable   Problem: Clinical Measurements: Goal: Quality of life will improve Outcome: Not Applicable   Problem: Respiratory: Goal: Verbalizations of increased ease of respirations will increase Outcome: Not Applicable   Problem: Role Relationship: Goal: Family's ability to cope with current situation will improve Outcome: Not Applicable Goal: Ability to verbalize  concerns, feelings, and thoughts to partner or family member will improve Outcome: Not Applicable   Problem: Pain Management: Goal: Satisfaction with pain management regimen will improve Outcome: Not Applicable   Problem: Education: Goal: Knowledge of General Education information will improve Description: Including pain rating scale, medication(s)/side effects and non-pharmacologic comfort measures Outcome: Not Applicable   Problem: Health Behavior/Discharge Planning: Goal: Ability to manage health-related needs will improve Outcome: Not Applicable   Problem: Clinical Measurements: Goal: Ability to maintain clinical measurements within normal limits will improve Outcome: Not Applicable Goal: Will remain free from infection Outcome: Not Applicable Goal: Diagnostic test results will improve Outcome: Not Applicable Goal: Respiratory complications will improve Outcome: Not Applicable Goal: Cardiovascular complication will be avoided Outcome: Not Applicable   Problem: Activity: Goal: Risk for activity intolerance will decrease Outcome: Not Applicable   Problem: Nutrition: Goal: Adequate nutrition will be maintained Outcome: Not Applicable   Problem: Coping: Goal: Level of anxiety will decrease Outcome: Not Applicable   Problem: Elimination: Goal: Will not experience complications related to bowel motility Outcome: Not Applicable Goal: Will not experience complications related to urinary retention Outcome: Not Applicable   Problem: Pain Managment: Goal: General experience of comfort will improve and/or be controlled Outcome: Not Applicable   Problem: Safety: Goal: Ability to remain free from injury will improve Outcome: Not Applicable   Problem: Skin Integrity: Goal: Risk for impaired skin integrity will decrease Outcome: Not Applicable

## 2023-11-22 DIAGNOSIS — R451 Restlessness and agitation: Secondary | ICD-10-CM | POA: Diagnosis not present

## 2023-11-22 DIAGNOSIS — I214 Non-ST elevation (NSTEMI) myocardial infarction: Secondary | ICD-10-CM | POA: Diagnosis not present

## 2023-11-22 DIAGNOSIS — R4182 Altered mental status, unspecified: Secondary | ICD-10-CM | POA: Diagnosis not present

## 2023-11-22 DIAGNOSIS — I4819 Other persistent atrial fibrillation: Secondary | ICD-10-CM | POA: Diagnosis not present

## 2023-11-22 DIAGNOSIS — I639 Cerebral infarction, unspecified: Secondary | ICD-10-CM | POA: Diagnosis not present

## 2023-11-22 LAB — BASIC METABOLIC PANEL
Anion gap: 15 (ref 5–15)
Anion gap: 12 (ref 5–15)
Anion gap: 11 (ref 5–15)
Anion gap: 14 (ref 5–15)
BUN: 31 mg/dL — ABNORMAL HIGH (ref 8–23)
BUN: 30 mg/dL — ABNORMAL HIGH (ref 8–23)
BUN: 31 mg/dL — ABNORMAL HIGH (ref 8–23)
BUN: 31 mg/dL — ABNORMAL HIGH (ref 8–23)
CO2: 19 mmol/L — ABNORMAL LOW (ref 22–32)
CO2: 23 mmol/L (ref 22–32)
CO2: 22 mmol/L (ref 22–32)
CO2: 20 mmol/L — ABNORMAL LOW (ref 22–32)
Calcium: 8.7 mg/dL — ABNORMAL LOW (ref 8.9–10.3)
Calcium: 8.7 mg/dL — ABNORMAL LOW (ref 8.9–10.3)
Calcium: 8.5 mg/dL — ABNORMAL LOW (ref 8.9–10.3)
Calcium: 8.4 mg/dL — ABNORMAL LOW (ref 8.9–10.3)
Chloride: 120 mmol/L — ABNORMAL HIGH (ref 98–111)
Chloride: 115 mmol/L — ABNORMAL HIGH (ref 98–111)
Chloride: 115 mmol/L — ABNORMAL HIGH (ref 98–111)
Chloride: 115 mmol/L — ABNORMAL HIGH (ref 98–111)
Creatinine, Ser: 1.78 mg/dL — ABNORMAL HIGH (ref 0.61–1.24)
Creatinine, Ser: 1.49 mg/dL — ABNORMAL HIGH (ref 0.61–1.24)
Creatinine, Ser: 1.57 mg/dL — ABNORMAL HIGH (ref 0.61–1.24)
Creatinine, Ser: 1.44 mg/dL — ABNORMAL HIGH (ref 0.61–1.24)
GFR, Estimated: 37 mL/min — ABNORMAL LOW (ref >60)
GFR, Estimated: 46 mL/min — ABNORMAL LOW (ref >60)
GFR, Estimated: 43 mL/min — ABNORMAL LOW (ref >60)
GFR, Estimated: 48 mL/min — ABNORMAL LOW (ref >60)
Glucose, Bld: 152 mg/dL — ABNORMAL HIGH (ref 70–99)
Glucose, Bld: 164 mg/dL — ABNORMAL HIGH (ref 70–99)
Glucose, Bld: 264 mg/dL — ABNORMAL HIGH (ref 70–99)
Glucose, Bld: 189 mg/dL — ABNORMAL HIGH (ref 70–99)
Potassium: 3.6 mmol/L (ref 3.5–5.1)
Potassium: 3.6 mmol/L (ref 3.5–5.1)
Potassium: 3.2 mmol/L — ABNORMAL LOW (ref 3.5–5.1)
Potassium: 3.3 mmol/L — ABNORMAL LOW (ref 3.5–5.1)
Sodium: 154 mmol/L — ABNORMAL HIGH (ref 135–145)
Sodium: 150 mmol/L — ABNORMAL HIGH (ref 135–145)
Sodium: 148 mmol/L — ABNORMAL HIGH (ref 135–145)
Sodium: 149 mmol/L — ABNORMAL HIGH (ref 135–145)

## 2023-11-22 LAB — CBC
HCT: 38.9 % — ABNORMAL LOW (ref 39.0–52.0)
Hemoglobin: 12.6 g/dL — ABNORMAL LOW (ref 13.0–17.0)
MCH: 31.5 pg (ref 26.0–34.0)
MCHC: 32.4 g/dL (ref 30.0–36.0)
MCV: 97.3 fL (ref 80.0–100.0)
Platelets: 166 10*3/uL (ref 150–400)
RBC: 4 MIL/uL — ABNORMAL LOW (ref 4.22–5.81)
RDW: 12.6 % (ref 11.5–15.5)
WBC: 13.6 10*3/uL — ABNORMAL HIGH (ref 4.0–10.5)
nRBC: 0 % (ref 0.0–0.2)

## 2023-11-22 LAB — MAGNESIUM: Magnesium: 1.9 mg/dL (ref 1.7–2.4)

## 2023-11-22 LAB — PHOSPHORUS: Phosphorus: 2.8 mg/dL (ref 2.5–4.6)

## 2023-11-22 MED ORDER — DEXTROSE 5 % IV SOLN: INTRAVENOUS | Status: DC

## 2023-11-22 MED ORDER — HALOPERIDOL LACTATE 5 MG/ML IJ SOLN: 1.0000 mg | Freq: Four times a day (QID) | INTRAMUSCULAR | Status: DC | PRN

## 2023-11-22 MED ORDER — POLYETHYLENE GLYCOL 3350 17 G PO PACK: 17.0000 g | PACK | Freq: Every day | ORAL | Status: DC | PRN

## 2023-11-22 MED ORDER — DOCUSATE SODIUM 50 MG PO CAPS
50.0000 mg | ORAL_CAPSULE | Freq: Two times a day (BID) | ORAL | Status: DC
  Administered 2023-11-22: 50 mg via ORAL
  Filled 2023-11-22 (×2): qty 1

## 2023-11-22 MED ORDER — OLANZAPINE 5 MG PO TBDP
2.5000 mg | ORAL_TABLET | Freq: Every day | ORAL | Status: DC
  Administered 2023-11-22 – 2023-11-26 (×5): 2.5 mg via ORAL
  Filled 2023-11-22 (×6): qty 0.5

## 2023-11-22 NOTE — Evaluation (Signed)
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/07/13 (EF 60%)  05/29/2013   Diabetes mellitus (HCC) 05/31/2013   Dyslipidemia, goal LDL below 70 05/31/2013   Left bundle branch block    STEMI (ST elevation myocardial infarction)- CFX BMS, 05/28/2013 05/29/2013   STEMI    Past Surgical History:  Past Surgical History:  Procedure Laterality Date   CORONARY ARTERY BYPASS GRAFT N/A 07/07/2013   Procedure: CORONARY ARTERY BYPASS GRAFTING (CABG);  Surgeon: Loreli Slot, MD;  [facility]: [facility] OR;  Service: Open Heart Surgery;  Laterality: N/A;  CABG X 2   LEFT HEART CATH N/A 05/28/2013   Procedure: LEFT HEART CATH;  Surgeon: Runell Gess, MD;  Location: [facility] CATH LAB;  Service: Cardiovascular;  Laterality: N/A;   NO PAST SURGERIES     PERCUTANEOUS CORONARY STENT INTERVENTION (PCI-S)  05/28/2013   Procedure: PERCUTANEOUS CORONARY STENT INTERVENTION (PCI-S);  Surgeon: Runell Gess, MD;  Location: [facility] CATH LAB;  Service: Cardiovascular;;  prox cx   HPI:  Pt admitted by [facility] from home after ex wife called for AMS . Found to have stroke, sepsis and AKI as well as elevated troponin. Initially was transitioned to comfort care. 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 On 1/25 family wants to transition back out of comfort care measures.  Goal of care is currently to treat what is treatable.  As the patient has significant improvement in his mentation. PMH of CAD s/p CABG x 2 (LIMA-LAD, SV-PDA) 07/07/13 (EF 60%), DM, Dyslipidemia, LBBB and prior STEMI    Assessment / Plan / Recommendation  Clinical Impression  Pt  demonstrates impaired ability to masticate solids. Pt has no focal weakness observed on oral motor exam and tolerates liquids well. Was able to drink several oz of fluids without difficulty. However, when given soft butter cookie pt had prolonged attempt to masticate with no bolus formation, just difuse crumbs throught his mouth while he paused to rest as if the work of masticating was too effortful. Pt eventually began to cough and gag and SLP had to assist in clearing the particles from his mouth. He was already on a dys 2 diet, but did not attempt any of the solids on the tray. For now, puree textures will be easy and low risk. Pt may upgrade as endurance improves. SLP Visit Diagnosis: Dysphagia, oral phase (R13.11)    Aspiration Risk  Moderate aspiration risk    Diet Recommendation Dysphagia 1 (Puree);Thin liquid    Liquid Administration via: Cup;Straw Medication Administration: Whole meds with puree Supervision: Staff to assist with self feeding Compensations: Slow rate;Small sips/bites;Minimize environmental distractions Postural Changes: Seated upright at 90 degrees    Other  Recommendations Oral Care Recommendations: Oral care BID    Recommendations for follow up therapy are one component of a multi-disciplinary discharge planning process, led by the attending physician.  Recommendations may be updated based on patient status, additional functional criteria and insurance authorization.  Follow up Recommendations Skilled nursing-short term rehab (<3 hours/day)      Assistance Recommended at Discharge    Functional Status Assessment Patient has had a recent decline in their  functional status and demonstrates the ability to make significant improvements in function in a reasonable and predictable amount of time.  Frequency and Duration min 2x/week  2 weeks       Prognosis Prognosis for improved oropharyngeal function: Fair      Swallow Study   General HPI: Pt admitted by  [facility] from home after ex wife called for AMS . Found to have stroke, sepsis and AKI as well as elevated troponin. Initially was transitioned to comfort care. 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 On 1/25 family wants to transition back out of comfort care measures.  Goal of care is currently to treat what is treatable.  As the patient has significant improvement in his mentation. PMH of CAD s/p CABG x 2 (LIMA-LAD, SV-PDA) 07/07/13 (EF 60%), DM, Dyslipidemia, LBBB and prior STEMI Type of Study: Bedside Swallow Evaluation Previous Swallow Assessment: none Diet Prior to this Study: Dysphagia 2 (finely chopped);Thin liquids (Level 0) Temperature Spikes Noted: No Respiratory Status: Room air History of Recent Intubation: No Behavior/Cognition: Alert;Cooperative;Pleasant mood Oral Cavity Assessment: Dry Oral Care Completed by SLP: No Oral Cavity - Dentition: Adequate natural dentition Vision: Functional for self-feeding Self-Feeding Abilities: Able to feed self Patient Positioning: Upright in bed Baseline Vocal Quality: Normal Volitional Cough: Strong Volitional Swallow: Able to elicit    Oral/Motor/Sensory Function Overall Oral Motor/Sensory Function: Within functional limits   Ice Chips     Thin Liquid Thin Liquid: Within functional limits Presentation: Straw;Self Fed    Nectar Thick Nectar Thick Liquid: Not tested   Honey Thick Honey Thick Liquid: Not tested   Puree Puree: Within functional limits   Solid     Solid: Impaired      Tomer Chalmers, Riley Nearing 11/22/2023,2:53 PM

## 2023-11-22 NOTE — Progress Notes (Signed)
TR[facility] night cross cover note:   I was notified by RN that this patient is no longer prn is eating well, but has not had any recently documented bowel movement.  I subsequently ordered scheduled Colace as well as as needed MiraLAX for constipation.    Steve Pigg, DO Hospitalist

## 2023-11-22 NOTE — Progress Notes (Addendum)
Palliative Medicine Progress Note   Patient Name: Steve Hickman       Date: 11/22/2023 DOB: September 27, 1941  Age: 83 y.o. MRN#: [id number] Attending Physician: Rolly Salter, MD Primary Care Physician: Philemon Kingdom, MD Admit Date: 11/16/2023   HPI/Patient Profile:  83 y.o. 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 Also noted to have possible sepsis, AKI on CKD stage 3a, demand ischemia vs NSTEMI, high-grade AV block/complete heart block.    Palliative Medicine was consulted for goals of care.  After discussion with family, patient was transitioned to comfort care on 1/21. Patient's mental status later improved, and comfort care was reversed on 1/25.   Subjective: Chart reviewed and update received from RN. Patient was very agitated overnight, and today has pulled out his IV despite having bilateral mittens on.  Yesterday he required IV placement by IV team using ultrasound.  Discussed with Dr. Allena Katz starting zyprexa at bedtime to help with agitation.  EKG today showed corrected QT less than 500 based on calculation for his wide QRS.  Patient seen at bedside. He is currently sleeping and appears comfortable; I did not attempt to wake him.     Objective:  Physical Exam Vitals reviewed.  Constitutional:      General: He is sleeping. He is not in acute distress.    Appearance: He is ill-appearing.  Pulmonary:     Effort: Pulmonary effort is normal.  Skin:    General: Skin is warm and dry.  Neurological:     Motor: Weakness present.              Palliative Medicine Assessment &  Plan   Assessment: Principal Problem:   Change in mental status Active Problems:   NSTEMI (non-ST elevated myocardial infarction) (HCC)   Cerebrovascular accident (CVA) (HCC)   Demand ischemia (HCC)   Encephalopathy   Persistent atrial fibrillation (HCC)   Acute ischemic stroke (HCC)    Recommendations/Plan: Continue current scope of care - treat the treatable Goal of care is to allow time and opportunity for potential improvement Start Zyprexa 2.5 mg at bedtime for agitation, titrate up as needed if QTc  is less than 500 PT/OT to evaluate patient PMT will continue to follow   Code Status: DNR/DNI   Prognosis:  guarded  Discharge Planning: To Be Determined   Thank you for allowing the Palliative Medicine Team to assist in the care of this patient.   Time: 28 minutes   Merry Proud, NP   Please contact Palliative Medicine Team phone at [phone] for questions and concerns.  For individual providers, please see AMION.

## 2023-11-22 NOTE — Progress Notes (Signed)
[facility] Progress Note Patient: Steve Hickman MWU:[id number] DOB: Nov 08, 1940 DOA: 11/16/2023  DOS: the patient was seen and examined on 11/22/2023 Brief Hospital Course: PMH of CAD s/p CABG x 2 (LIMA-LAD, SV-PDA) 07/07/13 (EF 60%), DM, Dyslipidemia, LBBB and prior STEMI who was BIB [facility] from home after ex wife called for AMS . Found to have stroke, sepsis and AKI as well as elevated troponin. Initially was transition to comfort care. On 1/25 family wants to transition back out of comfort care measures.  Goal of care is currently to treat what is treatable.  As the patient has significant improvement in his mentation.  Assessment and Plan: Acute ischemic stroke: Initial CT head unremarkable, MRI brain confirmed 6 mm acute ischemic nonhemorrhagic deep white matter infarct involving the left frontal centrum semi ovale.   Seen by neurology.   Initial plan was to resume home aspirin and continue Plavix along with atorvastatin however later on when seen by cardiology, diagnosed with possible NSTEMI, patient started on heparin and antiplatelets were discontinued.   Patient to be seen by PT OT and SLP.   Resume aspirin Plavix for now.   Acute metabolic encephalopathy:  Likely secondary to metabolic derangements, AKI and, on assessment of the stroke. Treat underlying causes.  Severe hypernatremia. From poor p.o. intake. Continue D5.  Missed IV fluid due to lack of IV yesterday. BMP every 6 hours for now.  Will correct slowly.  Severe B12 deficiency. Will replace with subcutaneous injection. Also treat with B complex vitamins.  Presumed Sepsis due to bacterial cause,  Ruled out: Patient presented with elevated wbc but bradycardia along with lactic acidosis. However all of these appear to be secondary to profound dehydration leading to AKI and rhabdomyolysis. Procalcitonin only 0.36, borderline and unreliable in the setting of CKD. Patient was started on Zosyn and vancomycin.   Now stopped.   AKI on CKD stage IIIa secondary to rhabdomyolysis/metabolic acidosis/hyperkalemia/dehydration:  Baseline creatinine 1.4-1.6 just 2 months ago. Presented with creatinine of 2.86 which has remained stable. Ultrasound renal negative. Nephrology was consulted.  Despite of IV fluids, CK rising. Renal function now appears to be close to baseline.   Non-STEMI. CAD SP CABG. Patient unable to provide history due to aphasia due to stroke. High sensitivity troponin elevated 1018->1138->1286> 2043> 2087 on arrival.   Seen by cardiology, out of concern of NSTEMI, started on heparin drip after clearance from neurology. Echo EF 65 to 70%.  No wall motion abnormality.  RV function is reduced. Back on aspirin Plavix and statin.   Concern for high-grade AV block/complete heart block:  Could be due to electrolyte abnormalities and AKI.  Defer to cardiology.   Severe hypomagnesemia  Replaced  Type 2 diabetes mellitus, without long-term insulin use, well-controlled with renal complication. On glimepiride and metformin at home.  Hemoglobin A1c 7.3. Currently receiving dextrose to correct sodium.  Monitor.   HLD Statin was on hold due to rhabdomyolysis. Now back on aspirin Plavix and statin.   Anemia ,Macrocytic/B12 deficiency Hemoglobin appears stable at baseline.  B12 is undetectable. Continue supplemental B12 injections.  Continue B complex as well.   Goal of care conversation: Patient was admitted as full code. Due to multiple comorbidities as well as active issues patient was transition to complete comfort with consultation with Palliative care  and family. Mentation currently improving and therefore family wants to switch out of comfort care. Maintain DNR. Monitor progression.  Adult failure to thrive. With B12 deficiency, low LDL suspect patient is  suffering from failure to thrive. Dietitian consulted. At risk for refeeding syndrome.  Monitor.   Agitation. Started on  Zyprexa.  Monitor.  Subjective: Somewhat agitated this morning.  No nausea or vomiting no fever no chills.  Corrected QT based on the calculation for his wide QRS is less than 500.  Physical Exam: General: in Mild distress, No Rash Cardiovascular: S1 and S2 Present, No Murmur Respiratory: Good respiratory effort, Bilateral Air entry present. No Crackles, No wheezes Abdomen: Bowel Sound present, No tenderness Extremities: No edema Neuro: Alert and oriented x3, no new focal deficit  Data Reviewed: I have Reviewed nursing notes, Vitals, and Lab results. Since last encounter, pertinent lab results CBC and BMP   . I have ordered test including CBC and BMP  . I have discussed pt's care plan and test results with palliative care  .   Disposition: Status is: Inpatient Remains inpatient appropriate because: Need for therapy to correct sodium  Family Communication: Discussed with wife on phone on 1/25. Level of care: Med-Surg   Vitals:   11/20/23 2003 11/21/23 1236 11/21/23 1941 11/22/23 0845  BP: (!) 163/95 (!) 156/105 (!) 145/98 (!) 159/107  Pulse: (!) 102 (!) 112 (!) 110 (!) 102  Resp: 16 16 14 19   Temp: 98.4 F (36.9 C) (!) 97.5 F (36.4 C) 97.8 F (36.6 C) 98.4 F (36.9 C)  TempSrc: Oral Oral Oral Oral  SpO2: 93% 96% 93% 94%  Weight:      Height:         Author: Lynden Oxford, MD 11/22/2023 12:36 PM  Please look on [email or web address] to find out who is on call.

## 2023-11-23 DIAGNOSIS — Z515 Encounter for palliative care: Secondary | ICD-10-CM | POA: Diagnosis not present

## 2023-11-23 DIAGNOSIS — I639 Cerebral infarction, unspecified: Secondary | ICD-10-CM | POA: Diagnosis not present

## 2023-11-23 DIAGNOSIS — Z7189 Other specified counseling: Secondary | ICD-10-CM | POA: Diagnosis not present

## 2023-11-23 DIAGNOSIS — R4182 Altered mental status, unspecified: Secondary | ICD-10-CM | POA: Diagnosis not present

## 2023-11-23 LAB — BASIC METABOLIC PANEL
Anion gap: 9 (ref 5–15)
Anion gap: 13 (ref 5–15)
Anion gap: 15 (ref 5–15)
Anion gap: 10 (ref 5–15)
BUN: 30 mg/dL — ABNORMAL HIGH (ref 8–23)
BUN: 26 mg/dL — ABNORMAL HIGH (ref 8–23)
BUN: 29 mg/dL — ABNORMAL HIGH (ref 8–23)
BUN: 37 mg/dL — ABNORMAL HIGH (ref 8–23)
CO2: 24 mmol/L (ref 22–32)
CO2: 23 mmol/L (ref 22–32)
CO2: 21 mmol/L — ABNORMAL LOW (ref 22–32)
CO2: 22 mmol/L (ref 22–32)
Calcium: 8.2 mg/dL — ABNORMAL LOW (ref 8.9–10.3)
Calcium: 8.6 mg/dL — ABNORMAL LOW (ref 8.9–10.3)
Calcium: 8.7 mg/dL — ABNORMAL LOW (ref 8.9–10.3)
Calcium: 8.4 mg/dL — ABNORMAL LOW (ref 8.9–10.3)
Chloride: 112 mmol/L — ABNORMAL HIGH (ref 98–111)
Chloride: 111 mmol/L (ref 98–111)
Chloride: 109 mmol/L (ref 98–111)
Chloride: 113 mmol/L — ABNORMAL HIGH (ref 98–111)
Creatinine, Ser: 1.42 mg/dL — ABNORMAL HIGH (ref 0.61–1.24)
Creatinine, Ser: 1.4 mg/dL — ABNORMAL HIGH (ref 0.61–1.24)
Creatinine, Ser: 1.7 mg/dL — ABNORMAL HIGH (ref 0.61–1.24)
Creatinine, Ser: 1.99 mg/dL — ABNORMAL HIGH (ref 0.61–1.24)
GFR, Estimated: 49 mL/min — ABNORMAL LOW (ref >60)
GFR, Estimated: 50 mL/min — ABNORMAL LOW (ref >60)
GFR, Estimated: 39 mL/min — ABNORMAL LOW (ref >60)
GFR, Estimated: 32 mL/min — ABNORMAL LOW (ref >60)
Glucose, Bld: 181 mg/dL — ABNORMAL HIGH (ref 70–99)
Glucose, Bld: 167 mg/dL — ABNORMAL HIGH (ref 70–99)
Glucose, Bld: 227 mg/dL — ABNORMAL HIGH (ref 70–99)
Glucose, Bld: 238 mg/dL — ABNORMAL HIGH (ref 70–99)
Potassium: 3 mmol/L — ABNORMAL LOW (ref 3.5–5.1)
Potassium: 2.9 mmol/L — ABNORMAL LOW (ref 3.5–5.1)
Potassium: 3.2 mmol/L — ABNORMAL LOW (ref 3.5–5.1)
Potassium: 3.6 mmol/L (ref 3.5–5.1)
Sodium: 145 mmol/L (ref 135–145)
Sodium: 147 mmol/L — ABNORMAL HIGH (ref 135–145)
Sodium: 145 mmol/L (ref 135–145)
Sodium: 145 mmol/L (ref 135–145)

## 2023-11-23 LAB — CBC
HCT: 33.1 % — ABNORMAL LOW (ref 39.0–52.0)
Hemoglobin: 10.7 g/dL — ABNORMAL LOW (ref 13.0–17.0)
MCH: 31.4 pg (ref 26.0–34.0)
MCHC: 32.3 g/dL (ref 30.0–36.0)
MCV: 97.1 fL (ref 80.0–100.0)
Platelets: 169 10*3/uL (ref 150–400)
RBC: 3.41 MIL/uL — ABNORMAL LOW (ref 4.22–5.81)
RDW: 12.6 % (ref 11.5–15.5)
WBC: 12.9 10*3/uL — ABNORMAL HIGH (ref 4.0–10.5)
nRBC: 0 % (ref 0.0–0.2)

## 2023-11-23 LAB — MAGNESIUM: Magnesium: 1.7 mg/dL (ref 1.7–2.4)

## 2023-11-23 LAB — PHOSPHORUS: Phosphorus: 2.9 mg/dL (ref 2.5–4.6)

## 2023-11-23 MED ORDER — TAMSULOSIN HCL 0.4 MG PO CAPS
0.4000 mg | ORAL_CAPSULE | Freq: Every day | ORAL | Status: DC
  Administered 2023-11-23 – 2023-11-27 (×5): 0.4 mg via ORAL
  Filled 2023-11-23 (×5): qty 1

## 2023-11-23 MED ORDER — DOCUSATE SODIUM 100 MG PO CAPS
100.0000 mg | ORAL_CAPSULE | Freq: Two times a day (BID) | ORAL | Status: DC
Start: 2023-11-23 — End: 2023-11-26
  Administered 2023-11-23 – 2023-11-26 (×7): 100 mg via ORAL
  Filled 2023-11-23 (×7): qty 1

## 2023-11-23 MED ORDER — HEPARIN SODIUM (PORCINE) 5000 UNIT/ML IJ SOLN
5000.0000 [IU] | Freq: Three times a day (TID) | INTRAMUSCULAR | Status: DC
  Administered 2023-11-23 – 2023-11-27 (×13): 5000 [IU] via SUBCUTANEOUS
  Filled 2023-11-23 (×13): qty 1

## 2023-11-23 MED ORDER — POTASSIUM CHLORIDE CRYS ER 20 MEQ PO TBCR
40.0000 meq | EXTENDED_RELEASE_TABLET | Freq: Once | ORAL | Status: AC
  Administered 2023-11-23: 40 meq via ORAL
  Filled 2023-11-23: qty 2

## 2023-11-23 MED ORDER — DEXTROSE IN LACTATED RINGERS 5 % IV SOLN: INTRAVENOUS | Status: DC

## 2023-11-23 MED ORDER — DOCUSATE SODIUM 100 MG PO CAPS: 100.0000 mg | ORAL_CAPSULE | Freq: Every day | ORAL | Status: DC

## 2023-11-23 NOTE — Evaluation (Signed)
Physical Therapy Evaluation Patient Details Name: Steve Hickman MRN: [id number] DOB: 1940/11/19 Today's Date: 11/23/2023  History of Present Illness  Pt is 83 yo male who presents on 11/16/23 with AMS, found down, garbled speech, unable to use R side. 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  Possible NSTEMI as well. PMH: CAD s/p CABG, DM, LBBB, STEMI  Clinical Impression   Pt admitted secondary to problem above with deficits below. PTA patient was living alone and indedpendent with his mobility. Pt currently requires up to mod assist with ambulation due to knees buckling and likely orthostasis (once seated, bP 73/50 with recovery to 108/69 reclined with legs elevated). Anticipate patient will benefit from PT to address problems listed below.Will continue to follow acutely to maximize functional mobility independence and safety.  Recommend continued inpatient post-acute therapies with >3 hours of therapy per day.          If plan is discharge home, recommend the following: A little help with walking and/or transfers;A little help with bathing/dressing/bathroom;Assistance with cooking/housework;Direct supervision/assist for medications management;Direct supervision/assist for financial management;Assist for transportation;Help with stairs or ramp for entrance;Supervision due to cognitive status   Can travel by private vehicle        Equipment Recommendations Rollator (4 wheels)  Recommendations for Other Services  Rehab consult    Functional Status Assessment Patient has had a recent decline in their functional status and demonstrates the ability to make significant improvements in function in a reasonable and predictable amount of time.     Precautions / Restrictions Precautions Precautions: Fall Precaution Comments: watch BP Restrictions Weight Bearing Restrictions Per Provider Order: No      Mobility  Bed Mobility                     Transfers                   General transfer comment: denied initial dizziness    Ambulation/Gait Ambulation/Gait assistance: Min assist, Mod assist Gait Distance (Feet): 35 Feet Assistive device: Rolling walker (2 wheels) Gait Pattern/deviations: Step-through pattern, Decreased stride length, Shuffle, Trunk flexed   Gait velocity interpretation: <1.8 ft/sec, indicate of risk for recurrent falls   General Gait Details: pt min assist until final 3 feet of ambulation when knees began to give out with PT catching pt and assisting into chair. BP 73/50  Stairs            Wheelchair Mobility     Tilt Bed    Modified Rankin (Stroke Patients Only) Modified Rankin (Stroke Patients Only) Pre-Morbid Rankin Score: No symptoms Modified Rankin: Moderately severe disability     Balance                                             Pertinent Vitals/Pain      Home Living Family/patient expects to be discharged to:: Private residence Living Arrangements: Alone Available Help at Discharge: Other (Comment);Available PRN/intermittently (ex-wife) Type of Home: Apartment Home Access: Stairs to enter   Entrance Stairs-Number of Steps: 3-4   Home Layout: One level Home Equipment: None      Prior Function Prior Level of Function : Independent/Modified Independent             Mobility Comments: indep, no AD. Reports 2 falls outside of the home several  months ago ADLs Comments: indep ADLs, IADLs     Extremity/Trunk Assessment   Upper Extremity Assessment Upper Extremity Assessment: Defer to OT evaluation    Lower Extremity Assessment Lower Extremity Assessment: Generalized weakness    Cervical / Trunk Assessment Cervical / Trunk Assessment: Normal  Communication   Communication Communication: Hearing impairment  Cognition                                                General Comments General comments (skin  integrity, edema, etc.): After ambulation BP 73/50; sat down and reclined with BP 108/69 and pt denying dizziness    Exercises     Assessment/Plan    PT Assessment Patient needs continued PT services  PT Problem List Decreased strength;Decreased activity tolerance;Decreased balance;Decreased mobility;Decreased knowledge of use of DME;Cardiopulmonary status limiting activity       PT Treatment Interventions DME instruction;Gait training;Functional mobility training;Therapeutic activities;Therapeutic exercise;Balance training;Neuromuscular re-education;Patient/family education    PT Goals (Current goals can be found in the Care Plan section)  Acute Rehab PT Goals Patient Stated Goal: get stronger and go home PT Goal Formulation: With patient Time For Goal Achievement: 12/07/23 Potential to Achieve Goals: Good    Frequency Min 1X/week     Co-evaluation               AM-PAC PT "6 Clicks" Mobility  Outcome Measure                  End of Session Equipment Utilized During Treatment: Gait belt Activity Tolerance: Treatment limited secondary to medical complications (Comment) (low bP) Patient left: in chair;with call bell/phone within reach;with chair alarm set Nurse Communication: Mobility status;Other (comment) (orthostatic) PT Visit Diagnosis: Unsteadiness on feet (R26.81);Muscle weakness (generalized) (M62.81)    Time: 9629-5284 PT Time Calculation (min) (ACUTE ONLY): 20 min   Charges:   PT Evaluation $PT Eval Low Complexity: 1 Low   PT General Charges $$ ACUTE PT VISIT: 1 Visit          Jerolyn Center, PT Acute Rehabilitation Services  Office [phone]   Zena Amos 11/23/2023, 2:45 PM

## 2023-11-23 NOTE — Progress Notes (Signed)
TR[facility] night cross cover note:   I was notified by RN at the patient's evening BMP reflects interval increase in sodium level, now up to 145 interval increase in serum creatinine, now up to 1.99.  RN conveys that the patient was previously being treated with D5 running at 40 cc/h, but is no longer on any IV fluids.  She also conveys that the patient has very limited oral intake, and that after the patient's Foley catheter was removed around 1445 today, that the patient has not voided, has no urge to urinate, and has bladder scan that shows 0 cc.  In the setting of concern for dehydration with increasing sodium, worsening renal function and no urine output, will start D5 LR running at 75 cc/h x 12 hours.   Steve Pigg, DO Hospitalist

## 2023-11-23 NOTE — Plan of Care (Signed)
  Problem: Education: Goal: Knowledge of disease or condition will improve Outcome: Not Progressing Goal: Knowledge of secondary prevention will improve (MUST DOCUMENT ALL) Outcome: Not Progressing Goal: Knowledge of patient specific risk factors will improve (DELETE if not current risk factor) Outcome: Not Progressing

## 2023-11-23 NOTE — Evaluation (Signed)
Occupational Therapy Evaluation Patient Details Name: Steve Hickman MRN: [id number] DOB: [birth year] Today's Date: 11/23/2023   History of Present Illness Pt is 83 yo male who presents on 11/16/23 with AMS, found down, garbled speech, unable to use R side. 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  Possible NSTEMI as well. PMH: CAD s/p CABG, DM, LBBB, STEMI   Clinical Impression   Javani was evaluated s/p the above admission list. He lives alone in an apartment and is indep with ADL/IADLs and mobility at baseline. Upon evaluation the pt was limited by generalized weakness, drop in BP with dizziness, unsteady balance, R hemibody weakness with mild paraesthesia and limited activity tolerance. Overall he needed min A to get EOB and had fair balance despite report of dizziness. He stood with min A and RW with continued report of dizziness, min A needed to take pivotal steps with the RW. Due to the deficits listed below the pt also needs up to mod A for LB Adls and superivsion A for UB ADLs in sitting. Pt will benefit from continued acute OT services and intensive inpatient follow up therapy, >3 hours/day after discharge.  Sitting BP: 126/65   *dizziness* Standing BP: 107/67   *dizziness* Sitting after transfer: 114/77        If plan is discharge home, recommend the following: A little help with walking and/or transfers;A lot of help with bathing/dressing/bathroom;Assistance with cooking/housework;Direct supervision/assist for medications management;Direct supervision/assist for financial management;Assist for transportation;Help with stairs or ramp for entrance    Functional Status Assessment  Patient has had a recent decline in their functional status and demonstrates the ability to make significant improvements in function in a reasonable and predictable amount of time.  Equipment Recommendations  BSC/3in1;Other (comment) (RW)    Recommendations for Other  Services Rehab consult     Precautions / Restrictions Precautions Precautions: Fall Precaution Comments: watch BP Restrictions Weight Bearing Restrictions Per Provider Order: No      Mobility Bed Mobility Overal bed mobility: Needs Assistance Bed Mobility: Supine to Sit     Supine to sit: Min assist     General bed mobility comments: with cues    Transfers Overall transfer level: Needs assistance Equipment used: Rolling walker (2 wheels) Transfers: Sit to/from Stand, Bed to chair/wheelchair/BSC Sit to Stand: Min assist     Step pivot transfers: Min assist     General transfer comment: limited by dizziness, unsteady adn limited to pivotal steps on eval      Balance Overall balance assessment: Needs assistance Sitting-balance support: Feet supported Sitting balance-Leahy Scale: Fair     Standing balance support: Bilateral upper extremity supported, During functional activity Standing balance-Leahy Scale: Poor                             ADL either performed or assessed with clinical judgement   ADL Overall ADL's : Needs assistance/impaired Eating/Feeding: Set up;Sitting   Grooming: Set up;Sitting   Upper Body Bathing: Supervision/ safety;Sitting   Lower Body Bathing: Moderate assistance;Sit to/from stand   Upper Body Dressing : Supervision/safety;Sitting   Lower Body Dressing: Moderate assistance;Sitting/lateral leans   Toilet Transfer: Stand-pivot;Rolling walker (2 wheels);Minimal assistance Toilet Transfer Details (indicate cue type and reason): limited by dizziness Toileting- Clothing Manipulation and Hygiene: Contact guard assist;Sitting/lateral lean       Functional mobility during ADLs: Minimal assistance;Rolling walker (2 wheels) General ADL Comments: mod  A  for LB tasks for balance and safety, min A for mobility due to dizziness. Mild R weakness.     Vision Baseline Vision/History: 0 No visual deficits Vision Assessment?: No  apparent visual deficits     Perception Perception: Not tested       Praxis Praxis: Not tested       Pertinent Vitals/Pain Pain Assessment Pain Assessment: Faces Faces Pain Scale: No hurt Pain Intervention(s): Monitored during session     Extremity/Trunk Assessment Upper Extremity Assessment Upper Extremity Assessment: RUE deficits/detail RUE Deficits / Details: ROM is WFL, MMT is globally 4-/5. Pt reports mild paraesthesias distally. He was able to use functionally for mobility and RW use RUE Sensation: decreased light touch RUE Coordination: decreased fine motor   Lower Extremity Assessment Lower Extremity Assessment: Defer to PT evaluation   Cervical / Trunk Assessment Cervical / Trunk Assessment: Normal   Communication Communication Communication: Hearing impairment   Cognition Arousal: Alert Behavior During Therapy: WFL for tasks assessed/performed Overall Cognitive Status: No family/caregiver present to determine baseline cognitive functioning           General Comments: HOH. oriented to self, time and place. He was not aware he had been here for 6 days. He followed all simple 1 step commands well     General Comments  BP dropped form sitting to standing, pt reported dizziness.            Home Living Family/patient expects to be discharged to:: Private residence Living Arrangements: Alone Available Help at Discharge: Other (Comment);Available PRN/intermittently (ex-wife) Type of Home: Apartment Home Access: Stairs to enter Entrance Stairs-Number of Steps: 3-4   Home Layout: One level     Bathroom Shower/Tub: Chief Strategy Officer: Standard     Home Equipment: None          Prior Functioning/Environment Prior Level of Function : Independent/Modified Independent             Mobility Comments: indep, no AD. Reports 2 falls outside of the home several months ago ADLs Comments: indep ADLs, IADLs        OT Problem List:  Decreased strength;Decreased range of motion;Decreased activity tolerance;Impaired balance (sitting and/or standing);Decreased safety awareness;Decreased knowledge of use of DME or AE;Decreased knowledge of precautions      OT Treatment/Interventions: Self-care/ADL training;Therapeutic exercise;Neuromuscular education;DME and/or AE instruction;Therapeutic activities;Patient/family education;Balance training    OT Goals(Current goals can be found in the care plan section) Acute Rehab OT Goals Patient Stated Goal: to get better OT Goal Formulation: With patient Time For Goal Achievement: 12/07/23 Potential to Achieve Goals: Good ADL Goals Pt Will Perform Grooming: with modified independence;standing Pt Will Perform Lower Body Dressing: with modified independence;sit to/from stand Pt Will Transfer to Toilet: with modified independence;ambulating Additional ADL Goal #1: Pt will tolerate at least 10 minutes OOB activity to demonstrate improved tolerance for ADLs at discharge  OT Frequency: Min 1X/week       AM-PAC OT "6 Clicks" Daily Activity     Outcome Measure Help from another person eating meals?: A Little Help from another person taking care of personal grooming?: A Little Help from another person toileting, which includes using toliet, bedpan, or urinal?: A Little Help from another person bathing (including washing, rinsing, drying)?: A Lot Help from another person to put on and taking off regular upper body clothing?: A Little Help from another person to put on and taking off regular lower body clothing?: A Lot 6 Click Score: 16  End of Session Equipment Utilized During Treatment: Rolling walker (2 wheels);Gait belt Nurse Communication: Mobility status (drop in BP)  Activity Tolerance: Patient tolerated treatment well Patient left: in chair;with call bell/phone within reach;with chair alarm set  OT Visit Diagnosis: Unsteadiness on feet (R26.81);Other abnormalities of gait and  mobility (R26.89);History of falling (Z91.81);Muscle weakness (generalized) (M62.81);Hemiplegia and hemiparesis Hemiplegia - Right/Left: Right Hemiplegia - dominant/non-dominant: Dominant Hemiplegia - caused by: Cerebral infarction                Time: 1610-9604 OT Time Calculation (min): 21 min Charges:  OT General Charges $OT Visit: 1 Visit OT Evaluation $OT Eval Moderate Complexity: 1 Mod  Derenda Mis, OTR/L Acute Rehabilitation Services Office [phone] Secure Chat Communication Preferred   Donia Pounds 11/23/2023, 12:31 PM

## 2023-11-23 NOTE — TOC Progression Note (Signed)
Transition of Care [facility]) - Progression Note    Patient Details  Name: Steve Hickman MRN: [id number] Date of Birth: 16-Nov-1940  Transition of Care [facility]) CM/SW Contact  Kermit Balo, RN Phone Number: 11/23/2023, 2:38 PM  Clinical Narrative:     CIR to assess.  TOC following.  Expected Discharge Plan: Hospice Medical Facility Barriers to Discharge: Hospice Bed not available  Expected Discharge Plan and Services   Discharge Planning Services: CM Consult Post Acute Care Choice: Hospice                                         Social Determinants of Health (SDOH) Interventions SDOH Screenings   Food Insecurity: Low Risk  (11/10/2023)   Received from [facility]  Housing: Low Risk  (11/10/2023)   Received from [facility]  Transportation Needs: No Transportation Needs (11/10/2023)   Received from [facility]  Utilities: Low Risk  (11/10/2023)   Received from [facility]  Social Connections: Unknown (03/11/2022)   Received from [facility]  Tobacco Use: Low Risk  (11/10/2023)   Received from [facility]    Readmission Risk Interventions     No data to display

## 2023-11-23 NOTE — Progress Notes (Signed)
Palliative:  ***  Yong Channel, NP Palliative Medicine Team Pager [phone] (Please see [email or web address] for schedule) Team Phone [phone]

## 2023-11-23 NOTE — Plan of Care (Signed)
Problem: Education: Goal: Knowledge of disease or condition will improve Outcome: Progressing Goal: Knowledge of secondary prevention will improve (MUST DOCUMENT ALL) Outcome: Progressing

## 2023-11-23 NOTE — Progress Notes (Signed)
Inpatient Rehab Admissions Coordinator:  ? ?Per therapy recommendations,  patient was screened for CIR candidacy by Megan Salon, MS, CCC-SLP. At this time, Pt. Appears to be a a potential candidate for CIR. I will place   order for rehab consult per protocol for full assessment. Please contact me any with questions. ? ?Megan Salon, MS, CCC-SLP ?Rehab Admissions Coordinator  ?[phone] (celll) ?[phone] (office) ? ?

## 2023-11-23 NOTE — Progress Notes (Signed)
Inpatient Rehab Admissions:  Inpatient Rehab Consult received.  I met with patient and ex-wife Rubin Payor at the bedside for rehabilitation assessment and to discuss goals and expectations of an inpatient rehab admission.  Discussed average length of stay, insurance authorization requirement and discharge home after completion of CIR. Both acknowledged understanding. Rubin Payor is going to talk to pt's family  to determine if 24/7 support can be arranged. They asked that an [facility] follow up on Wednesday.  Will continue to follow.  Signed: Wolfgang Phoenix, MS, CCC-SLP Admissions Coordinator [phone]

## 2023-11-23 NOTE — Progress Notes (Signed)
[facility] Progress Note Patient: Steve Hickman IHK:[id number] DOB: 1941/01/18 DOA: 11/16/2023  DOS: the patient was seen and examined on 11/23/2023 Brief Hospital Course: PMH of CAD s/p CABG x 2 (LIMA-LAD, SV-PDA) 07/07/13 (EF 60%), DM, Dyslipidemia, LBBB and prior STEMI who was BIB [facility] from home after ex wife called for AMS . Found to have stroke, sepsis and AKI as well as elevated troponin. Initially was transition to comfort care. On 1/25 family wants to transition back out of comfort care measures.  Goal of care is currently to treat what is treatable.  As the patient has significant improvement in his mentation.  Assessment and Plan: Acute ischemic stroke: Initial CT head unremarkable, MRI brain confirmed 6 mm acute ischemic nonhemorrhagic deep white matter infarct involving the left frontal centrum semi ovale.   Seen by neurology.   Initial plan was to resume home aspirin and continue Plavix along with atorvastatin however later on when seen by cardiology, diagnosed with possible NSTEMI, patient started on heparin and antiplatelets were discontinued.   Patient to be seen by PT OT and SLP.   Resume aspirin Plavix for now.   Acute metabolic encephalopathy:  Likely secondary to metabolic derangements, AKI and, on assessment of the stroke. Treat underlying causes.  Severe hypernatremia. From poor p.o. intake. Treated with D5.  Now on hold.  Sodium level improving.  Monitor.  Hypokalemia. Replaced per  Severe B12 deficiency. Will replace with subcutaneous injection. Also treat with B complex vitamins.  Presumed Sepsis due to bacterial cause,  Ruled out: Patient presented with elevated wbc but bradycardia along with lactic acidosis. However all of these appear to be secondary to profound dehydration leading to AKI and rhabdomyolysis. Procalcitonin only 0.36, borderline and unreliable in the setting of CKD. Patient was started on Zosyn and vancomycin.  Now  stopped.   AKI on CKD stage IIIa secondary to rhabdomyolysis/metabolic acidosis/hyperkalemia/dehydration:  Baseline creatinine 1.4-1.6 just 2 months ago. Presented with creatinine of 2.86 which has remained stable. Ultrasound renal negative. Nephrology was consulted.  Despite of IV fluids, CK rising. Renal function now appears to be close to baseline.   Non-STEMI. CAD SP CABG. Patient unable to provide history due to aphasia due to stroke. High sensitivity troponin elevated 1018->1138->1286> 2043> 2087 on arrival.   Seen by cardiology, out of concern of NSTEMI, started on heparin drip after clearance from neurology. Echo EF 65 to 70%.  No wall motion abnormality.  RV function is reduced. Back on aspirin Plavix and statin.   Concern for high-grade AV block/complete heart block:  Could be due to electrolyte abnormalities and AKI.  Defer to cardiology.   Severe hypomagnesemia  Replaced  Type 2 diabetes mellitus, without long-term insulin use, well-controlled with renal complication. On glimepiride and metformin at home.  Hemoglobin A1c 7.3. Currently receiving dextrose to correct sodium.  Monitor.   HLD Statin was on hold due to rhabdomyolysis. Now back on aspirin Plavix and statin.   Anemia ,Macrocytic/B12 deficiency Hemoglobin appears stable at baseline.  B12 is undetectable. Continue supplemental B12 injections.  Continue B complex as well.   Goal of care conversation: Patient was admitted as full code. Due to multiple comorbidities as well as active issues patient was transition to complete comfort with consultation with Palliative care  and family. Mentation currently improving and therefore family wants to switch out of comfort care. Maintain DNR. Monitor progression.  Adult failure to thrive. With B12 deficiency, low LDL suspect patient is suffering from failure to thrive.  Dietitian consulted. At risk for refeeding syndrome.  Monitor.   Agitation. Started on  Zyprexa.  Monitor.  Foley catheter. Inserted during the earlier admission likely secondary to presentation with AKI and critically ill. Will remove and initiate Flomax and monitor.  Subjective: No acute complaint.  No nausea no vomiting no fever no chills.  Oral intake adequate.  Physical Exam: In no distress from S1-S2 present. Clear to auscultation. Bowel sound present. Able to follow commands.  No agitation.  Data Reviewed: I have Reviewed nursing notes, Vitals, and Lab results. Reviewed CBC and BMP.  Reordered CBC and BMP.  Disposition: Status is: Inpatient Remains inpatient appropriate because: Need for therapy to correct sodium   Family Communication: Discussed with wife on phone on 1/25. Level of care: Med-Surg   Vitals:   11/23/23 0340 11/23/23 0728 11/23/23 1105 11/23/23 1529  BP: (!) 138/94 (!) 144/97 119/77 (!) 91/57  Pulse: 94 97 93 100  Resp: 18  20   Temp: 98.7 F (37.1 C) 98.6 F (37 C) (!) 97.5 F (36.4 C) (!) 97.5 F (36.4 C)  TempSrc: Oral Oral Oral Oral  SpO2: 94% 97% 98% 97%  Weight:      Height:         Author: Lynden Oxford, MD 11/23/2023 6:15 PM  Please look on [email or web address] to find out who is on call.

## 2023-11-24 ENCOUNTER — Inpatient Hospital Stay (HOSPITAL_COMMUNITY): Payer: 59

## 2023-11-24 DIAGNOSIS — Z515 Encounter for palliative care: Secondary | ICD-10-CM | POA: Diagnosis not present

## 2023-11-24 DIAGNOSIS — Z7189 Other specified counseling: Secondary | ICD-10-CM | POA: Diagnosis not present

## 2023-11-24 DIAGNOSIS — I639 Cerebral infarction, unspecified: Secondary | ICD-10-CM | POA: Diagnosis not present

## 2023-11-24 LAB — CBC
HCT: 34 % — ABNORMAL LOW (ref 39.0–52.0)
Hemoglobin: 11.4 g/dL — ABNORMAL LOW (ref 13.0–17.0)
MCH: 31.9 pg (ref 26.0–34.0)
MCHC: 33.5 g/dL (ref 30.0–36.0)
MCV: 95.2 fL (ref 80.0–100.0)
Platelets: 148 10*3/uL — ABNORMAL LOW (ref 150–400)
RBC: 3.57 MIL/uL — ABNORMAL LOW (ref 4.22–5.81)
RDW: 12.6 % (ref 11.5–15.5)
WBC: 11.1 10*3/uL — ABNORMAL HIGH (ref 4.0–10.5)
nRBC: 0 % (ref 0.0–0.2)

## 2023-11-24 LAB — PHOSPHORUS: Phosphorus: 2.5 mg/dL (ref 2.5–4.6)

## 2023-11-24 LAB — MAGNESIUM: Magnesium: 1.7 mg/dL (ref 1.7–2.4)

## 2023-11-24 NOTE — Progress Notes (Signed)
Physical Therapy Treatment Patient Details Name: Steve Hickman MRN: [id number] DOB: 1941/09/18 Today's Date: 11/24/2023   History of Present Illness Pt is 83 yo male who presents on 11/16/23 with AMS, found down, garbled speech, unable to use R side. 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  Possible NSTEMI as well. PMH: CAD s/p CABG, DM, LBBB, STEMI    PT Comments  Prior to session, RN notified me that pt had walked to bathroom this morning and had an issue with low BP and near syncope. Initiated session with orthostatic BPs (see below) with pt's DBP dropping 10 mmHg. Pt became increasingly dizzy while standing and could not stand until 3 minutes. Completed seated exercises and BP recovered with dizziness improved.     11/24/23 1500  Vital Signs  Patient Position (if appropriate) Orthostatic Vitals  Orthostatic Lying   BP- Lying 99/68  Pulse- Lying 94  Orthostatic Sitting  BP- Sitting 98/68  Pulse- Sitting 103  Orthostatic Standing at 0 minutes  BP- Standing at 0 minutes 94/58  Pulse- Standing at 0 minutes 106  *Could not stand for 3 minutes due to increasing dizziness Upon sitting BP 89/74 HR 106 After seated exercises BP 110/80 HR 103    If plan is discharge home, recommend the following: A little help with walking and/or transfers;A little help with bathing/dressing/bathroom;Assistance with cooking/housework;Direct supervision/assist for medications management;Direct supervision/assist for financial management;Assist for transportation;Help with stairs or ramp for entrance;Supervision due to cognitive status   Can travel by private vehicle        Equipment Recommendations  Rollator (4 wheels)    Recommendations for Other Services       Precautions / Restrictions Precautions Precautions: Fall Precaution Comments: watch BP Restrictions Weight Bearing Restrictions Per Provider Order: No     Mobility  Bed Mobility Overal bed mobility:  Needs Assistance Bed Mobility: Supine to Sit, Sit to Supine     Supine to sit: Contact guard Sit to supine: Supervision   General bed mobility comments: HOB flat and no rails    Transfers Overall transfer level: Needs assistance Equipment used: Rolling walker (2 wheels) Transfers: Sit to/from Stand Sit to Stand: Contact guard assist           General transfer comment: +dizziness    Ambulation/Gait               General Gait Details: while checking orthostatics, pt became too dizzy and had to sit down. By DBP measure, +orthostasis   Stairs             Wheelchair Mobility     Tilt Bed    Modified Rankin (Stroke Patients Only) Modified Rankin (Stroke Patients Only) Pre-Morbid Rankin Score: No symptoms Modified Rankin: Moderately severe disability     Balance Overall balance assessment: Needs assistance Sitting-balance support: Feet supported Sitting balance-Leahy Scale: Fair     Standing balance support: Bilateral upper extremity supported, During functional activity Standing balance-Leahy Scale: Poor                              Cognition Arousal: Alert Behavior During Therapy: WFL for tasks assessed/performed Overall Cognitive Status: No family/caregiver present to determine baseline cognitive functioning                                 General Comments: HOH. oriented  to self and place. (Time NT) He followed all simple 1 step commands well        Exercises General Exercises - Lower Extremity Long Arc Quad: AROM, Both, 10 reps Heel Raises: AROM, Both, 10 reps, Seated    General Comments General comments (skin integrity, edema, etc.): see vitals flowsheet for orthostatic BPs      Pertinent Vitals/Pain Pain Assessment Pain Assessment: No/denies pain    Home Living                          Prior Function            PT Goals (current goals can now be found in the care plan section) Acute Rehab  PT Goals Patient Stated Goal: get stronger and go home Time For Goal Achievement: 12/07/23 Potential to Achieve Goals: Good Progress towards PT goals: Not progressing toward goals - comment (dizziness/orthostasis)    Frequency    Min 1X/week      PT Plan      Co-evaluation              AM-PAC PT "6 Clicks" Mobility   Outcome Measure  Help needed turning from your back to your side while in a flat bed without using bedrails?: None Help needed moving from lying on your back to sitting on the side of a flat bed without using bedrails?: None Help needed moving to and from a bed to a chair (including a wheelchair)?: A Little Help needed standing up from a chair using your arms (e.g., wheelchair or bedside chair)?: A Little Help needed to walk in hospital room?: Total Help needed climbing 3-5 steps with a railing? : Total 6 Click Score: 16    End of Session Equipment Utilized During Treatment: Gait belt Activity Tolerance: Treatment limited secondary to medical complications (Comment) (dizziness, drop in BP) Patient left: with call bell/phone within reach;in bed;with bed alarm set Nurse Communication: Mobility status;Other (comment) (orthostatic) PT Visit Diagnosis: Unsteadiness on feet (R26.81);Muscle weakness (generalized) (M62.81)     Time: 1546-1600 PT Time Calculation (min) (ACUTE ONLY): 14 min  Charges:    $Therapeutic Activity: 8-22 mins PT General Charges $$ ACUTE PT VISIT: 1 Visit                      Jerolyn Center, PT Acute Rehabilitation Services  Office [phone]    Zena Amos 11/24/2023, 4:10 PM

## 2023-11-24 NOTE — Consult Note (Signed)
Physical Medicine and Rehabilitation Consult Reason for Consult:Stroke with aphasia/ and dysphagia and metabolic encephalopathy Referring Physician: Dr Lynden Oxford   HPI: Steve Hickman is a 83 y.o. R handed male with hx of CAD with CABG in 2014; STEMI; LBBBB, HLD, and DM with A1c 7.3- admitted 11/16/23- due to AMS. Patient was found to have a 6mm deep white matter frontal stroke in cetral semi ovale.  Initially they thought he was also septic, but now off IV ABX.  He's also not eating well- and was on D5 NS as well as back on D5 NS today due to rising Cr- up to 1.99 today- baseline is ~ 1.4-1.6.  He's also developing hyper natremia- up to 149 max but 145 last evening.  His foley was removed yesterday and condom catheter was placed.  Pt on D1 thin diet after seen by SLP.  He reports he just woke up- and needs to pee- cannot understand that has condom cath and can just void.   Pt reports he has an ex wife, but she isn't close to him as welll as a son, but not really involved in each other's lives.   Doesn't remember that he had stroke or why here in hospital.    ROS limited by aphasia/metabolic encephalopathy- doesn't even remember why here- does think had BM yesterday- does deny any pain including HA's-  just woke up and said needs to pee.    Review of Systems  Unable to perform ROS: Mental acuity  All other systems reviewed and are negative.  Past Medical History:  Diagnosis Date   CAD s/p CABG x 2 (LIMA-LAD, SV-PDA) 07/07/13 (EF 60%)  05/29/2013   Diabetes mellitus (HCC) 05/31/2013   Dyslipidemia, goal LDL below 70 05/31/2013   Left bundle branch block    STEMI (ST elevation myocardial infarction)- CFX BMS, 05/28/2013 05/29/2013   STEMI    Past Surgical History:  Procedure Laterality Date   CORONARY ARTERY BYPASS GRAFT N/A 07/07/2013   Procedure: CORONARY ARTERY BYPASS GRAFTING (CABG);  Surgeon: Loreli Slot, MD;  Location[facility] OR;  Service: Open Heart Surgery;   Laterality: N/A;  CABG X 2   LEFT HEART CATH N/A 05/28/2013   Procedure: LEFT HEART CATH;  Surgeon: Runell Gess, MD;  Location: [facility] CATH LAB;  Service: Cardiovascular;  Laterality: N/A;   NO PAST SURGERIES     PERCUTANEOUS CORONARY STENT INTERVENTION (PCI-S)  05/28/2013   Procedure: PERCUTANEOUS CORONARY STENT INTERVENTION (PCI-S);  Surgeon: Runell Gess, MD;  Location: [facility] CATH LAB;  Service: Cardiovascular;;  prox cx   Family History  Problem Relation Age of Onset   Healthy Mother        NO CARDIAC RELATED HEALTH ISSUES    Healthy Father        NO CARDIAC RELATED HEALTH ISSUES    Stroke Father    Healthy Sister        NO CARDIAC RELATED HEALTH ISSUES    Heart attack Neg Hx    Hypertension Neg Hx    Social History:  reports that he has never smoked. He has never used smokeless tobacco. He reports current alcohol use of about 1.0 standard drink of alcohol per week. He reports that he does not use drugs. Allergies: No Known Allergies Medications Prior to Admission  Medication Sig Dispense Refill   aspirin EC 81 MG EC tablet Take 1 tablet (81 mg total) by mouth daily.     atorvastatin (LIPITOR) 80  MG tablet TAKE 1 TABLET BY MOUTH ONCE DAILY AT 6 PM 90 tablet 3   glimepiride (AMARYL) 2 MG tablet Take 2 mg by mouth daily with breakfast.     metFORMIN (GLUCOPHAGE) 1000 MG tablet Take 1,000 mg by mouth 2 (two) times daily with a meal.     metoprolol tartrate (LOPRESSOR) 25 MG tablet Take 1/2 (one-half) tablet by mouth twice daily (Patient taking differently: Take 12.5 mg by mouth 2 (two) times daily.) 90 tablet 3   pantoprazole (PROTONIX) 40 MG tablet Take 1 tablet (40 mg total) by mouth daily. 90 tablet 2    Home: Home Living Family/patient expects to be discharged to:: Private residence Living Arrangements: Alone Available Help at Discharge: Other (Comment), Available PRN/intermittently (ex-wife) Type of Home: Apartment Home Access: Stairs to enter Entrance Stairs-Number of  Steps: 3-4 Home Layout: One level Bathroom Shower/Tub: Engineer, manufacturing systems: Standard Home Equipment: None  Functional History: Prior Function Prior Level of Function : Independent/Modified Independent Mobility Comments: indep, no AD. Reports 2 falls outside of the home several months ago ADLs Comments: indep ADLs, IADLs Functional Status:  Mobility: Bed Mobility General bed mobility comments: repositioned pt in bed into midline and pt resistant to mvmt. Transfers Overall transfer level: Needs assistance Equipment used: Rolling walker (2 wheels) Transfers: Sit to/from Stand, Bed to chair/wheelchair/BSC Sit to Stand: Min assist General transfer comment: denied initial dizziness Ambulation/Gait Ambulation/Gait assistance: Min assist, Mod assist Gait Distance (Feet): 35 Feet Assistive device: Rolling walker (2 wheels) Gait Pattern/deviations: Step-through pattern, Decreased stride length, Shuffle, Trunk flexed General Gait Details: pt min assist until final 3 feet of ambulation when knees began to give out with PT catching pt and assisting into chair. BP 73/50 Gait velocity interpretation: <1.8 ft/sec, indicate of risk for recurrent falls    ADL: ADL Overall ADL's : Needs assistance/impaired Eating/Feeding: Set up, Sitting Grooming: Set up, Sitting Upper Body Bathing: Supervision/ safety, Sitting Lower Body Bathing: Moderate assistance, Sit to/from stand Upper Body Dressing : Supervision/safety, Sitting Lower Body Dressing: Moderate assistance, Sitting/lateral leans Toilet Transfer: Stand-pivot, Rolling walker (2 wheels), Minimal assistance Toilet Transfer Details (indicate cue type and reason): limited by dizziness Toileting- Clothing Manipulation and Hygiene: Contact guard assist, Sitting/lateral lean Functional mobility during ADLs: Minimal assistance, Rolling walker (2 wheels) General ADL Comments: mod  A for LB tasks for balance and safety, min A for mobility  due to dizziness. Mild R weakness.  Cognition: Cognition Overall Cognitive Status: No family/caregiver present to determine baseline cognitive functioning Orientation Level: Oriented to person, Oriented to place, Disoriented to time, Disoriented to situation Cognition Arousal: Alert Behavior During Therapy: [facility] for tasks assessed/performed Overall Cognitive Status: No family/caregiver present to determine baseline cognitive functioning General Comments: HOH. oriented to self, time and place. He was not aware he had been here for 6 days. He followed all simple 1 step commands well  Blood pressure 101/62, pulse 95, temperature 97.6 F (36.4 C), temperature source Oral, resp. rate 17, height 5\' 5"  (1.651 m), weight 64.1 kg, SpO2 98%. Physical Exam Vitals and nursing note reviewed.  Constitutional:      Appearance: Normal appearance. He is normal weight.     Comments: Just woke up- a little confused- sitting up in bed- lunch in front of him- no move to touch/eat, NAD  HENT:     Head: Normocephalic and atraumatic.     Comments: Missing 1 front tooth Mild R facial droop Tongue coated, but midline    Right Ear: External  ear normal.     Left Ear: External ear normal.     Nose: Nose normal. No congestion.     Mouth/Throat:     Mouth: Mucous membranes are dry.     Pharynx: Oropharyngeal exudate present.  Eyes:     General:        Right eye: No discharge.        Left eye: No discharge.     Extraocular Movements: Extraocular movements intact.  Cardiovascular:     Rate and Rhythm: Normal rate and regular rhythm.     Heart sounds: Normal heart sounds. No murmur heard.    No gallop.  Pulmonary:     Effort: Pulmonary effort is normal. No respiratory distress.     Breath sounds: Normal breath sounds. No wheezing, rhonchi or rales.  Abdominal:     General: Bowel sounds are normal. There is no distension.     Palpations: Abdomen is soft.     Tenderness: There is no abdominal tenderness.   Genitourinary:    Comments: Condom cath Musculoskeletal:     Cervical back: Neck supple. No tenderness.     Comments: RUE 4/5 LUE 5-/5 RLE- HF 2/5; KE 2+/5 DF/PF 4-/5 LLE- 5-/5 throughout  Skin:    Comments: Bruises in Ue's and R forearm IV wrapped to cover it- so pt cannot pull out  Neurological:     Mental Status: He is alert. He is disoriented.     Comments: Pt sitting up- appears to not know why in hospital- knows at [facility], but not why evne after explained why Knew year, not month/day/date.  Intact to light touch in all 4 extremities Mild word finding issues Poor memory  Psychiatric:     Comments: Very flat     Results for orders placed or performed during the hospital encounter of 11/16/23 (from the past 24 hours)  Basic metabolic panel     Status: Abnormal   Collection Time: 11/23/23  7:49 PM  Result Value Ref Range   Sodium 145 135 - 145 mmol/L   Potassium 3.6 3.5 - 5.1 mmol/L   Chloride 113 (H) 98 - 111 mmol/L   CO2 22 22 - 32 mmol/L   Glucose, Bld 238 (H) 70 - 99 mg/dL   BUN 37 (H) 8 - 23 mg/dL   Creatinine, Ser 1.61 (H) 0.61 - 1.24 mg/dL   Calcium 8.4 (L) 8.9 - 10.3 mg/dL   GFR, Estimated 32 (L) >60 mL/min   Anion gap 10 5 - 15  CBC     Status: Abnormal   Collection Time: 11/24/23  5:41 AM  Result Value Ref Range   WBC 11.1 (H) 4.0 - 10.5 K/uL   RBC 3.57 (L) 4.22 - 5.81 MIL/uL   Hemoglobin 11.4 (L) 13.0 - 17.0 g/dL   HCT 09.6 (L) 04.5 - 40.9 %   MCV 95.2 80.0 - 100.0 fL   MCH 31.9 26.0 - 34.0 pg   MCHC 33.5 30.0 - 36.0 g/dL   RDW 81.1 91.4 - 78.2 %   Platelets 148 (L) 150 - 400 K/uL   nRBC 0.0 0.0 - 0.2 %  Magnesium     Status: None   Collection Time: 11/24/23  5:41 AM  Result Value Ref Range   Magnesium 1.7 1.7 - 2.4 mg/dL  Phosphorus     Status: None   Collection Time: 11/24/23  5:41 AM  Result Value Ref Range   Phosphorus 2.5 2.5 - 4.6 mg/dL   DG Abd Portable  1V Result Date: 11/24/2023 CLINICAL DATA:  Constipation. EXAM: PORTABLE ABDOMEN - 1  VIEW COMPARISON:  Abdominal CT dated 04/16/2022. FINDINGS: Mild colonic stool burden. No bowel dilatation or evidence of obstruction. No free air or radiopaque calculi. Degenerative changes of the spine. No acute osseous pathology. IMPRESSION: Mild colonic stool burden. No bowel obstruction. Electronically Signed   By: Elgie Collard M.D.   On: 11/24/2023 14:38     Assessment/Plan: Diagnosis:  L frontal white matter- deep 6mm- stroke with Delirium/aphasia Does the need for close, 24 hr/day medical supervision in concert with the patient's rehab needs make it unreasonable for this patient to be served in a less intensive setting? Potentially Co-Morbidities requiring supervision/potential complications: LBBB, DM A1c 7.3; metabolic encephalopathy, CAD/CABG 2014; STEMI; dysphagia- on D1 thin diet; AKI/ARF with Cr up to 1.99- with CKD3b Due to bladder management, bowel management, safety, skin/wound care, disease management, medication administration, pain management, and patient education, does the patient require 24 hr/day rehab nursing? Yes Does the patient require coordinated care of a physician, rehab nurse, therapy disciplines of PT, OT and SLP to address physical and functional deficits in the context of the above medical diagnosis(es)? Yes Addressing deficits in the following areas: balance, endurance, locomotion, strength, transferring, bowel/bladder control, bathing, dressing, feeding, grooming, toileting, cognition, speech, language, swallowing, and psychosocial support Can the patient actively participate in an intensive therapy program of at least 3 hrs of therapy per day at least 5 days per week? Potentially The potential for patient to make measurable gains while on inpatient rehab is fair Anticipated functional outcomes upon discharge from inpatient rehab are min assist  with PT, min assist with OT, supervision and min assist with SLP. Estimated rehab length of stay to reach the above  functional goals is: ~ 2 weeks- if family can take him home doing PHYSICAL assistance- pt cannot answer this- says son not involved in his life and has "ex-wife" only.  Anticipated discharge destination:  not clear yet Overall Rehab/Functional Prognosis: fair  RECOMMENDATIONS: This patient's condition is appropriate for continued rehabilitative care in the following setting:  pt dispo not clear yet- so until clear ex wife can do PHYSICAL assistance and 24/7 at home-  Patient has agreed to participate in recommended program. Potentially pt is slightly confused Note that insurance prior authorization may be required for reimbursement for recommended care.  Comment:  Pt doesn't have clear dispo at this time- ex-wife would need to be able to do 24/7 physical assistance to get him to CIR- I'm really not sure he has this- he also notes he's not involved with his son and wasn't able to name anyone else to take him home.  Pt was just made DNR/DNI with palliative care  Pt's bowels and bladder working OK- except rising Cr- back on IVFs Agree with having foley out-  Will have admissions coordinators look more into case, esp because pt not clear about his situation at this time.  Thanks you for this consult   I spent a total of 65   minutes on total care today- >50% coordination of care- due to  Review of chart, palliative care notes, progress notes from Hospitalist and SW notes- also examination and hx from pt and documentation as detailed above  Genice Rouge, MD 11/24/2023

## 2023-11-24 NOTE — Progress Notes (Signed)
[facility] Progress Note Patient: Steve Hickman ZHY:[id number] DOB: 09-10-1941 DOA: 11/16/2023  DOS: the patient was seen and examined on 11/24/2023 Brief Hospital Course: PMH of CAD s/p CABG x 2 (LIMA-LAD, SV-PDA) 07/07/13 (EF 60%), DM, Dyslipidemia, LBBB and prior STEMI who was BIB [facility] from home after ex wife called for AMS . Found to have stroke, sepsis and AKI as well as elevated troponin. Initially was transition to comfort care. On 1/25 family wants to transition back out of comfort care measures.  Goal of care is currently to treat what is treatable.  As the patient has significant improvement in his mentation.  Assessment and Plan: Acute ischemic stroke: Initial CT head unremarkable, MRI brain confirmed 6 mm acute ischemic nonhemorrhagic deep white matter infarct involving the left frontal centrum semi ovale.   Seen by neurology.   Initial plan was to resume home aspirin and continue Plavix along with atorvastatin however later on when seen by cardiology, diagnosed with possible NSTEMI, patient started on heparin and antiplatelets were discontinued.   Patient to be seen by PT OT and SLP.   Resume aspirin Plavix for now.   Acute metabolic encephalopathy:  Likely secondary to metabolic derangements, AKI and, on assessment of the stroke. Treat underlying causes.  Severe hypernatremia. From poor p.o. intake. Treated with D5.   Hypokalemia. Replaced  Severe B12 deficiency. Being replaced with subcutaneous injection. Also treat with B complex vitamins.  Presumed Sepsis due to bacterial cause,  Ruled out: Patient presented with elevated wbc but bradycardia along with lactic acidosis. However all of these appear to be secondary to profound dehydration leading to AKI and rhabdomyolysis. Procalcitonin only 0.36, borderline and unreliable in the setting of CKD. Patient was started on Zosyn and vancomycin.  Now stopped.   AKI on CKD stage IIIa secondary to  rhabdomyolysis/metabolic acidosis/hyperkalemia/dehydration:  Baseline creatinine 1.4-1.6 just 2 months ago. Presented with creatinine of 2.86 which has remained stable. Ultrasound renal negative. Nephrology was consulted.  Despite of IV fluids, CK rising. Renal function improved significantly close to baseline.  Interestingly after receiving IV fluids and eating more food, renal function now appears to be worsening again serum creatinine is 1.99 BUN is 37. Will monitor.   Non-STEMI. CAD SP CABG. Patient unable to provide history due to aphasia due to stroke. High sensitivity troponin elevated 1018->1138->1286> 2043> 2087 on arrival.   Seen by cardiology, out of concern of NSTEMI, started on heparin drip after clearance from neurology. Echo EF 65 to 70%.  No wall motion abnormality.  RV function is reduced. Back on aspirin Plavix and statin.   Concern for high-grade AV block/complete heart block:  Could be due to electrolyte abnormalities and AKI.  Defer to cardiology.   Severe hypomagnesemia  Replaced  Type 2 diabetes mellitus, without long-term insulin use, well-controlled with renal complication. On glimepiride and metformin at home.  Hemoglobin A1c 7.3. Currently receiving dextrose to correct sodium.  Monitor.   HLD Statin was on hold due to rhabdomyolysis. Now back on aspirin Plavix and statin.   Anemia ,Macrocytic/B12 deficiency Hemoglobin appears stable at baseline.  B12 is undetectable. Continue supplemental B12 injections.  Continue B complex as well.   Goal of care conversation: Patient was admitted as full code. Due to multiple comorbidities as well as active issues patient was transition to complete comfort with consultation with Palliative care  and family. Mentation currently improving and therefore family wants to switch out of comfort care. Maintain DNR. Monitor progression.  Especially renal  function.  Adult failure to thrive. With B12 deficiency, low LDL  suspect patient is suffering from failure to thrive. Dietitian consulted. At risk for refeeding syndrome.  Monitor.   Agitation. Started on Zyprexa.  Monitor.  Foley catheter. Inserted during the earlier admission likely secondary to presentation with AKI and critically ill. Able to void.  Bladder scan ordered.  Subjective: Denies any acute complaint.  No nausea no vomiting no fever no chills.  No abdominal pain.  Peeing okay.  Still constipated.  Physical Exam: In no distress. S1-S2 present. Clear to auscultation. Bowel sound present. Nontender. No edema. No focal deficit.  Data Reviewed: I have Reviewed nursing notes, Vitals, and Lab results. Reviewed CBC and BMP.  Reordered BMP.  Disposition: Status is: Inpatient Remains inpatient appropriate because: Monitor renal function.  Awaiting placement to CIR.  Family Communication: Discussed with wife on phone earlier. Level of care: Med-Surg   Vitals:   11/24/23 1100 11/24/23 1505 11/24/23 1510 11/24/23 1618  BP: 101/62 (!) 89/74 110/80 104/66  Pulse: 95   99  Resp: 17   16  Temp: 97.6 F (36.4 C)   97.9 F (36.6 C)  TempSrc: Oral   Oral  SpO2: 98%   96%  Weight:      Height:         Author: Lynden Oxford, MD 11/24/2023 6:03 PM  Please look on [email or web address] to find out who is on call.

## 2023-11-24 NOTE — Progress Notes (Signed)
Palliative:  HPI: 83 y.o. 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 Also noted to have possible sepsis, AKI on CKD stage 3a, demand ischemia vs NSTEMI, high-grade AV block/complete heart block. Palliative Medicine was consulted for goals of care.  After discussion with family, patient was transitioned to comfort care on 1/21. Patient's mental status later improved, and comfort care was reversed on 1/25.   I met today with Mr. Sitter. No family or visitors. Sitting up in bed and able to feed himself breakfast - seems to be eating most of his breakfast. Noted IVF started overnight. Watching kidneys carefully. Discussed with Mr. Teed who continues to agree with continuing medical care to get him as good as possible. We did discuss his wishes to pursue more aggressive interventions such as dialysis and he initially says yes but after further discussion of what this would look like and mean for him he agrees this may not be a good idea. We agree to continue conservative measures with pills and IVs but avoiding more aggressive/invasive measures if he were to worsen. He is hoping that he can pursue rehab and continue to improve.   I called and spoke with wife Rubin Payor (they were never divorced - unclear if they are legally separated). Mr. Artley confirms Rubin Payor to be his emergency contact and surrogate decision maker. I reviewed with Rubin Payor Mr. Cato improvement from when he was first admitted. Rubin Payor expresses happiness that he has improved and that hopeful this can continue. Rubin Payor is aware of Mr. Buddenhagen multiple health issues and we are unsure how good we can get him and how long we can keep it that way. She remains hopeful but realistic. I shared my conversation and recommendations to continue conservative care and she  agrees that Mr. Schwartz would not want escalation to aggressive/invasive measures if worsening health. We discussed time needed to see where we end up. She has noted poor recall and periods of confusion at times as well - she knows that his baseline will be expected to be much different from before.   All questions/concerns addressed. Emotional support provided.   Exam: Alert, oriented to person/place, not to situation. Poor recall. Even in same conversation he began to repeat himself. No distress. Breathing regular, unlabored. Abd soft. Generalized weakness.   Plan: - DNR/DNI - Continue conservative medical management - Avoid escalation to invasive/aggressive measures (such as HD) - Time for outcomes - Hopeful for rehab  35 min  Yong Channel, NP Palliative Medicine Team Pager [phone] (Please see [email or web address] for schedule) Team Phone [phone]

## 2023-11-24 NOTE — Care Management Important Message (Addendum)
Important Message  Patient Details  Name: Steve Hickman MRN: [id number] Date of Birth: June 30, 1941   Important Message Given:  Yes - Medicare IM Due to the patient illness he could not sign copy left at patient bedside.       11/24/2023, 2:23 PM

## 2023-11-25 DIAGNOSIS — R4182 Altered mental status, unspecified: Secondary | ICD-10-CM | POA: Diagnosis not present

## 2023-11-25 LAB — BASIC METABOLIC PANEL
Anion gap: 10 (ref 5–15)
BUN: 29 mg/dL — ABNORMAL HIGH (ref 8–23)
CO2: 21 mmol/L — ABNORMAL LOW (ref 22–32)
Calcium: 8 mg/dL — ABNORMAL LOW (ref 8.9–10.3)
Chloride: 113 mmol/L — ABNORMAL HIGH (ref 98–111)
Creatinine, Ser: 2.03 mg/dL — ABNORMAL HIGH (ref 0.61–1.24)
GFR, Estimated: 32 mL/min — ABNORMAL LOW (ref >60)
Glucose, Bld: 157 mg/dL — ABNORMAL HIGH (ref 70–99)
Potassium: 3.4 mmol/L — ABNORMAL LOW (ref 3.5–5.1)
Sodium: 144 mmol/L (ref 135–145)

## 2023-11-25 LAB — CBC
HCT: 30.9 % — ABNORMAL LOW (ref 39.0–52.0)
Hemoglobin: 10.1 g/dL — ABNORMAL LOW (ref 13.0–17.0)
MCH: 31.7 pg (ref 26.0–34.0)
MCHC: 32.7 g/dL (ref 30.0–36.0)
MCV: 96.9 fL (ref 80.0–100.0)
Platelets: 158 10*3/uL (ref 150–400)
RBC: 3.19 MIL/uL — ABNORMAL LOW (ref 4.22–5.81)
RDW: 12.9 % (ref 11.5–15.5)
WBC: 9.5 10*3/uL (ref 4.0–10.5)
nRBC: 0 % (ref 0.0–0.2)

## 2023-11-25 LAB — PHOSPHORUS: Phosphorus: 3.1 mg/dL (ref 2.5–4.6)

## 2023-11-25 LAB — BRAIN NATRIURETIC PEPTIDE: B Natriuretic Peptide: 86 pg/mL (ref 0.0–100.0)

## 2023-11-25 LAB — MAGNESIUM: Magnesium: 1.6 mg/dL — ABNORMAL LOW (ref 1.7–2.4)

## 2023-11-25 MED ORDER — POTASSIUM CHLORIDE CRYS ER 20 MEQ PO TBCR
40.0000 meq | EXTENDED_RELEASE_TABLET | Freq: Once | ORAL | Status: AC
  Administered 2023-11-25: 40 meq via ORAL
  Filled 2023-11-25: qty 2

## 2023-11-25 MED ORDER — MAGNESIUM SULFATE 2 GM/50ML IV SOLN
2.0000 g | Freq: Once | INTRAVENOUS | Status: AC
  Administered 2023-11-25: 2 g via INTRAVENOUS
  Filled 2023-11-25: qty 50

## 2023-11-25 NOTE — NC FL2 (Addendum)
[place] MEDICAID [facility] LEVEL OF CARE FORM     IDENTIFICATION  Patient Name: Steve Hickman Birthdate: 10-Mar-1941 Sex: male Admission Date (Current Location): 11/16/2023  [facility] and [place] Number:  Chiropodist and Address:  [facility], [address], [place], [place] [address]      Provider Number: [id number]  Attending Physician Name and Address:  Briant Cedar, MD  Relative Name and Phone Number:       Current Level of Care: Hospital Recommended Level of Care: Skilled Nursing Facility Prior Approval Number:    Date Approved/Denied:   PASRR Number: [id number]  Discharge Plan: SNF    Current Diagnoses: Patient Active Problem List   Diagnosis Date Noted   Change in mental status 11/17/2023   NSTEMI (non-ST elevated myocardial infarction) (HCC) 11/17/2023   Cerebrovascular accident (CVA) (HCC) 11/17/2023   Demand ischemia (HCC) 11/17/2023   Encephalopathy 11/17/2023   Persistent atrial fibrillation (HCC) 11/17/2023   Acute ischemic stroke (HCC) 11/17/2023   Essential hypertension 07/14/2023   Coronary artery disease with hx of myocardial infarct w/o hx of CABG 05/25/2019   Dyslipidemia 05/25/2019   CAD S/P percutaneous coronary angioplasty 01/26/2017   Paroxysmal a-fib - post-op CABBG 07/11/2013   Diabetes mellitus (HCC) 05/31/2013   Dyslipidemia, goal LDL below 70 05/31/2013   History of ST elevation myocardial infarction (STEMI) 05/29/2013   Hx of CABG 05/29/2013   LBBB (left bundle branch block) 05/29/2013    Orientation RESPIRATION BLADDER Height & Weight     Situation, Time, Self, Place  Normal Incontinent Weight: 141 lb 5 oz (64.1 kg) Height:  5\' 5"  (165.1 cm)  BEHAVIORAL SYMPTOMS/MOOD NEUROLOGICAL BOWEL NUTRITION STATUS      Incontinent Diet (DYS 1)  AMBULATORY STATUS COMMUNICATION OF NEEDS Skin   Limited Assist Verbally Normal                       Personal Care Assistance Level of Assistance   Bathing, Feeding, Dressing Bathing Assistance: Limited assistance Feeding assistance: Limited assistance Dressing Assistance: Maximum assistance     Functional Limitations Info             SPECIAL CARE FACTORS FREQUENCY  PT (By licensed PT), OT (By licensed OT)     PT Frequency: 5x/week OT Frequency: 5x/week            Contractures      Additional Factors Info  Code Status, Allergies Code Status Info: DNR Allergies Info: NKA           Current Medications (11/25/2023):  This is the current hospital active medication list Current Facility-Administered Medications  Medication Dose Route Frequency Provider Last Rate Last Admin   acetaminophen (TYLENOL) tablet 650 mg  650 mg Oral Q4H PRN Lurline Del, MD       Or   acetaminophen (TYLENOL) 160 MG/5ML solution 650 mg  650 mg Per Tube Q4H PRN Lurline Del, MD       Or   acetaminophen (TYLENOL) suppository 650 mg  650 mg Rectal Q4H PRN Lurline Del, MD   650 mg at 11/17/23 1133   antiseptic oral rinse (BIOTENE) solution 15 mL  15 mL Topical PRN Ulice Bold, NP       aspirin EC tablet 81 mg  81 mg Oral Daily Rolly Salter, MD   81 mg at 11/25/23 0855   atorvastatin (LIPITOR) tablet 40 mg  40 mg Oral Daily  Rolly Salter, MD   40 mg at 11/25/23 9147   B-complex with vitamin C tablet 1 tablet  1 tablet Oral Daily Rolly Salter, MD   1 tablet at 11/25/23 0855   clopidogrel (PLAVIX) tablet 75 mg  75 mg Oral Daily Rolly Salter, MD   75 mg at 11/25/23 0855   cyanocobalamin (VITAMIN B12) injection 1,000 mcg  1,000 mcg Subcutaneous Q0600 Rolly Salter, MD   1,000 mcg at 11/25/23 0554   docusate sodium (COLACE) capsule 100 mg  100 mg Oral BID Rolly Salter, MD   100 mg at 11/25/23 0855   feeding supplement (ENSURE ENLIVE / ENSURE PLUS) liquid 237 mL  237 mL Oral BID BM Rolly Salter, MD   237 mL at 11/25/23 0855   haloperidol lactate (HALDOL) injection 1 mg  1 mg Intravenous Q6H PRN Rolly Salter, MD       heparin injection 5,000 Units  5,000 Units Subcutaneous Q8H Rolly Salter, MD   5,000 Units at 11/25/23 0554   magnesium sulfate IVPB 2 g 50 mL  2 g Intravenous Once Briant Cedar, MD 50 mL/hr at 11/25/23 1117 2 g at 11/25/23 1117   OLANZapine zydis (ZYPREXA) disintegrating tablet 2.5 mg  2.5 mg Oral QHS Sherlean Foot B, NP   2.5 mg at 11/24/23 2117   pantoprazole (PROTONIX) EC tablet 40 mg  40 mg Oral Daily Rolly Salter, MD   40 mg at 11/25/23 0855   polyethylene glycol (MIRALAX / GLYCOLAX) packet 17 g  17 g Oral Daily PRN Howerter, Justin B, DO       polyvinyl alcohol (LIQUIFILM TEARS) 1.4 % ophthalmic solution 1 drop  1 drop Both Eyes QID PRN Ulice Bold, NP       tamsulosin (FLOMAX) capsule 0.4 mg  0.4 mg Oral Daily Rolly Salter, MD   0.4 mg at 11/25/23 8295     Discharge Medications: Please see discharge summary for a list of discharge medications.  Relevant Imaging Results:  Relevant Lab Results:   Additional Information SSN: [id number]  Antion Felipa Emory, Student-Social Work

## 2023-11-25 NOTE — Progress Notes (Signed)
[facility] Progress Note Patient: Steve Hickman ZOX:[id number] DOB: [birth year] DOA: 11/16/2023  DOS: the patient was seen and examined on 11/25/2023   Brief Hospital Course: PMH of CAD s/p CABG x 2 (LIMA-LAD, SV-PDA) 07/07/13 (EF 60%), DM, Dyslipidemia, LBBB and prior STEMI who was BIB [facility] from home after ex wife called for AMS. Found to have stroke, sepsis and AKI as well as elevated troponin. Initially was transition to comfort care. On 1/25, family wants to transition back out of comfort care measures.  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 Treat underlying causes.  Severe hypernatremia From poor p.o. intake. Treated with D5.   Hypokalemia/hypomagnesemia Replaced  Severe B12 deficiency Being replaced with subcutaneous injection. Also treat with B complex vitamins.  Presumed Sepsis due to bacterial cause, ruled out Patient presented with elevated wbc but bradycardia along with lactic acidosis However all of these appear to be secondary to profound dehydration leading to AKI and rhabdomyolysis. Procalcitonin only 0.36, borderline and unreliable in the setting of CKD. Patient was started on Zosyn and vancomycin, now stopped.   AKI on CKD stage IIIa secondary to rhabdomyolysis/metabolic acidosis/hyperkalemia/dehydration Baseline creatinine 1.4-1.6 just 2 months ago. Presented with creatinine of 2.86 Ultrasound renal negative. Nephrology was consulted.  Despite of IV fluids, CK rising. Renal function  improved significantly close to baseline.  Interestingly after receiving IV fluids and eating more food, renal function now appears to be worsening again Will monitor.   Non-STEMI. CAD SP CABG. Patient unable to provide history due to aphasia due to stroke. High sensitivity troponin elevated 1018->1138->1286> 2043> 2087 on arrival.   Seen by cardiology, out of concern of NSTEMI, started on heparin drip after clearance from neurology. Echo EF 65 to 70%.  No wall motion abnormality.  RV function is reduced. Back on aspirin Plavix and statin.   Concern for high-grade AV block/complete heart block:  Could be due to electrolyte abnormalities and AKI.  Defer to cardiology  Type 2 diabetes mellitus, without long-term insulin use, well-controlled with renal complication. On glimepiride and metformin at home.  Hemoglobin A1c 7.3   HLD Continue statin   Anemia ,Macrocytic/B12 deficiency Hemoglobin appears stable at baseline.  B12 is undetectable. Continue supplemental B12 injections.  Continue B complex as well.   Goal of care conversation Patient was admitted as full code Due to multiple comorbidities as well as active issues patient was transition to complete comfort with consultation with Palliative care  and family. Mentation currently improving and therefore family switched out of comfort care Maintain DNR. Monitor progression.  Especially renal function.  Adult failure to thrive. With B12 deficiency, low LDL suspect patient is suffering from failure to thrive. Dietitian consulted. At risk for refeeding syndrome.  Monitor.   Agitation. Started on Zyprexa.  Monitor.  Foley catheter. Inserted during the earlier admission likely secondary to presentation with AKI and critically ill. Able to void.  Bladder scan ordered.  Subjective:  Denies any new complaints  Physical Exam: General: NAD  Cardiovascular: S1, S2 present Respiratory: CTAB Abdomen: Soft, nontender, nondistended,  bowel sounds present Musculoskeletal:  No bilateral pedal edema noted Skin: Normal Psychiatry: Normal mood     Disposition: Status is: Inpatient Remains inpatient appropriate because: Monitor renal function.  Awaiting placement to CIR.  Family Communication: None at bedside Level of care: Med-Surg   Vitals:   11/25/23 0407 11/25/23 0835 11/25/23 1158 11/25/23 1510  BP: 139/87 (!) 148/77 117/66 120/70  Pulse: 93 (!) 102 97 100  Resp: 16 18 17 16   Temp: (!) 97.4 F (36.3 C) 98 F (36.7 C) 98.2 F (36.8 C) 98.1 F (36.7 C)  TempSrc: Oral Oral Oral Oral  SpO2: 97% 96% 94% 96%  Weight:      Height:         Author: Briant Cedar, MD 11/25/2023 5:29 PM

## 2023-11-25 NOTE — Plan of Care (Signed)
  Problem: Education: Goal: Knowledge of disease or condition will improve 11/25/2023 0120 by Cottie Banda, Mardelle Matte, RN Outcome: Not Progressing 11/25/2023 0120 by Cottie Banda, Mardelle Matte, RN Outcome: Not Progressing Goal: Knowledge of secondary prevention will improve (MUST DOCUMENT ALL) 11/25/2023 0120 by Cottie Banda, Mardelle Matte, RN Outcome: Not Progressing 11/25/2023 0120 by Cottie Banda, Mardelle Matte, RN Outcome: Not Progressing Goal: Knowledge of patient specific risk factors will improve (DELETE if not current risk factor) 11/25/2023 0120 by Cottie Banda, Mardelle Matte, RN Outcome: Not Progressing 11/25/2023 0120 by Cottie Banda, Mardelle Matte, RN Outcome: Not Progressing

## 2023-11-25 NOTE — Progress Notes (Signed)
Inpatient Rehab Admissions Coordinator:   I spoke with pt.'s ex wife and she states that she is not able to manage Pt.'s care after CIR. Pt. Will need SNF. CIR will sign off. TOC notified.   Megan Salon, MS, CCC-SLP Rehab Admissions Coordinator  [phone] (celll) [phone] (office)

## 2023-11-25 NOTE — TOC Progression Note (Signed)
Transition of Care [facility]) - Progression Note    Patient Details  Name: Steve Hickman MRN: [id number] Date of Birth: 1940-12-29  [facility] CM/SW Contact  Derel Mcglasson Felipa Emory, Student-Social Work Phone Number: 11/25/2023, 3:15 PM  Clinical Narrative:   MSW Student and CSW spoke to spouse Rubin Payor about placement at [facility]. Rubin Payor indicated that she wanted to choose [facility]. MSW Student requested CMA to initiate insurance authorization.    Expected Discharge Plan: Skilled Nursing Facility Barriers to Discharge: Continued Medical Work up, English as a second language teacher  Expected Discharge Plan and Services   Discharge Planning Services: CM Consult Post Acute Care Choice: Hospice                                         Social Determinants of Health (SDOH) Interventions SDOH Screenings   Food Insecurity: Low Risk  (11/10/2023)   Received from [facility]  Housing: Low Risk  (11/10/2023)   Received from [facility]  Transportation Needs: No Transportation Needs (11/10/2023)   Received from [facility]  Utilities: Low Risk  (11/10/2023)   Received from [facility]  Social Connections: Unknown (03/11/2022)   Received from [facility]  Tobacco Use: Low Risk  (11/10/2023)   Received from [facility]    Readmission Risk Interventions     No data to display

## 2023-11-25 NOTE — Plan of Care (Signed)
  Problem: Education: Goal: Knowledge of disease or condition will improve Outcome: Progressing   Problem: Education: Goal: Knowledge of General Education information will improve Description: Including pain rating scale, medication(s)/side effects and non-pharmacologic comfort measures Outcome: Progressing   Problem: Nutrition: Goal: Adequate nutrition will be maintained Outcome: Progressing   Problem: Pain Managment: Goal: General experience of comfort will improve and/or be controlled Outcome: Progressing

## 2023-11-25 NOTE — Progress Notes (Signed)
Speech Language Pathology Treatment: Dysphagia  Patient Details Name: Steve Hickman MRN: [id number] DOB: 03-26-1941 Today's Date: 11/25/2023 Time: 1000-1020 SLP Time Calculation (min) (ACUTE ONLY): 20 min  Assessment / Plan / Recommendation Clinical Impression  Pt was seen to trial upgraded POs to determine new diet recommendations. PO trials administered were pudding thick (yogurt), thins, mech soft (diced peaches), solid, and mixed consistency, with emphasis on solid tolerance. Pt exhibited mild oral residue with only the solid consistency. Compared to previous session, pt mastication with solids was prolonged yet more thorough with bolus formation; pt also displaying improved oral clearance. It is suspected that pt's improved cognitive status during session impacted improved oral clearance. Mild solid residue was well managed with pt drinking water after initial swallow. A peanut butter cracker was introduced to increase the challenge with oral clearance of mixed consistency, with only mild residue and full oral clearance with a sip of water. SLP educated pt about the importance of taking a drink of water for oral clearance after each bite of a solid consistency. Pt can be upgraded to dysphagia 3 due to improvement with oral clearance and mastication. SLP f/u needed for continued monitoring of recent improvement with swallowing management.   HPI HPI: Pt admitted by [facility] from home after ex wife called for AMS . Found to have stroke, sepsis and AKI as well as elevated troponin. Initially was transitioned to comfort care. 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 On 1/25 family wants to transition back out of comfort care measures.  Goal of care is currently to treat what is treatable.  As the patient has significant improvement in his mentation. PMH of CAD s/p CABG x 2 (LIMA-LAD, SV-PDA) 07/07/13 (EF 60%), DM, Dyslipidemia, LBBB and  prior STEMI      SLP Plan  Continue with current plan of care      Recommendations for follow up therapy are one component of a multi-disciplinary discharge planning process, led by the attending physician.  Recommendations may be updated based on patient status, additional functional criteria and insurance authorization.    Recommendations  Diet recommendations: Dysphagia 3 (mechanical soft);Thin liquid Liquids provided via: Straw Medication Administration: Whole meds with puree Supervision: Patient able to self feed Compensations: Small sips/bites;Follow solids with liquid;Minimize environmental distractions;Slow rate Postural Changes and/or Swallow Maneuvers: Seated upright 90 degrees                  Oral care BID   Intermittent Supervision/Assistance Dysphagia, oral phase (R13.11)     Continue with current plan of care     Rowe Robert  11/25/2023, 11:15 AM

## 2023-11-26 DIAGNOSIS — R4182 Altered mental status, unspecified: Secondary | ICD-10-CM | POA: Diagnosis not present

## 2023-11-26 LAB — CBC
HCT: 32.2 % — ABNORMAL LOW (ref 39.0–52.0)
Hemoglobin: 10.3 g/dL — ABNORMAL LOW (ref 13.0–17.0)
MCH: 31.1 pg (ref 26.0–34.0)
MCHC: 32 g/dL (ref 30.0–36.0)
MCV: 97.3 fL (ref 80.0–100.0)
Platelets: 189 10*3/uL (ref 150–400)
RBC: 3.31 MIL/uL — ABNORMAL LOW (ref 4.22–5.81)
RDW: 13 % (ref 11.5–15.5)
WBC: 9.9 10*3/uL (ref 4.0–10.5)
nRBC: 0 % (ref 0.0–0.2)

## 2023-11-26 LAB — BASIC METABOLIC PANEL
Anion gap: 8 (ref 5–15)
BUN: 26 mg/dL — ABNORMAL HIGH (ref 8–23)
CO2: 21 mmol/L — ABNORMAL LOW (ref 22–32)
Calcium: 8.3 mg/dL — ABNORMAL LOW (ref 8.9–10.3)
Chloride: 115 mmol/L — ABNORMAL HIGH (ref 98–111)
Creatinine, Ser: 1.77 mg/dL — ABNORMAL HIGH (ref 0.61–1.24)
GFR, Estimated: 37 mL/min — ABNORMAL LOW (ref >60)
Glucose, Bld: 169 mg/dL — ABNORMAL HIGH (ref 70–99)
Potassium: 4.5 mmol/L (ref 3.5–5.1)
Sodium: 144 mmol/L (ref 135–145)

## 2023-11-26 LAB — PHOSPHORUS: Phosphorus: 3.2 mg/dL (ref 2.5–4.6)

## 2023-11-26 LAB — MAGNESIUM: Magnesium: 2.1 mg/dL (ref 1.7–2.4)

## 2023-11-26 MED ORDER — ORAL CARE MOUTH RINSE: 15.0000 mL | OROMUCOSAL | Status: DC | PRN

## 2023-11-26 MED ORDER — POLYETHYLENE GLYCOL 3350 17 G PO PACK
17.0000 g | PACK | Freq: Two times a day (BID) | ORAL | Status: DC
  Administered 2023-11-26 – 2023-11-27 (×2): 17 g via ORAL
  Filled 2023-11-26 (×2): qty 1

## 2023-11-26 MED ORDER — SENNOSIDES-DOCUSATE SODIUM 8.6-50 MG PO TABS
1.0000 | ORAL_TABLET | Freq: Two times a day (BID) | ORAL | Status: DC
  Administered 2023-11-26 – 2023-11-27 (×2): 1 via ORAL
  Filled 2023-11-26 (×2): qty 1

## 2023-11-26 NOTE — Progress Notes (Signed)
Physical Therapy Treatment Patient Details Name: Steve Hickman MRN: [id number] DOB: 22-Jan-1941 Today's Date: 11/26/2023   History of Present Illness Pt is 83 yo male who presents on 11/16/23 with AMS, found down, garbled speech, unable to use R side. 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  Possible NSTEMI as well. PMH: CAD s/p CABG, DM, LBBB, STEMI    PT Comments  Pt resting in bed on arrival and agreeable to session with continues progress towards acute goals. Pt able to come to sitting EOB with CGA for safety and transfer to stand with CGA for safety and cues for hand placement. Pt able to progress gait this session with min A to steady and manage RW, especially with turning, as pt with low shuffling steps placing pt at high risk for falls. Pt continues to be limited by decreased activity tolerance, impaired balance/postural reactions and global weakness. Current plan remains appropriate to address deficits and maximize functional independence and decrease caregiver burden. Pt continues to benefit from skilled PT services to progress toward functional mobility goals.      If plan is discharge home, recommend the following: A little help with walking and/or transfers;A little help with bathing/dressing/bathroom;Assistance with cooking/housework;Direct supervision/assist for medications management;Direct supervision/assist for financial management;Assist for transportation;Help with stairs or ramp for entrance;Supervision due to cognitive status   Can travel by private vehicle        Equipment Recommendations  Rollator (4 wheels)    Recommendations for Other Services       Precautions / Restrictions Precautions Precautions: Fall Precaution Comments: watch BP Restrictions Weight Bearing Restrictions Per Provider Order: No     Mobility  Bed Mobility Overal bed mobility: Needs Assistance Bed Mobility: Supine to Sit, Sit to Supine     Supine to  sit: Contact guard Sit to supine: Supervision   General bed mobility comments: CGA with increased time and cues to scoot out to EOB, supervision to return to supine    Transfers Overall transfer level: Needs assistance Equipment used: Rolling walker (2 wheels) Transfers: Sit to/from Stand Sit to Stand: Contact guard assist           General transfer comment: CGA to come to stand x3 during session with cues for hand placement. mild dizziness intiailly however resolving with increased time in standing.    Ambulation/Gait Ambulation/Gait assistance: Min assist Gait Distance (Feet): 30 Feet Assistive device: Rolling walker (2 wheels) Gait Pattern/deviations: Step-through pattern, Decreased stride length, Shuffle, Trunk flexed Gait velocity: decr     General Gait Details: short shuffling steps, able to increased amplitude with cues, flexed trunk throughout, able to correct in static standing but unable during gait   Stairs             Wheelchair Mobility     Tilt Bed    Modified Rankin (Stroke Patients Only) Modified Rankin (Stroke Patients Only) Pre-Morbid Rankin Score: No symptoms Modified Rankin: Moderately severe disability     Balance Overall balance assessment: Needs assistance Sitting-balance support: Feet supported Sitting balance-Leahy Scale: Fair     Standing balance support: Bilateral upper extremity supported, During functional activity Standing balance-Leahy Scale: Poor Standing balance comment: reliant on UE support                            Cognition Arousal: Alert Behavior During Therapy: WFL for tasks assessed/performed Overall Cognitive Status: No family/caregiver present to determine baseline cognitive  functioning                                 General Comments: following all simple commands well, pleasant and conversational        Exercises General Exercises - Lower Extremity Hip Flexion/Marching: AROM,  Right, Left, 10 reps, Seated    General Comments General comments (skin integrity, edema, etc.): BP supine 120/79, sitting EOB 130/83, standing 116/74, pt with mild c/o dizziness on initial stand, resolving with increased time in standing      Pertinent Vitals/Pain Pain Assessment Pain Assessment: No/denies pain Pain Intervention(s): Monitored during session    Home Living                          Prior Function            PT Goals (current goals can now be found in the care plan section) Acute Rehab PT Goals Patient Stated Goal: get stronger and go home PT Goal Formulation: With patient Time For Goal Achievement: 12/07/23 Progress towards PT goals: Progressing toward goals    Frequency    Min 1X/week      PT Plan      Co-evaluation              AM-PAC PT "6 Clicks" Mobility   Outcome Measure  Help needed turning from your back to your side while in a flat bed without using bedrails?: None Help needed moving from lying on your back to sitting on the side of a flat bed without using bedrails?: None Help needed moving to and from a bed to a chair (including a wheelchair)?: A Little Help needed standing up from a chair using your arms (e.g., wheelchair or bedside chair)?: A Little Help needed to walk in hospital room?: A Lot (<40') Help needed climbing 3-5 steps with a railing? : Total 6 Click Score: 17    End of Session Equipment Utilized During Treatment: Gait belt Activity Tolerance: Patient tolerated treatment well Patient left: with call bell/phone within reach;in bed;with bed alarm set Nurse Communication: Mobility status PT Visit Diagnosis: Unsteadiness on feet (R26.81);Muscle weakness (generalized) (M62.81)     Time: 1610-9604 PT Time Calculation (min) (ACUTE ONLY): 23 min  Charges:    $Therapeutic Activity: 23-37 mins PT General Charges $$ ACUTE PT VISIT: 1 Visit                     Donnalee Cellucci R. PTA Acute Rehabilitation  Services Office: [phone]   Catalina Antigua 11/26/2023, 1:03 PM

## 2023-11-26 NOTE — Plan of Care (Signed)
  Problem: Education: Goal: Knowledge of disease or condition will improve Outcome: Progressing Goal: Knowledge of secondary prevention will improve (MUST DOCUMENT ALL) Outcome: Progressing Goal: Knowledge of patient specific risk factors will improve (DELETE if not current risk factor) Outcome: Progressing   Problem: Education: Goal: Knowledge of General Education information will improve Description: Including pain rating scale, medication(s)/side effects and non-pharmacologic comfort measures Outcome: Progressing   Problem: Health Behavior/Discharge Planning: Goal: Ability to manage health-related needs will improve Outcome: Progressing   Problem: Clinical Measurements: Goal: Ability to maintain clinical measurements within normal limits will improve Outcome: Progressing Goal: Will remain free from infection Outcome: Progressing Goal: Diagnostic test results will improve Outcome: Progressing Goal: Respiratory complications will improve Outcome: Progressing Goal: Cardiovascular complication will be avoided Outcome: Progressing   Problem: Activity: Goal: Risk for activity intolerance will decrease Outcome: Progressing   Problem: Nutrition: Goal: Adequate nutrition will be maintained Outcome: Progressing   Problem: Coping: Goal: Level of anxiety will decrease Outcome: Progressing   Problem: Elimination: Goal: Will not experience complications related to bowel motility Outcome: Progressing Goal: Will not experience complications related to urinary retention Outcome: Progressing   Problem: Pain Managment: Goal: General experience of comfort will improve and/or be controlled Outcome: Progressing   Problem: Safety: Goal: Ability to remain free from injury will improve Outcome: Progressing   Problem: Skin Integrity: Goal: Risk for impaired skin integrity will decrease Outcome: Progressing

## 2023-11-26 NOTE — Progress Notes (Signed)
 Speech Language Pathology Treatment: Dysphagia  Patient Details Name: Steve Hickman MRN: [id number] DOB: 1941/04/08 Today's Date: 11/26/2023 Time: 4098-1191 SLP Time Calculation (min) (ACUTE ONLY): 20 min  Assessment / Plan / Recommendation Clinical Impression  Pt was seen to observe full meal to practice compensatory strategies and assess for any new diet recommendations. Pt presented with slightly increased coughing compared to previous session across PO trials observed. Coughing was delayed and was described as unrelated to food or liquid consumed according to pt. Pt noted that his coughing occurs outside of meal time too and is related to a tickle sensation in throat. Pt exhibited prolonged mastication when eating cobbler. SLP reviewed taking sips after bites with pt and pt displayed understanding by taking sips of water after a few bites of cobbler 2x independently during the session. SLP educated pt about the importance of hydration for supporting swallowing and oral clearance. Recommend another SLP f/u to determine if pt is still tolerating dysphagia 3 diet and the possible transition to dysphagia 2.   HPI HPI: Pt admitted by [facility] from home after ex wife called for AMS . Found to have stroke, sepsis and AKI as well as elevated troponin. Initially was transitioned to comfort care. 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 On 1/25 family wants to transition back out of comfort care measures.  Goal of care is currently to treat what is treatable.  As the patient has significant improvement in his mentation. 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  Recommendations may be  updated based on patient status, additional functional criteria and insurance authorization.    Recommendations  Liquids provided via: Straw Compensations: Small sips/bites;Follow solids with liquid Postural Changes and/or Swallow Maneuvers: Seated upright 90 degrees                  Oral care BID   Intermittent Supervision/Assistance Dysphagia, oral phase (R13.11)     Continue with current plan of care     Rowe Robert  11/26/2023, 2:17 PM

## 2023-11-26 NOTE — Progress Notes (Addendum)
[facility] Progress Note Patient: Steve Hickman ZOX:[id number] DOB: [birth year] DOA: 11/16/2023  DOS: the patient was seen and examined on 11/26/2023   Brief Hospital Course: PMH of CAD s/p CABG x 2 (LIMA-LAD, SV-PDA) 07/07/13 (EF 60%), DM, Dyslipidemia, LBBB and prior STEMI who was BIB [facility] from home after ex wife called for AMS. Found to have stroke, sepsis and AKI as well as elevated troponin. Initially was transition to comfort care. On 1/25, family wants to transition back out of comfort care measures.  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 Treat underlying causes.  Severe hypernatremia From poor p.o. intake. Treated with D5.   Hypokalemia/hypomagnesemia Replaced  Severe B12 deficiency Being replaced with subcutaneous injection. Also treat with B complex vitamins.  Presumed Sepsis due to bacterial cause, ruled out Patient presented with elevated wbc but bradycardia along with lactic acidosis However all of these appear to be secondary to profound dehydration leading to AKI and rhabdomyolysis. Procalcitonin only 0.36, borderline and unreliable in the setting of CKD. Patient was started on Zosyn and vancomycin, now stopped.   AKI on CKD stage IIIa secondary to rhabdomyolysis/metabolic acidosis/hyperkalemia/dehydration Baseline creatinine 1.4-1.6 just 2 months ago. Presented with creatinine of 2.86 Ultrasound renal negative   Non-STEMI CAD SP CABG Patient unable to provide history due to aphasia due to  stroke. High sensitivity troponin elevated 1018->1138->1286> 2043> 2087 on arrival.   Seen by cardiology, out of concern of NSTEMI, started on heparin drip after clearance from neurology. Echo EF 65 to 70%.  No wall motion abnormality.  RV function is reduced. Back on aspirin Plavix and statin.   Concern for high-grade AV block/complete heart block:  Could be due to electrolyte abnormalities and AKI.  Defer to cardiology  Type 2 diabetes mellitus, without long-term insulin use, well-controlled with renal complication. On glimepiride and metformin at home.  Hemoglobin A1c 7.3   HLD Continue statin   Anemia ,Macrocytic/B12 deficiency Hemoglobin appears stable at baseline.  B12 is undetectable. Continue supplemental B12 injections.  Continue B complex as well.   Goal of care conversation Due to multiple comorbidities as well as active issues patient was transition to complete comfort with consultation with Palliative care  and family. Mentation currently improving and therefore family switched out of comfort care Maintain DNR  Adult failure to thrive Dietitian consulted  Agitation. Started on Zyprexa.  Monitor.   Subjective:  Reports some constipation and some minor bleeding per rectum while straining to have a BM. Bowel regimen ordered  Physical Exam: General: NAD  Cardiovascular: S1, S2 present Respiratory: CTAB Abdomen: Soft, nontender, nondistended, bowel sounds present Musculoskeletal: No bilateral pedal edema noted Skin: Normal Psychiatry: Normal mood     Disposition: Status is: Inpatient Remains inpatient appropriate because: Monitor renal function.  Awaiting placement to CIR.  Family Communication: None at bedside Level of care: Med-Surg   Vitals:   11/26/23 0005 11/26/23 0320 11/26/23 1105 11/26/23 1524  BP: 135/69 135/83 108/68 128/74  Pulse: 82 91 95 83  Resp: 19 18 18 16   Temp: 98.3 F (36.8 C) (!) 97.5 F (  36.4 C) 97.8 F (36.6 C) (!) 97.5 F (36.4  C)  TempSrc: Oral Oral Oral Oral  SpO2: 97% 96% 96% 97%  Weight:      Height:         Author: Briant Cedar, MD 11/26/2023 5:38 PM

## 2023-11-26 NOTE — TOC Progression Note (Signed)
Transition of Care [facility]) - Progression Note    Patient Details  Name: Steve Hickman MRN: [id number] Date of Birth: 09-Jan-1941  Transition of Care [facility]) CM/SW Contact  Baldemar Lenis, [place] Phone Number: 11/26/2023, 4:23 PM  Clinical Narrative:   CSW following for discharge to SNF. Insurance requested updated PT note, CSW coordinated with PT assigned and sent updated PT note in for authorization request. Authorization is pending at this time. CSW to follow.    Expected Discharge Plan: Skilled Nursing Facility Barriers to Discharge: Continued Medical Work up, English as a second language teacher  Expected Discharge Plan and Services   Discharge Planning Services: CM Consult Post Acute Care Choice: Hospice                                         Social Determinants of Health (SDOH) Interventions SDOH Screenings   Food Insecurity: Low Risk  (11/10/2023)   Received from [facility]  Housing: Low Risk  (11/10/2023)   Received from [facility]  Transportation Needs: No Transportation Needs (11/10/2023)   Received from [facility]  Utilities: Low Risk  (11/10/2023)   Received from [facility]  Social Connections: Unknown (03/11/2022)   Received from [facility]  Tobacco Use: Low Risk  (11/10/2023)   Received from [facility]    Readmission Risk Interventions     No data to display

## 2023-11-26 NOTE — Plan of Care (Signed)
  Problem: Activity: Goal: Risk for activity intolerance will decrease Outcome: Progressing   Problem: Nutrition: Goal: Adequate nutrition will be maintained Outcome: Progressing   Problem: Coping: Goal: Level of anxiety will decrease Outcome: Progressing   Problem: Elimination: Goal: Will not experience complications related to urinary retention Outcome: Progressing   Problem: Pain Managment: Goal: General experience of comfort will improve and/or be controlled Outcome: Progressing   Problem: Safety: Goal: Ability to remain free from injury will improve Outcome: Progressing

## 2023-11-27 DIAGNOSIS — R4182 Altered mental status, unspecified: Secondary | ICD-10-CM | POA: Diagnosis not present

## 2023-11-27 MED ORDER — ENSURE ENLIVE PO LIQD: 237.0000 mL | Freq: Two times a day (BID) | ORAL | Status: AC

## 2023-11-27 MED ORDER — SENNOSIDES-DOCUSATE SODIUM 8.6-50 MG PO TABS: 1.0000 | ORAL_TABLET | Freq: Every day | ORAL | Status: AC

## 2023-11-27 MED ORDER — POLYETHYLENE GLYCOL 3350 17 G PO PACK: 17.0000 g | PACK | Freq: Every day | ORAL | Status: AC

## 2023-11-27 MED ORDER — TAMSULOSIN HCL 0.4 MG PO CAPS: 0.4000 mg | ORAL_CAPSULE | Freq: Every day | ORAL | Status: AC

## 2023-11-27 MED ORDER — OLANZAPINE 5 MG PO TBDP: 2.5000 mg | ORAL_TABLET | Freq: Every evening | ORAL | Status: AC | PRN

## 2023-11-27 MED ORDER — CLOPIDOGREL BISULFATE 75 MG PO TABS: 75.0000 mg | ORAL_TABLET | Freq: Every day | ORAL | Status: DC

## 2023-11-27 MED ORDER — VITAMIN B-12 1000 MCG PO TABS: 1000.0000 ug | ORAL_TABLET | Freq: Every day | ORAL | Status: AC

## 2023-11-27 NOTE — Progress Notes (Addendum)
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 Found to have stroke, sepsis and AKI as well as elevated troponin. Initially was transition to comfort care. On 1/25, family wants to transition back out of comfort care measures.  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 Treat underlying causes.  Severe hypernatremia From poor p.o. intake. Treated with D5.   Hypokalemia/hypomagnesemia Replaced  Severe B12 deficiency Being replaced with subcutaneous injection. Also treat with B complex vitamins.  Presumed Sepsis due to bacterial cause, ruled out Patient presented with elevated wbc but bradycardia along with lactic acidosis However all of these appear to be secondary to profound dehydration leading to AKI and rhabdomyolysis. Procalcitonin only 0.36, borderline and unreliable in the setting of CKD. Patient was started on Zosyn and vancomycin, now stopped.   AKI on CKD stage IIIa secondary to rhabdomyolysis/metabolic acidosis/hyperkalemia/dehydration Baseline creatinine 1.4-1.6 just 2 months ago. Presented with creatinine of 2.86 Ultrasound renal negative   Non-STEMI CAD SP CABG Patient unable to provide history due to aphasia due to  stroke. High sensitivity troponin elevated 1018->1138->1286> 2043> 2087 on arrival.   Seen by cardiology, out of concern of NSTEMI, started on heparin drip after clearance from neurology. Echo EF 65 to 70%.  No wall motion abnormality.  RV function is reduced. On aspirin Plavix and statin.   Concern for high-grade AV block/complete heart block:  Could be due to electrolyte abnormalities and AKI.  Defer to cardiology  Type 2 diabetes mellitus, without long-term insulin use, well-controlled with renal complication. On glimepiride and metformin at home.  Hemoglobin A1c 7.3   HLD Continue statin   Anemia ,Macrocytic/B12 deficiency Hemoglobin appears stable at baseline.  B12 is undetectable. Continue supplemental B12 injections.  Continue B complex as well.   Goal of care conversation Due to multiple comorbidities as well as active issues patient was transition to complete comfort with consultation with Palliative care  and family. Mentation currently improving and therefore family switched out of comfort care Maintain DNR  Adult failure to thrive Dietitian consulted  Agitation. Started on Zyprexa.  Monitor.   Subjective:  Denies any new complaints, still with no BM  Physical Exam: General: NAD  Cardiovascular: S1, S2 present Respiratory: CTAB Abdomen: Soft, nontender, nondistended, bowel sounds present Musculoskeletal: No bilateral pedal edema noted Skin: Normal Psychiatry: Normal mood     Disposition: Status is: Inpatient Remains inpatient appropriate because: Monitor renal function.  Awaiting placement to SNF  Family Communication: None at bedside Level of care: Med-Surg   Vitals:   11/26/23 2015 11/27/23 0015 11/27/23 0453 11/27/23 0825  BP: (!) 146/79 133/81 137/72 106/68  Pulse: 90 93 85 91  Resp: 20   16  Temp: 99.1 F (37.3 C) 98.6 F (37 C) 98.6 F (37 C) 98.4 F (36.9 C)  TempSrc: Axillary Axillary Axillary Oral  SpO2: 97% 96% 96% 95%  Weight:       Height:         Author: Briant Cedar, MD 11/27/2023 10:48 AM

## 2023-11-27 NOTE — Progress Notes (Signed)
Speech Language Pathology Treatment: Dysphagia  Patient Details Name: Steve Hickman MRN: [id number] DOB: Jan 25, 1941 Today's Date: 11/27/2023 Time: 4098-1191 SLP Time Calculation (min) (ACUTE ONLY): 12 min  Assessment / Plan / Recommendation Clinical Impression  Pt was seen for skilled ST targeting diet tolerance.  He was encountered awake/alert in bed with NA at bedside.  Pt and RN reported that pt tolerated breakfast meal tray this AM without difficulty.  Pt consumed trials of regular solids and thin liquids via straw.  He exhibited mildly prolonged, but effective mastication of solids and independently took sips of water following solid intake.  No overt s/sx of aspiration were observed with PO intake on this date.  Recommend brief f/u during meal to fully assess diet tolerance.  Continue with Dysphagia 3 (soft) solids and thin liquids with strict adherence to aspiration precautions.     HPI HPI: Pt admitted by [facility] from home after ex wife called for AMS . Found to have stroke, sepsis and AKI as well as elevated troponin. Initially was transitioned to comfort care. 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 On 1/25 family wants to transition back out of comfort care measures.  Goal of care is currently to treat what is treatable.  As the patient has significant improvement in his mentation. 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  Recommendations may be updated based on patient status, additional functional criteria and insurance authorization.    Recommendations  Diet recommendations: Dysphagia 3 (mechanical soft);Thin liquid Liquids provided via: Straw Medication  Administration: Whole meds with puree Supervision: Patient able to self feed Compensations: Small sips/bites;Follow solids with liquid Postural Changes and/or Swallow Maneuvers: Seated upright 90 degrees                  Oral care BID   Intermittent Supervision/Assistance Dysphagia, oral phase (R13.11)     Continue with current plan of care    Eino Farber, M.S., CCC-SLP Acute Rehabilitation Services Office: [phone]  Shanon Rosser [facility]  11/27/2023, 10:28 AM

## 2023-11-27 NOTE — TOC Transition Note (Signed)
Transition of Care [facility]) - Discharge Note   Patient Details  Name: Steve Hickman MRN: [id number] Date of Birth: [birth year]  [facility] CM/SW Contact:  Parrish Daddario Felipa [place], Student-Social Work Phone Number: 11/27/2023, 2:45 PM   Clinical Narrative:   MSW Student received inurance approval for patient to admit to [facility] and Rehab. MSW Student confirmed with MD that patient stable for discharge. MSW student notified spouse Rubin Payor and they are in agreement with discharge. MSW student confirmed bed is available at [facility]. Transport arranged with [facility] for next available.   Number to call report: [phone] RM: [phone]      Final next level of care: Skilled Nursing Facility Barriers to Discharge: Barriers Resolved   Patient Goals and CMS Choice   CMS Medicare.gov Compare Post Acute Care list provided to:: Patient Represenative (must comment) Choice offered to / list presented to : Spouse      Discharge Placement              Patient chooses bed at:  [facility] and Rehab) Patient to be transferred to facility by: [facility] Name of family member notified: Rubin Payor Patient and family notified of of transfer: 11/27/23  Discharge Plan and Services Additional resources added to the After Visit Summary for     Discharge Planning Services: CM Consult Post Acute Care Choice: Hospice                               Social Drivers of Health (SDOH) Interventions SDOH Screenings   Food Insecurity: Low Risk  (11/10/2023)   Received from [facility]  Housing: Low Risk  (11/10/2023)   Received from [facility]  Transportation Needs: No Transportation Needs (11/10/2023)   Received from [facility]  Utilities: Low Risk  (11/10/2023)   Received from [facility]  Social Connections: Unknown (03/11/2022)   Received from [facility]  Tobacco Use: Low Risk  (11/10/2023)   Received from [facility]     Readmission Risk  Interventions     No data to display

## 2023-11-27 NOTE — Progress Notes (Signed)
 Patient discharged, transported by [facility]

## 2023-11-27 NOTE — Plan of Care (Signed)
  Problem: Education: Goal: Knowledge of disease or condition will improve Outcome: Adequate for Discharge Goal: Knowledge of secondary prevention will improve (MUST DOCUMENT ALL) Outcome: Adequate for Discharge Goal: Knowledge of patient specific risk factors will improve (DELETE if not current risk factor) Outcome: Adequate for Discharge   Problem: Education: Goal: Knowledge of General Education information will improve Description: Including pain rating scale, medication(s)/side effects and non-pharmacologic comfort measures Outcome: Adequate for Discharge   Problem: Health Behavior/Discharge Planning: Goal: Ability to manage health-related needs will improve Outcome: Adequate for Discharge   Problem: Clinical Measurements: Goal: Ability to maintain clinical measurements within normal limits will improve Outcome: Adequate for Discharge Goal: Will remain free from infection Outcome: Adequate for Discharge Goal: Diagnostic test results will improve Outcome: Adequate for Discharge Goal: Respiratory complications will improve Outcome: Adequate for Discharge Goal: Cardiovascular complication will be avoided Outcome: Adequate for Discharge   Problem: Activity: Goal: Risk for activity intolerance will decrease Outcome: Adequate for Discharge   Problem: Nutrition: Goal: Adequate nutrition will be maintained Outcome: Adequate for Discharge   Problem: Coping: Goal: Level of anxiety will decrease Outcome: Adequate for Discharge   Problem: Elimination: Goal: Will not experience complications related to bowel motility Outcome: Adequate for Discharge Goal: Will not experience complications related to urinary retention Outcome: Adequate for Discharge   Problem: Pain Managment: Goal: General experience of comfort will improve and/or be controlled Outcome: Adequate for Discharge   Problem: Safety: Goal: Ability to remain free from injury will improve Outcome: Adequate for  Discharge   Problem: Skin Integrity: Goal: Risk for impaired skin integrity will decrease Outcome: Adequate for Discharge

## 2023-11-27 NOTE — Progress Notes (Signed)
Called report to [facility] Re[facility], waiting [facility] for transfer

## 2023-11-27 NOTE — Discharge Summary (Signed)
Physician Discharge Summary   Patient: Steve Hickman MRN: [id number] DOB: 85-Nov-1942  Admit date:     11/16/2023  Discharge date: 11/27/23  Discharge Physician: Briant Cedar   PCP: Philemon Kingdom, MD   Recommendations at discharge:   Follow-up with PCP  Discharge Diagnoses: Principal Problem:   Change in mental status Active Problems:   NSTEMI (non-ST elevated myocardial infarction) (HCC)   Cerebrovascular accident (CVA) (HCC)   Demand ischemia (HCC)   Encephalopathy   Persistent atrial fibrillation (HCC)   Acute ischemic stroke [facility])    Hospital Course: PMH of CAD s/p CABG x 2 (LIMA-LAD, SV-PDA) 07/07/13 (EF 60%), DM, Dyslipidemia, LBBB and prior STEMI who was BIB [facility] EMS from home after ex wife called for AMS. Found to have stroke, sepsis and AKI as well as elevated troponin. Initially was transitioned to comfort care. On 1/25, family requests to transition back out of comfort care measures, as the patient had significant improvement in his mentation. Goal of care is currently to treat what is treatable.  Patient stable to discharge to SNF, denies any new complaints.  Assessment and Plan:  Acute ischemic stroke CT head unremarkable MRI brain confirmed 6 mm acute ischemic nonhemorrhagic deep white matter infarct involving the left frontal centrum semi ovale Neurology consulted, continue Aspirin, Plavix PT OT and SLP-recommend SNF   Acute metabolic encephalopathy Improved Likely secondary to metabolic derangements, AKI, CVA   Severe hypernatremia Resolved From poor p.o. intake Treated with D5   Hypokalemia/hypomagnesemia Replaced   Severe B12 deficiency Continue B12 supplements   Presumed Sepsis due to bacterial cause, ruled out Patient presented with elevated wbc but bradycardia along with lactic acidosis However all of these appear to be secondary to profound dehydration leading to AKI and rhabdomyolysis Procalcitonin only 0.36, borderline and  unreliable in the setting of CKD Patient was started on Zosyn and vancomycin, but was discontinued    AKI on CKD stage IIIa secondary to rhabdomyolysis/metabolic acidosis/hyperkalemia/dehydration Improved Baseline creatinine 1.4-1.6 just 2 months ago Presented with creatinine of 2.86 Ultrasound renal negative   Non-STEMI CAD SP CABG High sensitivity troponin elevated 1018->1138->1286> 2043> 2087 on arrival.   Seen by cardiology, out of concern of NSTEMI, started on heparin drip, discontinued Echo EF 65 to 70%.  No wall motion abnormality.  RV function is reduced. On aspirin Plavix and statin.   Concern for high-grade AV block/complete heart block:  Could be due to electrolyte abnormalities and AKI Follow up with cardiology    Type 2 diabetes mellitus, without long-term insulin use, well-controlled with renal complication. Continue glimepiride and metformin Hemoglobin A1c 7.3   HLD Continue statin   Anemia ,Macrocytic/B12 deficiency Hemoglobin appears stable at baseline.  B12 is undetectable. Continue supplemental B12 oral   Goal of care conversation Due to multiple comorbidities as well as active issues, patient was transitioned to complete comfort care with consultation with Palliative care  and family. As mentation improved, family switched out of comfort care Maintain DNR   Adult failure to thrive Dietitian consulted, continue supplements   Agitation Resolved Zyprexa prn QHS    Consultants: Neurology, cardiology Procedures performed: None Disposition: Skilled nursing facility Diet recommendation:  Cardiac and Carb modified diet DISCHARGE MEDICATION: Allergies as of 11/27/2023   No Known Allergies      Medication List     TAKE these medications    aspirin EC 81 MG tablet Take 1 tablet (81 mg total) by mouth daily.   atorvastatin 80 MG tablet Commonly known  as: LIPITOR TAKE 1 TABLET BY MOUTH ONCE DAILY AT 6 PM   clopidogrel 75 MG tablet Commonly  known as: PLAVIX Take 1 tablet (75 mg total) by mouth daily. Start taking on: November 28, 2023   cyanocobalamin 1000 MCG tablet Commonly known as: VITAMIN B12 Take 1 tablet (1,000 mcg total) by mouth daily.   feeding supplement Liqd Take 237 mLs by mouth 2 (two) times daily between meals.   glimepiride 2 MG tablet Commonly known as: AMARYL Take 2 mg by mouth daily with breakfast.   metFORMIN 1000 MG tablet Commonly known as: GLUCOPHAGE Take 1,000 mg by mouth 2 (two) times daily with a meal.   metoprolol tartrate 25 MG tablet Commonly known as: LOPRESSOR Take 1/2 (one-half) tablet by mouth twice daily   OLANZapine zydis 5 MG disintegrating tablet Commonly known as: ZYPREXA Take 0.5 tablets (2.5 mg total) by mouth at bedtime as needed (For agitation).   pantoprazole 40 MG tablet Commonly known as: PROTONIX Take 1 tablet (40 mg total) by mouth daily.   polyethylene glycol 17 g packet Commonly known as: MIRALAX / GLYCOLAX Take 17 g by mouth daily.   senna-docusate 8.6-50 MG tablet Commonly known as: Senokot-S Take 1 tablet by mouth at bedtime.   tamsulosin 0.4 MG Caps capsule Commonly known as: FLOMAX Take 1 capsule (0.4 mg total) by mouth daily. Start taking on: November 28, 2023        Follow-up Information     Philemon Kingdom, MD Follow up.   Specialty: Internal Medicine Contact information: [address] [place] [address] [phone]                Discharge Exam: Ceasar Mons Weights   11/17/23 0237  Weight: 64.1 kg   General: NAD, deconditioned  Cardiovascular: S1, S2 present Respiratory: CTAB Abdomen: Soft, nontender, nondistended, bowel sounds present Musculoskeletal: No bilateral pedal edema noted Skin: Normal Psychiatry: Normal mood   Condition at discharge: stable  The results of significant diagnostics from this hospitalization (including imaging, microbiology, ancillary and laboratory) are listed below for reference.   Imaging  Studies: DG Abd Portable 1V Result Date: 11/24/2023 CLINICAL DATA:  Constipation. EXAM: PORTABLE ABDOMEN - 1 VIEW COMPARISON:  Abdominal CT dated 04/16/2022. FINDINGS: Mild colonic stool burden. No bowel dilatation or evidence of obstruction. No free air or radiopaque calculi. Degenerative changes of the spine. No acute osseous pathology. IMPRESSION: Mild colonic stool burden. No bowel obstruction. Electronically Signed   By: Elgie Collard M.D.   On: 11/24/2023 14:38   ECHOCARDIOGRAM COMPLETE Result Date: 11/17/2023    ECHOCARDIOGRAM REPORT   Patient Name:   MARQUI FORMBY Date of Exam: 11/17/2023 Medical Rec #:  [id number]         Height:       65.0 in Accession #:    [id number]        Weight:       141.3 lb Date of Birth:  April 13, 1941          BSA:          1.707 m Patient Age:    82 years          BP:           170/61 mmHg Patient Gender: M                 HR:           80 bpm. Exam Location:  Inpatient Procedure: 2D Echo, Cardiac Doppler, Color Doppler  and Intracardiac            Opacification Agent Indications:    TIA  History:        Patient has prior history of Echocardiogram examinations, most                 recent 08/17/2023. CAD, Prior CABG; Risk Factors:Hypertension,                 Diabetes and Dyslipidemia.  Sonographer:    Karma Ganja Referring Phys: [id number] SARA-MAIZ A THOMAS IMPRESSIONS  1. Left ventricular ejection fraction, by estimation, is 65 to 70%. The left ventricle has normal function. The left ventricle has no regional wall motion abnormalities. There is mild concentric left ventricular hypertrophy. Left ventricular diastolic function could not be evaluated.  2. Right ventricular systolic function is mildly reduced. The right ventricular size is mildly enlarged. There is moderately elevated pulmonary artery systolic pressure.  3. Left atrial size was mildly dilated.  4. Right atrial size was mildly dilated.  5. The mitral valve is normal in structure. Mild mitral valve regurgitation.  No evidence of mitral stenosis.  6. The aortic valve is tricuspid. There is mild calcification of the aortic valve. There is mild thickening of the aortic valve. Aortic valve regurgitation is not visualized. Mild aortic valve stenosis.  7. The inferior vena cava is dilated in size with <50% respiratory variability, suggesting right atrial pressure of 15 mmHg. Comparison(s): Prior images reviewed side by side. The rhythm is now atrial fibrillation. The right heart chambers are dilated and there is evidence of high right atrial pressure and mild pulmonary hypertension. FINDINGS  Left Ventricle: Left ventricular ejection fraction, by estimation, is 65 to 70%. The left ventricle has normal function. The left ventricle has no regional wall motion abnormalities. Definity contrast agent was given IV to delineate the left ventricular  endocardial borders. The left ventricular internal cavity size was normal in size. There is mild concentric left ventricular hypertrophy. Abnormal (paradoxical) septal motion consistent with post-operative status. Left ventricular diastolic function could not be evaluated due to atrial fibrillation. Left ventricular diastolic function could not be evaluated. Right Ventricle: The right ventricular size is mildly enlarged. No increase in right ventricular wall thickness. Right ventricular systolic function is mildly reduced. There is moderately elevated pulmonary artery systolic pressure. The tricuspid regurgitant velocity is 2.89 m/s, and with an assumed right atrial pressure of 15 mmHg, the estimated right ventricular systolic pressure is 48.4 mmHg. Left Atrium: Left atrial size was mildly dilated. Right Atrium: Right atrial size was mildly dilated. Pericardium: There is no evidence of pericardial effusion. Mitral Valve: The mitral valve is normal in structure. Mild mitral valve regurgitation, with centrally-directed jet. No evidence of mitral valve stenosis. Tricuspid Valve: The tricuspid  valve is normal in structure. Tricuspid valve regurgitation is mild. Aortic Valve: The aortic valve is tricuspid. There is mild calcification of the aortic valve. There is mild thickening of the aortic valve. Aortic valve regurgitation is not visualized. Mild aortic stenosis is present. Aortic valve mean gradient measures  19.0 mmHg. Aortic valve peak gradient measures 41.5 mmHg. Aortic valve area, by VTI measures 1.50 cm. Pulmonic Valve: The pulmonic valve was grossly normal. Pulmonic valve regurgitation is not visualized. No evidence of pulmonic stenosis. Aorta: The aortic root is normal in size and structure. Venous: The inferior vena cava is dilated in size with less than 50% respiratory variability, suggesting right atrial pressure of 15 mmHg. IAS/Shunts: No atrial level shunt detected  by color flow Doppler.  LEFT VENTRICLE PLAX 2D LVIDd:         4.00 cm     Diastology LVIDs:         3.20 cm     LV e' medial:    8.92 cm/s LV PW:         1.30 cm     LV E/e' medial:  12.8 LV IVS:        1.40 cm     LV e' lateral:   10.10 cm/s LVOT diam:     2.00 cm     LV E/e' lateral: 11.3 LV SV:         69 LV SV Index:   41 LVOT Area:     3.14 cm  LV Volumes (MOD) LV vol d, MOD A2C: 49.5 ml LV vol d, MOD A4C: 56.7 ml LV vol s, MOD A2C: 35.6 ml LV vol s, MOD A4C: 28.0 ml LV SV MOD A2C:     13.9 ml LV SV MOD A4C:     56.7 ml LV SV MOD BP:      21.9 ml RIGHT VENTRICLE            IVC RV Basal diam:  4.80 cm    IVC diam: 2.30 cm RV S prime:     9.79 cm/s TAPSE (M-mode): 2.1 cm LEFT ATRIUM              Index        RIGHT ATRIUM           Index LA diam:        4.30 cm  2.52 cm/m   RA Area:     23.00 cm LA Vol (A2C):   107.0 ml 62.70 ml/m  RA Volume:   77.30 ml  45.29 ml/m LA Vol (A4C):   68.5 ml  40.14 ml/m LA Biplane Vol: 90.7 ml  53.14 ml/m  AORTIC VALVE AV Area (Vmax):    1.21 cm AV Area (Vmean):   1.20 cm AV Area (VTI):     1.50 cm AV Vmax:           322.00 cm/s AV Vmean:          194.000 cm/s AV VTI:            0.462 m  AV Peak Grad:      41.5 mmHg AV Mean Grad:      19.0 mmHg LVOT Vmax:         124.00 cm/s LVOT Vmean:        74.100 cm/s LVOT VTI:          0.221 m LVOT/AV VTI ratio: 0.48  AORTA Ao Root diam: 3.20 cm MITRAL VALVE                TRICUSPID VALVE MV Area (PHT): 3.48 cm     TR Peak grad:   33.4 mmHg MV Decel Time: 218 msec     TR Vmax:        289.00 cm/s MV E velocity: 114.00 cm/s MV A velocity: 63.40 cm/s   SHUNTS MV E/A ratio:  1.80         Systemic VTI:  0.22 m                             Systemic Diam: 2.00 cm Rachelle Hora Croitoru MD Electronically signed by Thurmon Fair MD Signature Date/Time: 11/17/2023/9:21:13 AM  Final    EEG adult Result Date: 11/17/2023 Charlsie Quest, MD     11/17/2023  8:47 AM Patient Name: Duvid Smalls MRN: [id number] Epilepsy Attending: Charlsie Quest Referring Physician/Provider: Lurline Del, MD Date: 11/15/2022 Duration: 23.02 mins Patient history: 83yo M with ams and right sided weakness. EEG to evaluate for seizure Level of alertness: Awake AEDs during EEG study: None Technical aspects: This EEG study was done with scalp electrodes positioned according to the 10-20 International system of electrode placement. Electrical activity was reviewed with band pass filter of 1-70Hz , sensitivity of 7 uV/mm, display speed of 34mm/sec with a 60Hz  notched filter applied as appropriate. EEG data were recorded continuously and digitally stored.  Video monitoring was available and reviewed as appropriate. Description: The posterior dominant rhythm consists of 8  Hz activity of moderate voltage (25-35 uV) seen predominantly in posterior head regions, symmetric and reactive to eye opening and eye closing. EEG showed intermittent generalized 3 to 6 Hz theta-delta slowing. Hyperventilation and photic stimulation were not performed.   ABNORMALITY - Intermittent slow, generalized IMPRESSION: This study is suggestive of mild diffuse encephalopathy. No seizures or epileptiform discharges were  seen throughout the recording. Priyanka Annabelle Harman   US RENAL Result Date: 11/17/2023 CLINICAL DATA:  AKI EXAM: RENAL / URINARY TRACT ULTRASOUND COMPLETE COMPARISON:  None Available. FINDINGS: Right Kidney: Renal measurements: 8.4 x 5.5 x 3.7 cm = volume: 90 mL. Echogenicity within normal limits. No mass or hydronephrosis visualized. Left Kidney: Renal measurements: 9.4 x 5.0 x 3.9 cm = volume: 95 mL. Echogenicity within normal limits. No mass or hydronephrosis visualized. Bladder: Appears normal for degree of bladder distention. Prostate indents the bladder floor. Other: None. IMPRESSION: Unremarkable ultrasound of the kidneys. Electronically Signed   By: Minerva Fester M.D.   On: 11/17/2023 03:55   MR BRAIN WO CONTRAST Result Date: 11/17/2023 CLINICAL DATA:  Initial evaluation for neuro deficit, stroke suspected. EXAM: MRI HEAD WITHOUT CONTRAST MRA HEAD WITHOUT CONTRAST MRA NECK WITHOUT CONTRAST TECHNIQUE: Multiplanar, multiecho pulse sequences of the brain and surrounding structures were obtained without intravenous contrast. Angiographic images of the Circle of Willis were obtained using MRA technique without intravenous contrast. Angiographic images of the neck were obtained using MRA technique without intravenous contrast. Carotid stenosis measurements (when applicable) are obtained utilizing NASCET criteria, using the distal internal carotid diameter as the denominator. COMPARISON:  CT from 11/16/2023. FINDINGS: MRI HEAD FINDINGS Brain: Examination moderately degraded by motion artifact. Diffuse prominence of the CSF containing spaces compatible with generalized cerebral atrophy. Patchy T2/FLAIR hyperintensity involving the periventricular and deep white matter both cerebral hemispheres, consistent chronic small vessel ischemic disease, mild in nature. 6 mm focus of restricted diffusion involving the deep white matter of the left frontal centrum semi ovale, consistent with a small acute ischemic infarct  (series 5, image 39). No associated hemorrhage or mass effect. No other evidence for acute or subacute ischemia. Note made of mild diffusion signal abnormality involving the parasagittal right frontal lobe, felt to be consistent with artifact due to adjacent dural calcifications. Gray-white matter differentiation maintained. No acute intracranial hemorrhage. A small focus of chronic hemosiderin staining noted at the right occipital lobe (series 9, image 50). No mass lesion, midline shift or mass effect. No hydrocephalus or extra-axial fluid collection. Pituitary gland and suprasellar region within normal limits. Vascular: Major intracranial vascular flow voids are maintained. Skull and upper cervical spine: Craniocervical junction within normal limits. Bone marrow signal intensity normal. Small calcified nodular density  noted at the right frontal scalp, of doubtful significance. Sinuses/Orbits: Prior ocular lens replacement on the right. Paranasal sinuses are largely clear. Trace left mastoid effusion noted, of doubtful significance. Other: None. MRA HEAD FINDINGS ANTERIOR CIRCULATION: Examination moderately degraded by motion artifact. Both internal carotid arteries are patent through the siphons without significant stenosis or other abnormality. A1 segments patent bilaterally. Grossly normal anterior communicating artery complex. Both ACAs grossly patent without visible stenosis. No M1 stenosis. No visible proximal MCA branch occlusion. Distal MCA branches grossly perfused and symmetric. POSTERIOR CIRCULATION: Both V4 segments patent without stenosis. Left vertebral artery slightly dominant. Left PICA patent. Right PICA not seen. Basilar patent without stenosis. Superior cerebellar and posterior cerebral arteries patent bilaterally. No intracranial aneurysm. MRA NECK FINDINGS AORTIC ARCH: Examination moderately to severely degraded by motion artifact. Visualized aortic arch grossly within normal limits for  caliber. Origin of the great vessels not well assessed on this exam. RIGHT CAROTID SYSTEM: Right common and internal carotid arteries are patent with antegrade flow. No visible hemodynamically significant stenosis about the right carotid artery system. LEFT CAROTID SYSTEM: Left common and internal carotid arteries are patent with antegrade flow. No visible hemodynamically significant stenosis about the left carotid artery system. VERTEBRAL ARTERIES: Both vertebral arteries arise from subclavian arteries. Left vertebral artery dominant. Vertebral arteries are patent with antegrade flow. No visible hemodynamically significant stenosis. Other: None. IMPRESSION: MRI HEAD: 1. Motion degraded exam. 2. 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 3. Underlying cerebral atrophy with mild chronic small vessel ischemic disease. MRA HEAD: 1. Motion degraded exam. 2. Negative intracranial MRA for large vessel occlusion. No hemodynamically significant or correctable stenosis. MRA NECK: 1. Motion degraded exam. 2. Negative MRA of the neck for large vessel occlusion. No visible hemodynamically significant stenosis. Electronically Signed   By: Rise Mu M.D.   On: 11/17/2023 01:32   MR ANGIO HEAD WO CONTRAST Result Date: 11/17/2023 CLINICAL DATA:  Initial evaluation for neuro deficit, stroke suspected. EXAM: MRI HEAD WITHOUT CONTRAST MRA HEAD WITHOUT CONTRAST MRA NECK WITHOUT CONTRAST TECHNIQUE: Multiplanar, multiecho pulse sequences of the brain and surrounding structures were obtained without intravenous contrast. Angiographic images of the Circle of Willis were obtained using MRA technique without intravenous contrast. Angiographic images of the neck were obtained using MRA technique without intravenous contrast. Carotid stenosis measurements (when applicable) are obtained utilizing NASCET criteria, using the distal internal carotid diameter as the  denominator. COMPARISON:  CT from 11/16/2023. FINDINGS: MRI HEAD FINDINGS Brain: Examination moderately degraded by motion artifact. Diffuse prominence of the CSF containing spaces compatible with generalized cerebral atrophy. Patchy T2/FLAIR hyperintensity involving the periventricular and deep white matter both cerebral hemispheres, consistent chronic small vessel ischemic disease, mild in nature. 6 mm focus of restricted diffusion involving the deep white matter of the left frontal centrum semi ovale, consistent with a small acute ischemic infarct (series 5, image 39). No associated hemorrhage or mass effect. No other evidence for acute or subacute ischemia. Note made of mild diffusion signal abnormality involving the parasagittal right frontal lobe, felt to be consistent with artifact due to adjacent dural calcifications. Gray-white matter differentiation maintained. No acute intracranial hemorrhage. A small focus of chronic hemosiderin staining noted at the right occipital lobe (series 9, image 50). No mass lesion, midline shift or mass effect. No hydrocephalus or extra-axial fluid collection. Pituitary gland and suprasellar region within normal limits. Vascular: Major intracranial vascular flow voids are maintained. Skull and upper cervical  spine: Craniocervical junction within normal limits. Bone marrow signal intensity normal. Small calcified nodular density noted at the right frontal scalp, of doubtful significance. Sinuses/Orbits: Prior ocular lens replacement on the right. Paranasal sinuses are largely clear. Trace left mastoid effusion noted, of doubtful significance. Other: None. MRA HEAD FINDINGS ANTERIOR CIRCULATION: Examination moderately degraded by motion artifact. Both internal carotid arteries are patent through the siphons without significant stenosis or other abnormality. A1 segments patent bilaterally. Grossly normal anterior communicating artery complex. Both ACAs grossly patent without  visible stenosis. No M1 stenosis. No visible proximal MCA branch occlusion. Distal MCA branches grossly perfused and symmetric. POSTERIOR CIRCULATION: Both V4 segments patent without stenosis. Left vertebral artery slightly dominant. Left PICA patent. Right PICA not seen. Basilar patent without stenosis. Superior cerebellar and posterior cerebral arteries patent bilaterally. No intracranial aneurysm. MRA NECK FINDINGS AORTIC ARCH: Examination moderately to severely degraded by motion artifact. Visualized aortic arch grossly within normal limits for caliber. Origin of the great vessels not well assessed on this exam. RIGHT CAROTID SYSTEM: Right common and internal carotid arteries are patent with antegrade flow. No visible hemodynamically significant stenosis about the right carotid artery system. LEFT CAROTID SYSTEM: Left common and internal carotid arteries are patent with antegrade flow. No visible hemodynamically significant stenosis about the left carotid artery system. VERTEBRAL ARTERIES: Both vertebral arteries arise from subclavian arteries. Left vertebral artery dominant. Vertebral arteries are patent with antegrade flow. No visible hemodynamically significant stenosis. Other: None. IMPRESSION: MRI HEAD: 1. Motion degraded exam. 2. 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 3. Underlying cerebral atrophy with mild chronic small vessel ischemic disease. MRA HEAD: 1. Motion degraded exam. 2. Negative intracranial MRA for large vessel occlusion. No hemodynamically significant or correctable stenosis. MRA NECK: 1. Motion degraded exam. 2. Negative MRA of the neck for large vessel occlusion. No visible hemodynamically significant stenosis. Electronically Signed   By: Rise Mu M.D.   On: 11/17/2023 01:32   MR ANGIO NECK WO CONTRAST Result Date: 11/17/2023 CLINICAL DATA:  Initial evaluation for neuro deficit, stroke suspected. EXAM: MRI  HEAD WITHOUT CONTRAST MRA HEAD WITHOUT CONTRAST MRA NECK WITHOUT CONTRAST TECHNIQUE: Multiplanar, multiecho pulse sequences of the brain and surrounding structures were obtained without intravenous contrast. Angiographic images of the Circle of Willis were obtained using MRA technique without intravenous contrast. Angiographic images of the neck were obtained using MRA technique without intravenous contrast. Carotid stenosis measurements (when applicable) are obtained utilizing NASCET criteria, using the distal internal carotid diameter as the denominator. COMPARISON:  CT from 11/16/2023. FINDINGS: MRI HEAD FINDINGS Brain: Examination moderately degraded by motion artifact. Diffuse prominence of the CSF containing spaces compatible with generalized cerebral atrophy. Patchy T2/FLAIR hyperintensity involving the periventricular and deep white matter both cerebral hemispheres, consistent chronic small vessel ischemic disease, mild in nature. 6 mm focus of restricted diffusion involving the deep white matter of the left frontal centrum semi ovale, consistent with a small acute ischemic infarct (series 5, image 39). No associated hemorrhage or mass effect. No other evidence for acute or subacute ischemia. Note made of mild diffusion signal abnormality involving the parasagittal right frontal lobe, felt to be consistent with artifact due to adjacent dural calcifications. Gray-white matter differentiation maintained. No acute intracranial hemorrhage. A small focus of chronic hemosiderin staining noted at the right occipital lobe (series 9, image 50). No mass lesion, midline shift or mass effect. No hydrocephalus or extra-axial fluid collection. Pituitary gland and suprasellar region  within normal limits. Vascular: Major intracranial vascular flow voids are maintained. Skull and upper cervical spine: Craniocervical junction within normal limits. Bone marrow signal intensity normal. Small calcified nodular density noted at  the right frontal scalp, of doubtful significance. Sinuses/Orbits: Prior ocular lens replacement on the right. Paranasal sinuses are largely clear. Trace left mastoid effusion noted, of doubtful significance. Other: None. MRA HEAD FINDINGS ANTERIOR CIRCULATION: Examination moderately degraded by motion artifact. Both internal carotid arteries are patent through the siphons without significant stenosis or other abnormality. A1 segments patent bilaterally. Grossly normal anterior communicating artery complex. Both ACAs grossly patent without visible stenosis. No M1 stenosis. No visible proximal MCA branch occlusion. Distal MCA branches grossly perfused and symmetric. POSTERIOR CIRCULATION: Both V4 segments patent without stenosis. Left vertebral artery slightly dominant. Left PICA patent. Right PICA not seen. Basilar patent without stenosis. Superior cerebellar and posterior cerebral arteries patent bilaterally. No intracranial aneurysm. MRA NECK FINDINGS AORTIC ARCH: Examination moderately to severely degraded by motion artifact. Visualized aortic arch grossly within normal limits for caliber. Origin of the great vessels not well assessed on this exam. RIGHT CAROTID SYSTEM: Right common and internal carotid arteries are patent with antegrade flow. No visible hemodynamically significant stenosis about the right carotid artery system. LEFT CAROTID SYSTEM: Left common and internal carotid arteries are patent with antegrade flow. No visible hemodynamically significant stenosis about the left carotid artery system. VERTEBRAL ARTERIES: Both vertebral arteries arise from subclavian arteries. Left vertebral artery dominant. Vertebral arteries are patent with antegrade flow. No visible hemodynamically significant stenosis. Other: None. IMPRESSION: MRI HEAD: 1. Motion degraded exam. 2. 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 3. Underlying cerebral atrophy with  mild chronic small vessel ischemic disease. MRA HEAD: 1. Motion degraded exam. 2. Negative intracranial MRA for large vessel occlusion. No hemodynamically significant or correctable stenosis. MRA NECK: 1. Motion degraded exam. 2. Negative MRA of the neck for large vessel occlusion. No visible hemodynamically significant stenosis. Electronically Signed   By: Rise Mu M.D.   On: 11/17/2023 01:32   CT Head Wo Contrast Result Date: 11/16/2023 CLINICAL DATA:  Neuro deficit, acute, stroke suspected right sided weakness EXAM: CT HEAD WITHOUT CONTRAST TECHNIQUE: Contiguous axial images were obtained from the base of the skull through the vertex without intravenous contrast. RADIATION DOSE REDUCTION: This exam was performed according to the departmental dose-optimization program which includes automated exposure control, adjustment of the mA and/or kV according to patient size and/or use of iterative reconstruction technique. COMPARISON:  None Available. FINDINGS: Brain: Normal anatomic configuration. Parenchymal volume loss is commensurate with the patient's age. Mild periventricular white matter changes are present likely reflecting the sequela of small vessel ischemia. No abnormal intra or extra-axial mass lesion or fluid collection. No abnormal mass effect or midline shift. No evidence of acute intracranial hemorrhage or infarct. Ventricular size is normal. Cerebellum unremarkable. Vascular: No asymmetric hyperdense vasculature at the skull base. Skull: Intact Sinuses/Orbits: Paranasal sinuses are clear. Orbits are unremarkable. Other: Mastoid air cells and middle ear cavities are clear. IMPRESSION: 1. No acute intracranial hemorrhage or infarct. 2. Mild senescent change. Electronically Signed   By: Helyn Numbers M.D.   On: 11/16/2023 20:51   DG Chest Portable 1 View Result Date: 11/16/2023 CLINICAL DATA:  Aspiration evaluation. EXAM: PORTABLE CHEST 1 VIEW COMPARISON:  07/02/2022 FINDINGS: Prior median  sternotomy and CABG. Mild cardiomegaly. Mediastinal contours within normal limits. Aortic atherosclerosis. No confluent airspace opacities or effusions. No  acute bony abnormality. IMPRESSION: Cardiomegaly.  No active disease. Electronically Signed   By: Charlett Nose M.D.   On: 11/16/2023 20:02    Microbiology: Results for orders placed or performed during the hospital encounter of 11/16/23  Blood culture (routine x 2)     Status: Abnormal   Collection Time: 11/16/23  9:08 PM   Specimen: BLOOD  Result Value Ref Range Status   Specimen Description BLOOD right neck  Final   Special Requests   Final    BOTTLES DRAWN AEROBIC AND ANAEROBIC Blood Culture adequate volume   Culture  Setup Time   Final    GRAM POSITIVE COCCI IN CLUSTERS IN BOTH AEROBIC AND ANAEROBIC BOTTLES CRITICAL RESULT CALLED TO, READ BACK BY AND VERIFIED WITH: PHARMD E. PAYTES [id number] @ 1638 FH    Culture (A)  Final    STAPHYLOCOCCUS EPIDERMIDIS THE SIGNIFICANCE OF ISOLATING THIS ORGANISM FROM A SINGLE SET OF BLOOD CULTURES WHEN MULTIPLE SETS ARE DRAWN IS UNCERTAIN. PLEASE NOTIFY THE MICROBIOLOGY DEPARTMENT WITHIN ONE WEEK IF SPECIATION AND SENSITIVITIES ARE REQUIRED. Performed at [facility], [address] [place], [place] [address]    Report Status 11/19/2023 FINAL  Final  Blood Culture ID Panel (Reflexed)     Status: Abnormal   Collection Time: 11/16/23  9:08 PM  Result Value Ref Range Status   Enterococcus faecalis NOT DETECTED NOT DETECTED Final   Enterococcus Faecium NOT DETECTED NOT DETECTED Final   Listeria monocytogenes NOT DETECTED NOT DETECTED Final   Staphylococcus species DETECTED (A) NOT DETECTED Final    Comment: CRITICAL RESULT CALLED TO, READ BACK BY AND VERIFIED WITH: PHARMD E. PAYTES [id number] @ 1638 FH    Staphylococcus aureus (BCID) NOT DETECTED NOT DETECTED Final   Staphylococcus epidermidis DETECTED (A) NOT DETECTED Final    Comment: CRITICAL RESULT CALLED TO, READ BACK BY AND VERIFIED  WITH: PHARMD E. PAYTES [id number] @ 1638 FH    Staphylococcus lugdunensis NOT DETECTED NOT DETECTED Final   Streptococcus species NOT DETECTED NOT DETECTED Final   Streptococcus agalactiae NOT DETECTED NOT DETECTED Final   Streptococcus pneumoniae NOT DETECTED NOT DETECTED Final   Streptococcus pyogenes NOT DETECTED NOT DETECTED Final   A.calcoaceticus-baumannii NOT DETECTED NOT DETECTED Final   Bacteroides fragilis NOT DETECTED NOT DETECTED Final   Enterobacterales NOT DETECTED NOT DETECTED Final   Enterobacter cloacae complex NOT DETECTED NOT DETECTED Final   Escherichia coli NOT DETECTED NOT DETECTED Final   Klebsiella aerogenes NOT DETECTED NOT DETECTED Final   Klebsiella oxytoca NOT DETECTED NOT DETECTED Final   Klebsiella pneumoniae NOT DETECTED NOT DETECTED Final   Proteus species NOT DETECTED NOT DETECTED Final   Salmonella species NOT DETECTED NOT DETECTED Final   Serratia marcescens NOT DETECTED NOT DETECTED Final   Haemophilus influenzae NOT DETECTED NOT DETECTED Final   Neisseria meningitidis NOT DETECTED NOT DETECTED Final   Pseudomonas aeruginosa NOT DETECTED NOT DETECTED Final   Stenotrophomonas maltophilia NOT DETECTED NOT DETECTED Final   Candida albicans NOT DETECTED NOT DETECTED Final   Candida auris NOT DETECTED NOT DETECTED Final   Candida glabrata NOT DETECTED NOT DETECTED Final   Candida krusei NOT DETECTED NOT DETECTED Final   Candida parapsilosis NOT DETECTED NOT DETECTED Final   Candida tropicalis NOT DETECTED NOT DETECTED Final   Cryptococcus neoformans/gattii NOT DETECTED NOT DETECTED Final   Methicillin resistance mecA/C NOT DETECTED NOT DETECTED Final    Comment: Performed at [facility], [address], [place], [place] [address]  Blood culture (routine x 2)     Status: None   Collection Time: 11/16/23  9:40 PM   Specimen: BLOOD RIGHT FOREARM  Result Value Ref Range Status   Specimen Description BLOOD RIGHT FOREARM  Final   Special Requests    Final    BOTTLES DRAWN AEROBIC ONLY Blood Culture results may not be optimal due to an inadequate volume of blood received in culture bottles   Culture   Final    NO GROWTH 5 DAYS Performed at [facility], [address] [place], [place] [address]    Report Status 11/21/2023 FINAL  Final    Labs: CBC: Recent Labs  Lab 11/21/23 1355 11/22/23 0648 11/23/23 0133 11/24/23 0541 11/25/23 0551 11/26/23 0636  WBC 11.1* 13.6* 12.9* 11.1* 9.5 9.9  NEUTROABS 8.6*  --   --   --   --   --   HGB 12.0* 12.6* 10.7* 11.4* 10.1* 10.3*  HCT 37.9* 38.9* 33.1* 34.0* 30.9* 32.2*  MCV 99.2 97.3 97.1 95.2 96.9 97.3  PLT 170 166 169 148* 158 189   Basic Metabolic Panel: Recent Labs  Lab 11/22/23 0648 11/22/23 1044 11/23/23 0133 11/23/23 0826 11/23/23 1410 11/23/23 1949 11/24/23 0541 11/25/23 0551 11/26/23 0636  NA 154*   < > 145 147* 145 145  --  144 144  K 3.6   < > 3.0* 2.9* 3.2* 3.6  --  3.4* 4.5  CL 120*   < > 112* 111 109 113*  --  113* 115*  CO2 19*   < > 24 23 21* 22  --  21* 21*  GLUCOSE 152*   < > 181* 167* 227* 238*  --  157* 169*  BUN 31*   < > 30* 26* 29* 37*  --  29* 26*  CREATININE 1.78*   < > 1.42* 1.40* 1.70* 1.99*  --  2.03* 1.77*  CALCIUM 8.7*   < > 8.2* 8.6* 8.7* 8.4*  --  8.0* 8.3*  MG 1.9  --  1.7  --   --   --  1.7 1.6* 2.1  PHOS 2.8  --  2.9  --   --   --  2.5 3.1 3.2   < > = values in this interval not displayed.   Liver Function Tests: Recent Labs  Lab 11/21/23 1355  AST 40  ALT 51*  ALKPHOS 54  BILITOT 1.7*  PROT 6.5  ALBUMIN 2.9*   CBG: No results for input(s): "GLUCAP" in the last 168 hours.  Discharge time spent: greater than 30 minutes.  Signed: Briant Cedar, MD [facility] 11/27/2023

## 2024-01-25 ENCOUNTER — Other Ambulatory Visit: Payer: Self-pay

## 2024-01-25 MED ORDER — CLOPIDOGREL BISULFATE 75 MG PO TABS: 75.0000 mg | ORAL_TABLET | Freq: Every day | ORAL | 1 refills | Status: DC

## 2024-01-25 MED ORDER — PANTOPRAZOLE SODIUM 40 MG PO TBEC: 40.0000 mg | DELAYED_RELEASE_TABLET | Freq: Every day | ORAL | 1 refills | Status: DC

## 2024-02-17 LAB — LAB REPORT - SCANNED
Albumin, Urine POC: 59.6
Albumin/Creatinine Ratio, Urine, POC: 71
Creatinine, POC: 84.4 mg/dL
EGFR: 41

## 2024-08-05 ENCOUNTER — Other Ambulatory Visit: Payer: Self-pay | Admitting: Cardiology

## 2024-09-07 ENCOUNTER — Other Ambulatory Visit: Payer: Self-pay | Admitting: Cardiology

## 2024-09-29 ENCOUNTER — Telehealth: Payer: Self-pay | Admitting: Cardiology

## 2024-10-04 NOTE — Telephone Encounter (Signed)
 Pt scheduled 11/18/24

## 2024-10-05 ENCOUNTER — Other Ambulatory Visit: Payer: Self-pay | Admitting: Cardiology

## 2024-10-21 ENCOUNTER — Other Ambulatory Visit: Payer: Self-pay | Admitting: Cardiology

## 2024-10-31 ENCOUNTER — Other Ambulatory Visit: Payer: Self-pay | Admitting: Cardiology

## 2024-11-07 ENCOUNTER — Other Ambulatory Visit: Payer: Self-pay

## 2024-11-10 ENCOUNTER — Other Ambulatory Visit: Payer: Self-pay | Admitting: Cardiology

## 2024-11-15 LAB — LAB REPORT - SCANNED: EGFR: 30

## 2024-11-17 NOTE — Progress Notes (Signed)
 " Cardiology Office Note:   Date:  11/20/2024  ID:  Steve, Hickman 1941/02/14, MRN [id number] PCP: Jefferey Fitch, MD  [facility] Providers Cardiologist:  Lynwood Schilling, MD {  History of Present Illness:   Steve Hickman is a 84 y.o. male who presents for follow up of CAD.   He had an inferior STEMI 05/28/13.  He was seen and evaluated and taken emergently to the cath lab. He underwent PCI with BMS to proximal codominant LCX. He had residual disease and later underwent elective CABG X 2 with LIMA-LAD, SVG-PD on 07/07/13 by Dr Kerrin. Post op he had PAF briefly treated with Amiodarone , he has not had recurrence. Echo 07/09/13 showed normal LVF-EF 55-60%.     Since I last saw him he was in the hospital in Jan 2024.  He had an acute ischemic stroke.  He had a NSTEMI but was managed medically.  I have not seen him since September 2024.  I see that he was in the hospital in January 2025 with an acute ischemic stroke.  He was sent home on aspirin  and Plavix .  He had encephalopathy, hyponatremia, hypokalemia, probable sepsis, AKI.  Enzymes were elevated.  His EF was well-preserved to 65 to 70%.  He was noted to have diabetes, anemia he was sent to rehab but now is back home living by himself.  He still drives though his license is expired.  He eats out at [employer] once a day.  He does not really know his medications.  He is fatigued but he is not having any acute complaints.  He denies any chest pressure, neck or arm discomfort.  Today he is in atrial flutter and he would not notice this.  He does not feel palpitations and has had no presyncope or syncope.  ROS: As stated in the HPI and negative for all other systems.  Studies Reviewed:    EKG:   EKG Interpretation Date/Time:  Friday November 18 2024 12:13:51 EST Ventricular Rate:  39 PR Interval:    QRS Duration:  116 QT Interval:  466 QTC Calculation: 375 R Axis:   65  Text Interpretation: Atrial flutter Incomplete  right bundle branch block Nonspecific ST abnormality When compared with ECG of 22-Nov-2023 11:53, Atrial flutter noted.  (Previosy EKG likely flutter as well. ) Confirmed by Schilling Rattan ([id number]) on 11/18/2024 12:37:07 PM     Risk Assessment/Calculations:    CHA2DS2-VASc Score = 7   This indicates a 11.2% annual risk of stroke. The patient's score is based upon: CHF History: 0 HTN History: 1 Diabetes History: 1 Stroke History: 2 Vascular Disease History: 1 Age Score: 2 Gender Score: 0     Physical Exam:   VS:  BP (!) 160/72   Pulse (!) 39   Ht 5' 5 (1.651 m)   Wt 132 lb (59.9 kg)   SpO2 98%   BMI 21.97 kg/m    Wt Readings from Last 3 Encounters:  11/18/24 132 lb (59.9 kg)  11/17/23 141 lb 5 oz (64.1 kg)  07/15/23 138 lb 12.8 oz (63 kg)     GEN: Well nourished, well developed in no acute distress NECK: No JVD; No carotid bruits CARDIAC: RRR,  soft systolic murmur, no diastolic murmurs, rubs, gallops RESPIRATORY:  Clear to auscultation without rales, wheezing or rhonchi  ABDOMEN: Soft, non-tender, non-distended EXTREMITIES:  No edema; No deformity   ASSESSMENT AND PLAN:   Hx of CABG:  He has no ongoing chest pain.  He will continue with attempts at risk reduction.  LBBB (left bundle branch block):   This has been chronic.  No change in therapy.  Dyslipidemia: LDL was 20 in January 2025.  No change in therapy.   HTN: His blood pressure is elevated.  He is unable to take his blood pressure readings at home.  I am going to try to get a home health nurse involved.  Med choices are limited because of his bradycardia and renal insufficiency.  Today in the absence of further readings I am not going to adjust his meds.    DM: A1c was 7.3.  He will try to get follow-up with his primary.  Mild aortic valve calcium : He does not have any significant valvular disease and no change in therapy.  CKD IIIA: He is followed by nephrology.  I do not have the most recent labs.  I  will check a CBC and a basic metabolic profile.  Atrial flutter: This is a new finding.  He does not recognize this.  He has a slow rate.  I am and stop his beta-blocker.  And get a 3-day ZIO.  I am going to go ahead and start Eliquis  2.5 mg twice daily and have him stop his aspirin  and Plavix .  He has a very high CHA2DS2-VASc risk score and high thromboembolic risk.  He has no recent GI bleeding.  Because I cannot see the most recent labs I will get a CBC and a basic metabolic profile.  I am sure that he is going to need help putting this on at home.    Social: This patient is at high risk for adverse outcomes because of social isolation and the complexity of his illness.  Will going to get social worker involved to try and see if we can get home health nursing who can at least reviewed his meds, check blood pressures and facilitate transportation.   I did speak with Jefferey Fitch, MD who does not recall this patient but will look into helping us  with his social situation.       Follow up with APP in two weeks.   Signed, Lynwood Schilling, MD   "

## 2024-11-18 ENCOUNTER — Ambulatory Visit: Attending: Cardiology | Admitting: Cardiology

## 2024-11-18 ENCOUNTER — Ambulatory Visit

## 2024-11-18 ENCOUNTER — Encounter: Payer: Self-pay | Admitting: Cardiology

## 2024-11-18 VITALS — BP 160/72 | HR 39 | Ht 65.0 in | Wt 132.0 lb

## 2024-11-18 DIAGNOSIS — I350 Nonrheumatic aortic (valve) stenosis: Secondary | ICD-10-CM

## 2024-11-18 DIAGNOSIS — I10 Essential (primary) hypertension: Secondary | ICD-10-CM | POA: Diagnosis not present

## 2024-11-18 DIAGNOSIS — R4182 Altered mental status, unspecified: Secondary | ICD-10-CM

## 2024-11-18 DIAGNOSIS — I4892 Unspecified atrial flutter: Secondary | ICD-10-CM

## 2024-11-18 DIAGNOSIS — I2510 Atherosclerotic heart disease of native coronary artery without angina pectoris: Secondary | ICD-10-CM | POA: Diagnosis not present

## 2024-11-18 LAB — CBC
Hematocrit: 34.4 % — ABNORMAL LOW (ref 37.5–51.0)
Hemoglobin: 11 g/dL — ABNORMAL LOW (ref 13.0–17.7)
MCH: 31.1 pg (ref 26.6–33.0)
MCHC: 32 g/dL (ref 31.5–35.7)
MCV: 97 fL (ref 79–97)
Platelets: 197 x10E3/uL (ref 150–450)
RBC: 3.54 x10E6/uL — ABNORMAL LOW (ref 4.14–5.80)
RDW: 12.1 % (ref 11.6–15.4)
WBC: 9.1 x10E3/uL (ref 3.4–10.8)

## 2024-11-18 LAB — BASIC METABOLIC PANEL WITH GFR
BUN/Creatinine Ratio: 11 (ref 10–24)
BUN: 23 mg/dL (ref 8–27)
CO2: 22 mmol/L (ref 20–29)
Calcium: 9.4 mg/dL (ref 8.6–10.2)
Chloride: 105 mmol/L (ref 96–106)
Creatinine, Ser: 2.01 mg/dL — ABNORMAL HIGH (ref 0.76–1.27)
Glucose: 111 mg/dL — ABNORMAL HIGH (ref 70–99)
Potassium: 5.2 mmol/L (ref 3.5–5.2)
Sodium: 142 mmol/L (ref 134–144)
eGFR: 32 mL/min/1.73 — ABNORMAL LOW

## 2024-11-18 MED ORDER — APIXABAN 2.5 MG PO TABS: 2.5000 mg | ORAL_TABLET | Freq: Two times a day (BID) | ORAL | 3 refills | Status: AC

## 2024-11-18 NOTE — Progress Notes (Unsigned)
 Applied a 3 day ZIo XT to patient in the office

## 2024-11-18 NOTE — Patient Instructions (Addendum)
 Medication Instructions:  STOP Plavix  STOP Aspirin  STOP Metoprolol  Tartrate START Eliquis 2.5 mg twice daily *If you need a refill on your cardiac medications before your next appointment, please call your pharmacy*  Lab Work: CBC, BMET today at [facility] If you have labs (blood work) drawn today and your tests are completely normal, you will receive your results only by: MyChart Message (if you have MyChart) OR A paper copy in the mail If you have any lab test that is abnormal or we need to change your treatment, we will call you to review the results.  Testing/Procedures: 3 Day Zio Heart Monitor Your physician has requested that you wear a Zio heart monitor for __3___ days. This will be mailed to your home with instructions on how to apply the monitor and how to return it when finished. Please allow 2 weeks after returning the heart monitor before our office calls you with the results.   Follow-Up: At [facility], you and your health needs are our priority.  As part of our continuing mission to provide you with exceptional heart care, our providers are all part of one team.  This team includes your primary Cardiologist (physician) and Advanced Practice Providers or APPs (Physician Assistants and Nurse Practitioners) who all work together to provide you with the care you need, when you need it.  Your next appointment:   2 week(s)  Provider:   Lynwood Schilling, MD    We recommend signing up for the patient portal called MyChart.  Sign up information is provided on this After Visit Summary.  MyChart is used to connect with patients for Virtual Visits (Telemedicine).  Patients are able to view lab/test results, encounter notes, upcoming appointments, etc.  Non-urgent messages can be sent to your provider as well.   To learn more about what you can do with MyChart, go to [email or web address].   Other Instructions  Social work referral placed. Someone will reach out to  you.  ZIO XT- Long Term Monitor Instructions  Your physician has requested you wear a ZIO patch monitor for 3 days.  This is a single patch monitor. [employer] supplies one patch monitor per enrollment. Additional stickers are not available. Please do not apply patch if you will be having a Nuclear Stress Test,  Echocardiogram, Cardiac CT, MRI, or Chest Xray during the period you would be wearing the  monitor. The patch cannot be worn during these tests. You cannot remove and re-apply the  ZIO XT patch monitor.  Your ZIO patch monitor will be mailed 3 day USPS to your address on file. It may take 3-5 days  to receive your monitor after you have been enrolled.  Once you have received your monitor, please review the enclosed instructions. Your monitor  has already been registered assigning a specific monitor serial # to you.  Billing and Patient Assistance Program Information  We have supplied [employer] with any of your insurance information on file for billing purposes. [employer] offers a sliding scale Patient Assistance Program for patients that do not have  insurance, or whose insurance does not completely cover the cost of the ZIO monitor.  You must apply for the Patient Assistance Program to qualify for this discounted rate.  To apply, please call [employer] at [phone], select option 4, select option 2, ask to apply for  Patient Assistance Program. Meredeth will ask your household income, and how many people  are in your household. They will quote your out-of-pocket cost based on that information.  [employer] will also be able to set up a 74-month, interest-free payment plan if needed.  Applying the monitor   Shave hair from upper left chest.  Hold abrader disc by orange tab. Rub abrader in 40 strokes over the upper left chest as  indicated in your monitor instructions.  Clean area with 4 enclosed alcohol  pads. Let dry.  Apply patch as indicated in monitor instructions. Patch will be placed  under collarbone on left  side of chest with arrow pointing upward.  Rub patch adhesive wings for 2 minutes. Remove white label marked 1. Remove the white  label marked 2. Rub patch adhesive wings for 2 additional minutes.  While looking in a mirror, press and release button in center of patch. A small green light will  flash 3-4 times. This will be your only indicator that the monitor has been turned on.  Do not shower for the first 24 hours. You may shower after the first 24 hours.  Press the button if you feel a symptom. You will hear a small click. Record Date, Time and  Symptom in the Patient Logbook.  When you are ready to remove the patch, follow instructions on the last 2 pages of Patient  Logbook. Stick patch monitor onto the last page of Patient Logbook.  Place Patient Logbook in the blue and white box. Use locking tab on box and tape box closed  securely. The blue and white box has prepaid postage on it. Please place it in the mailbox as  soon as possible. Your physician should have your test results approximately 7 days after the  monitor has been mailed back to [facility].  Call [facility] Customer Care at [phone] if you have questions regarding  your ZIO XT patch monitor. Call them immediately if you see an orange light blinking on your  monitor.  If your monitor falls off in less than 4 days, contact our Monitor department at [phone].  If your monitor becomes loose or falls off after 4 days call [employer] at [phone] for  suggestions on securing your monitor

## 2024-11-20 ENCOUNTER — Encounter: Payer: Self-pay | Admitting: Cardiology

## 2024-11-21 ENCOUNTER — Telehealth: Payer: Self-pay | Admitting: Pharmacist Clinician (PhC)/ Clinical Pharmacy Specialist

## 2024-11-21 NOTE — Telephone Encounter (Signed)
 Patient may benefit from adherence packaging.  Was not available to talk when I called earlier

## 2024-11-21 NOTE — Telephone Encounter (Signed)
-----   Message from Nurse Aleck RAMAN, RN sent at 11/18/2024  1:11 PM EST ----- Hello, Dr. Lavona would like for someone to reach out to this pt to go over his medications with him. If you have any questions please don't hesitate to reach out to Lavona armin CHANETA Aleck, RN

## 2024-11-22 ENCOUNTER — Telehealth: Payer: Self-pay | Admitting: Licensed Clinical Social Worker

## 2024-11-22 NOTE — Progress Notes (Signed)
 " Heart and Vascular Care Navigation  11/22/2024  Steve Hickman [birth year] [id number]  Reason for Referral: community resources/support at home   Engaged with patient by telephone for initial visit for Heart and Vascular Care Coordination.                                                                                                   Assessment:                                     LCSW received a return call from pt today. Introduced self, role, reason for call. Confirmed home address, PCP is [facility] but pt admits he hasn't gone lately. He resides alone, does have supportive family (ex wife and her children) but they don't live nearby. Currently is staying with them due to bad weather, usually relies on his neighbor to take him to and from appts. Drives himself around town for food/errands etc but acknowledges this isn't always safe as he is waiting for an eye procedure to renew his expired license- shares [facility] [facility] may be better for him to use for Steve Hickman moving forward. Denies any issues with access to food, utilities or housing. Able to obtain and afford his medications without issue. Pt did not meet criteria for home health nursing. Will send him some information about [facility] to see if maybe their services could help if interested. Pt also aware that pharmacy team may call him regarding f/u visit. He is amenable to me asking about [facility] for f/u visits moving forward.    HRT/VAS Care Coordination     Patients Home Cardiology Office [facility]   Outpatient Care Team Social Worker   Social Worker Name: Steve Hickman, [place], [phone]   Living arrangements for the past 2 months Single Family Home   Lives with: Self   Patient Current Insurance Coverage Managed Medicare   Patient Has Concern With Paying Medical Bills No   Does Patient Have Prescription Coverage? Yes   Home Assistive  Devices/Equipment Eyeglasses; CBG Meter       Social History:                                                                             SDOH Screenings   Food Insecurity: No Food Insecurity (11/22/2024)  Housing: Low Risk (11/22/2024)  Transportation Needs: No Transportation Needs (11/22/2024)  Utilities: Not At Risk (11/22/2024)  Financial Resource Strain: Low Risk (11/22/2024)  Social Connections: Unknown (03/11/2022)   Received from [facility]  Tobacco Use: Low Risk (11/20/2024)  Health Literacy: Adequate Health Literacy (11/22/2024)    SDOH Interventions: Financial Resources:  Financial Strain Interventions:  Intervention Not Indicated  Food Insecurity:  Food Insecurity Interventions: Intervention Not Indicated  Housing Insecurity:  Housing Interventions: Intervention Not Indicated  Transportation:   Transportation Interventions: Intervention Not Indicated, Patient Resources (Friends/Family), Community Resources Provided     Other Care Navigation Interventions:     Provided Pharmacy assistance resources  Pt denies any issues obtaining medications- discussed that he should get a call from pharmacy at updated number to schedule potential f/u visit  Patient expressed Mental Health concerns No.  Patient Referred to: Steve Hickman   Follow-up plan:   LCSW will send pt community resources for transportation, information on how to schedule with PCP clinic, and other community resource support through Steve Hickman.  Will also send my card to ensure pt has received resources, sent updated number for pt to pharmacy team. Pt had completed DPR last year but number was never updated in chart. Remain available as needed.    "

## 2024-11-22 NOTE — Telephone Encounter (Signed)
 H&V Care Navigation CSW Progress Note  Clinical Social Worker contacted patient by phone to f/u on community resources/in home concerns. The number listed for pt [phone] goes to a Spanish language speaker who shares that is not the right number. LCSW then called pt ex wife Steve Hickman as DPR on file. Reached a Steve Hickman at that number [phone]) who states she is Steve Hickman's daughter. Pt currently still resting she will ask him to give me a call back.  Patient is participating in a Managed Medicaid Plan:  No, UHC Medicare Dual Complete  SDOH Screenings   Food Insecurity: Low Risk (11/10/2023)   Received from [facility]  Housing: Low Risk (11/10/2023)   Received from [facility]  Transportation Needs: No Transportation Needs (11/10/2023)   Received from [facility]  Utilities: Low Risk (11/10/2023)   Received from [facility]  Social Connections: Unknown (03/11/2022)   Received from [facility]  Tobacco Use: Low Risk (11/20/2024)    Marit Lark, MSW, LCSW Clinical Social Worker II [facility] Heart/Vascular Care Navigation  [phone]- work cell phone (preferred)

## 2024-11-28 NOTE — Telephone Encounter (Signed)
 I will be in [place] on 2/11, pt has appointment with Dr. Liborio that day.  Will speak to him in the office about adherence packaging

## 2024-12-01 ENCOUNTER — Ambulatory Visit: Payer: Self-pay | Admitting: Cardiology

## 2024-12-01 DIAGNOSIS — I4892 Unspecified atrial flutter: Secondary | ICD-10-CM

## 2024-12-02 ENCOUNTER — Telehealth: Payer: Self-pay | Admitting: Cardiology

## 2024-12-02 NOTE — Telephone Encounter (Signed)
"  ° °  Cardiac Monitor Alert  Date of alert:  12/02/2024   Patient Name: Steve Hickman  DOB: 23-Nov-1940  MRN: [id number]   [facility] Cardiologist: Lynwood Schilling, MD  [facility] EP:  None    Monitor Information: Long Term Monitor [ZioXT]  Reason:  Atrial flutter Ordering provider:  Dr Schilling   Alert Atrial Fibrillation/Flutter This is the 1st alert for this rhythm.  The patient has a hx of Atrial Fibrillation/Flutter.    Anticoagulation medication as of 12/02/2024           apixaban  (ELIQUIS ) 2.5 MG TABS tablet Take 1 tablet (2.5 mg total) by mouth 2 (two) times daily.       Next Cardiology Appointment   Date:  2/11   Provider: Dr Madireddy   Other: Slow atrial flutter 31 bpm, lasting 1 min 11/19/24 at 11:16 am   End of summary report already reviewed by Provider as of 12/01/2024. Appt also already made.    Bethena Powell SAUNDERS, RN  12/02/2024 10:34 AM     "

## 2024-12-02 NOTE — Telephone Encounter (Signed)
 Calling with Abn results

## 2024-12-02 NOTE — Telephone Encounter (Signed)
 They hung up before I could get them transfered

## 2024-12-07 ENCOUNTER — Ambulatory Visit
